# Patient Record
Sex: Female | Born: 1985 | Race: Black or African American | Hispanic: No | Marital: Single | Smoking: Never smoker
Health system: Southern US, Community
[De-identification: ages and names within clinical notes are randomized; demographics above are authoritative.]

## PROBLEM LIST (undated history)

## (undated) DIAGNOSIS — F419 Anxiety disorder, unspecified: Secondary | ICD-10-CM

## (undated) DIAGNOSIS — I10 Essential (primary) hypertension: Secondary | ICD-10-CM

## (undated) DIAGNOSIS — K219 Gastro-esophageal reflux disease without esophagitis: Secondary | ICD-10-CM

## (undated) DIAGNOSIS — A549 Gonococcal infection, unspecified: Secondary | ICD-10-CM

## (undated) DIAGNOSIS — R7303 Prediabetes: Secondary | ICD-10-CM

## (undated) DIAGNOSIS — E119 Type 2 diabetes mellitus without complications: Secondary | ICD-10-CM

## (undated) DIAGNOSIS — E669 Obesity, unspecified: Secondary | ICD-10-CM

## (undated) DIAGNOSIS — N390 Urinary tract infection, site not specified: Secondary | ICD-10-CM

## (undated) DIAGNOSIS — R079 Chest pain, unspecified: Secondary | ICD-10-CM

## (undated) DIAGNOSIS — L0291 Cutaneous abscess, unspecified: Secondary | ICD-10-CM

## (undated) DIAGNOSIS — D649 Anemia, unspecified: Secondary | ICD-10-CM

## (undated) HISTORY — PX: TONSILLECTOMY: SUR1361

## (undated) HISTORY — DX: Morbid (severe) obesity due to excess calories: E66.01

## (undated) HISTORY — PX: DIAGNOSTIC LAPAROSCOPY: SUR761

---
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
## 2006-01-14 ENCOUNTER — Emergency Department (HOSPITAL_COMMUNITY): Admission: EM | Admit: 2006-01-14 | Discharge: 2006-01-14 | Payer: Self-pay | Admitting: Emergency Medicine

## 2006-04-14 ENCOUNTER — Emergency Department (HOSPITAL_COMMUNITY): Admission: EM | Admit: 2006-04-14 | Discharge: 2006-04-14 | Payer: Self-pay | Admitting: Emergency Medicine

## 2006-09-13 ENCOUNTER — Emergency Department (HOSPITAL_COMMUNITY): Admission: EM | Admit: 2006-09-13 | Discharge: 2006-09-13 | Payer: Self-pay | Admitting: Emergency Medicine

## 2007-01-08 ENCOUNTER — Emergency Department (HOSPITAL_COMMUNITY): Admission: EM | Admit: 2007-01-08 | Discharge: 2007-01-08 | Payer: Self-pay | Admitting: Emergency Medicine

## 2007-04-29 ENCOUNTER — Emergency Department (HOSPITAL_COMMUNITY): Admission: EM | Admit: 2007-04-29 | Discharge: 2007-04-29 | Payer: Self-pay | Admitting: Emergency Medicine

## 2007-05-11 ENCOUNTER — Emergency Department (HOSPITAL_COMMUNITY): Admission: EM | Admit: 2007-05-11 | Discharge: 2007-05-11 | Payer: Self-pay | Admitting: Emergency Medicine

## 2007-08-19 ENCOUNTER — Emergency Department (HOSPITAL_COMMUNITY): Admission: EM | Admit: 2007-08-19 | Discharge: 2007-08-19 | Payer: Self-pay | Admitting: Emergency Medicine

## 2009-04-21 ENCOUNTER — Emergency Department (HOSPITAL_COMMUNITY): Admission: EM | Admit: 2009-04-21 | Discharge: 2009-04-22 | Payer: Self-pay | Admitting: Emergency Medicine

## 2009-06-10 ENCOUNTER — Emergency Department (HOSPITAL_COMMUNITY): Admission: EM | Admit: 2009-06-10 | Discharge: 2009-06-11 | Payer: Self-pay | Admitting: Emergency Medicine

## 2009-06-21 ENCOUNTER — Emergency Department (HOSPITAL_COMMUNITY): Admission: EM | Admit: 2009-06-21 | Discharge: 2009-06-21 | Payer: Self-pay | Admitting: Emergency Medicine

## 2009-07-22 ENCOUNTER — Emergency Department (HOSPITAL_COMMUNITY): Admission: EM | Admit: 2009-07-22 | Discharge: 2009-07-22 | Payer: Self-pay | Admitting: Family Medicine

## 2010-06-10 LAB — URINALYSIS, ROUTINE W REFLEX MICROSCOPIC
Bilirubin Urine: NEGATIVE
Ketones, ur: NEGATIVE mg/dL
Protein, ur: NEGATIVE mg/dL
Urobilinogen, UA: 0.2 mg/dL (ref 0.0–1.0)

## 2010-06-10 LAB — URINE MICROSCOPIC-ADD ON

## 2010-06-10 LAB — URINE CULTURE: Colony Count: 70000

## 2010-08-03 NOTE — Discharge Summary (Signed)
Vanessa Morton, Vanessa Morton              ACCOUNT NO.:  [id number]   MEDICAL RECORD NO.:  [id number]          PATIENT TYPE:  INP   LOCATION:  9121                          FACILITY:  [facility]   PHYSICIAN:  Carrington Clamp, M.D. DATE OF BIRTH:  1985/11/21   DATE OF ADMISSION:  07/08/2004  DATE OF DISCHARGE:  07/13/2004                                 DISCHARGE SUMMARY   DISCHARGE DIAGNOSES:  1.  Intrauterine pregnancy at 38-1/[redacted] weeks gestation.  2.  Failure to descend.  3.  Obesity.   PROCEDURE:  Primary low transverse cesarean section.  Surgeon was Randye Lobo, M.D.  Assistant was [employer]. Ambrose Mantle, M.D.   COMPLICATIONS:  None.   HISTORY OF PRESENT ILLNESS:  This 25 year old gravida 1, para 0 was admitted  at 110 weeks gestation with contractions.  The patient's cervix was about 2  cm dilated, 80% effaced, and -1 station and she was contracting regularly  every 4 minutes.  The baby had a reactive and reassuring fetal heart rate  tracing.  When the patient dilated to about 4 cm, AROM was performed with  clear fluid and she received Stadol during the early course of labor and  eventually had epidural.  The patient did receive Pitocin for augmentation  of her labor and did dilate to complete and complete.  She pushed for about  2 hours with no descent below +1 station and there was considerable caput  noted.  At this point the decision was made to proceed with cesarean  section.  Just prior to the patient's surgery a fetal heart rate  deceleration down to the 80's was noted for about 1 minute, but then  recovered to the 120's.   HOSPITAL COURSE:  At this point she was taken to the operating room by Dr.  Edward Jolly where a primary low transverse cesarean section was performed with  delivery of a 7 pound 9 ounce female infant with Apgars of 8 and 9.  Delivery  went without complications.  The patient's postoperative course was  complicated by some early postoperative temperature.  She was started  on  Cefotan until she was afebrile for 24 hours.  The patient was felt ready for  discharge on postoperative day #3.  She was sent home on a regular diet,  told to decrease activities, told to continue her prenatal vitamins and iron  supplement daily.  She was given Percocet one to two every 4 hours as needed  for pain.  Told she could also use over-the-counter ibuprofen up to 600 mg  every 6 hours as needed for pain with follow-up in the office in 4 weeks.   DISCHARGE LABORATORY DATA:  The patient has a hemoglobin of 9.6 and a white  blood cell count of 9.8 and platelets 364,000.       MB/MEDQ  D:  08/23/2004  T:  08/23/2004  Job:  [id number]

## 2010-08-03 NOTE — Op Note (Signed)
NAME:  Vanessa Morton, Vanessa Morton              ACCOUNT NO.:  [id number]   MEDICAL RECORD NO.:  [id number]          PATIENT TYPE:  INP   LOCATION:  9121                          FACILITY:  [facility]   PHYSICIAN:  Randye Lobo, M.D.   DATE OF BIRTH:  May 11, 1985   DATE OF PROCEDURE:  07/09/2004  DATE OF DISCHARGE:                                 OPERATIVE REPORT   PREOPERATIVE DIAGNOSIS:  1.  Intrauterine gestation at 38 +1 weeks.  2.  Failure to descend.   POSTOPERATIVE DIAGNOSES:  1.  Intrauterine gestation at 38 +1 weeks.  2.  Failure to descend.   OPERATION/PROCEDURE:  Primary low segment transverse cesarean section.   SURGEON:  Randye Lobo, M.D.   ASSISTANT:  Malachi Pro. Ambrose Mantle, M.D.   ANESTHESIA:  Epidural with Duramorph.   IV FLUIDS:  600 mL Ringer's lactate.   ESTIMATED BLOOD LOSS:  750 mL.   URINE OUTPUT:  100 mL.   COMPLICATIONS:  None.   INDICATIONS:  The patient is a 25 year old, gravida 1, para 0, African  American female admitted at [redacted] weeks gestation on July 08, 2004 with  contractions.  The patient had cervical dilation of 1-2 cm with 80%  effacement in the vertex at the -1 station.  The patient was having  contractions every one to four minutes and was requesting pain medications.  The patient was found to be in early labor and had a reactive and reassuring  fetal heart rate tracing.   The patient was found to dilate to 4 cm at which time she had artificial  rupture of membranes and clear fluid was noted.  The patient did receive  Stadol during the early course of her labor and eventually had an epidural.  The patient required Pitocin augmentation of her labor and she was able to  achieve complete cervical dilation after which time she pushed for two hours  with no descent of the vertex below the 1+ station.  There was considerable  caput which was noted.  The fetal heart rate tracing had been reactive and  reassuring.  Just prior to beginning the patient's surgery,  there was a  fetal heart rate deceleration down to 80 which lasted for approximately one  minute and then recovered to the 120's.   The patient was given a diagnosis of arrest of descent and a recommendation  was made to proceed with a primary cesarean section after risks, benefits  and alternatives were discussed with the patient and her family in her labor  and delivery suite prior to arriving to the operating room.   FINDINGS:  Viable female was delivered at 0003 a.m. on July 09, 2004.  Apgars  were 8 and one minute and 9 at five minutes.  The patient was noted to be  occiput posterior which was rotated to right occiput transverse for  delivery.  The amniotic fluid was clear.  The newborn was noted to have a  good cry.  The cord pH was later noted to be 7.24.  The uterus, tubes and  ovaries were unremarkable.   SPECIMENS:  The  patient placenta was sent to pathology.   DESCRIPTION OF PROCEDURE:  The patient was taken from her labor and delivery  suite down to the operating room where her epidural was done for surgical  anesthesia.  She was placed in the supine position with a left lateral tilt  and the abdomen was sterilely prepped and draped.  The patient had  previously had a Foley catheter placed inside the bladder.   A Pfannenstiel incision was created sharply with a scalpel and this was  carried down to the fascia using a scalpel.  The scalpel was used to incise  the fascia in the midline and the incision was extended bilaterally with the  Mayo scissors.  The rectus muscles were dissected off the overlying fascia  superiorly and inferiorly and the rectus muscles were then bluntly divided  in the midline.  The parietal peritoneum was elevated with two hemostat  clamps and ultimately was entered bluntly.  The peritoneal incision was  extended sharply both in the cranial and caudal direction using the  Metzenbaum scissors.   The lower uterine segment was exposed with the bladder  retractor and the  bladder flap was then created with a combination of sharp and blunt  dissection.  A transverse lower uterine segment incision was then created  with a scalpel.  Membranes were ruptured and clear fluid was noted.  The  uterine incision was extended bilaterally in an upward fashion using a  bandage scissors.  The hand was inserted through the uterine incision and  the vertex was lifted up and rotated to allow for delivery of the vertex  followed by the delivery of the newborn.  The nares and mouth were suctioned  and the cord was doubly clamped and cut and the newborn was carried over the  awaiting pediatrician.  Both cord blood and cord gas were obtained.  Ancef 1  g IV was administered.  The placenta was manually extracted and the patient  then received Pitocin 20 units IV.   The placenta was sent to pathology.  The uterus was exteriorized at this  time and was cleaned with a moistened lap pad.  The uterine incision in the  lower uterine segment extended inferiorly and down toward the vagina  bilaterally.  The uterine incision was closed with a double layer closure of  #1 chromic.  The first layer was a running locked layer.  The second layer  was an imbricating layer.  There was some bleeding noted along the right  apex of the incision which responded to a figure-of-eight suture of #1  chromic and a figure-of-eight suture of 3-0 Vicryl.   The uterus was returned to the peritoneal cavity which was irrigated and  suctioned.  The uterine incision was noted to be hemostatic.   The abdomen was closed at this time.  The parietal peritoneum was closed  with a running suture of 3-0 Vicryl.  The rectus muscles were reapproximated  in the midline with interrupted sutures of 0 chromic.  The fascia was closed  with a running suture of 0 Vicryl.  The subcutaneous tissue was irrigated  and suctioned and made hemostatic with monopolar cautery.  The skin was closed with staples and  a sterile pressure bandage was placed over this.   This completes the patient's procedure.  There were no complications.  All  needle, instrument, and sponge counts were correct.  The patient was  escorted to the recovery room in stable and awake condition.  BES/MEDQ  D:  07/09/2004  T:  07/09/2004  Job:  [id number]

## 2010-09-06 ENCOUNTER — Emergency Department (HOSPITAL_COMMUNITY)
Admission: EM | Admit: 2010-09-06 | Discharge: 2010-09-06 | Disposition: A | Discharge status: Home / Self Care | Payer: Medicaid Other | Attending: Emergency Medicine | Admitting: Emergency Medicine

## 2010-09-06 DIAGNOSIS — R220 Localized swelling, mass and lump, head: Secondary | ICD-10-CM | POA: Insufficient documentation

## 2010-09-06 DIAGNOSIS — K029 Dental caries, unspecified: Secondary | ICD-10-CM | POA: Insufficient documentation

## 2010-09-06 DIAGNOSIS — I10 Essential (primary) hypertension: Secondary | ICD-10-CM | POA: Insufficient documentation

## 2010-09-06 DIAGNOSIS — K089 Disorder of teeth and supporting structures, unspecified: Secondary | ICD-10-CM | POA: Insufficient documentation

## 2010-09-10 ENCOUNTER — Emergency Department (HOSPITAL_COMMUNITY)
Admission: EM | Admit: 2010-09-10 | Discharge: 2010-09-10 | Disposition: A | Discharge status: Home / Self Care | Payer: Medicaid Other | Attending: Emergency Medicine | Admitting: Emergency Medicine

## 2010-09-10 DIAGNOSIS — I10 Essential (primary) hypertension: Secondary | ICD-10-CM | POA: Insufficient documentation

## 2010-09-10 DIAGNOSIS — K089 Disorder of teeth and supporting structures, unspecified: Secondary | ICD-10-CM | POA: Insufficient documentation

## 2010-09-10 DIAGNOSIS — R220 Localized swelling, mass and lump, head: Secondary | ICD-10-CM | POA: Insufficient documentation

## 2010-09-14 ENCOUNTER — Inpatient Hospital Stay (HOSPITAL_COMMUNITY)
Admission: AD | Admit: 2010-09-14 | Discharge: 2010-09-14 | Disposition: A | Discharge status: Home / Self Care | Payer: Medicaid Other | Source: Ambulatory Visit | Attending: Obstetrics & Gynecology | Admitting: Obstetrics & Gynecology

## 2010-09-14 ENCOUNTER — Inpatient Hospital Stay (HOSPITAL_COMMUNITY): Payer: Medicaid Other

## 2010-09-14 DIAGNOSIS — M545 Low back pain, unspecified: Secondary | ICD-10-CM

## 2010-09-14 DIAGNOSIS — R1032 Left lower quadrant pain: Secondary | ICD-10-CM

## 2010-09-14 DIAGNOSIS — N390 Urinary tract infection, site not specified: Secondary | ICD-10-CM

## 2010-09-14 LAB — URINALYSIS, ROUTINE W REFLEX MICROSCOPIC
Specific Gravity, Urine: >1.03 — ABNORMAL HIGH (ref 1.005–1.030)
Urobilinogen, UA: 0.2 mg/dL (ref 0.0–1.0)

## 2010-09-14 LAB — RAPID URINE DRUG SCREEN, HOSP PERFORMED
Amphetamines: NOT DETECTED
Barbiturates: NOT DETECTED
Tetrahydrocannabinol: NOT DETECTED

## 2010-09-14 LAB — URINE MICROSCOPIC-ADD ON

## 2010-09-14 LAB — WET PREP, GENITAL: Clue Cells Wet Prep HPF POC: NONE SEEN

## 2010-09-14 LAB — CBC
HCT: 36.8 % (ref 36.0–46.0)
MCHC: 32.1 g/dL (ref 30.0–36.0)
MCV: 89.8 fL (ref 78.0–100.0)
RDW: 14 % (ref 11.5–15.5)
WBC: 12 10*3/uL — ABNORMAL HIGH (ref 4.0–10.5)

## 2010-09-16 LAB — URINE CULTURE

## 2010-09-21 ENCOUNTER — Emergency Department (HOSPITAL_COMMUNITY)
Admission: EM | Admit: 2010-09-21 | Discharge: 2010-09-21 | Disposition: A | Discharge status: Home / Self Care | Payer: Medicaid Other | Attending: Emergency Medicine | Admitting: Emergency Medicine

## 2010-09-21 DIAGNOSIS — K089 Disorder of teeth and supporting structures, unspecified: Secondary | ICD-10-CM | POA: Insufficient documentation

## 2010-09-21 DIAGNOSIS — K047 Periapical abscess without sinus: Secondary | ICD-10-CM | POA: Insufficient documentation

## 2010-09-21 DIAGNOSIS — K029 Dental caries, unspecified: Secondary | ICD-10-CM | POA: Insufficient documentation

## 2010-09-24 ENCOUNTER — Emergency Department (HOSPITAL_COMMUNITY)
Admission: EM | Admit: 2010-09-24 | Discharge: 2010-09-24 | Disposition: A | Discharge status: Home / Self Care | Payer: Medicaid Other | Attending: Emergency Medicine | Admitting: Emergency Medicine

## 2010-09-24 DIAGNOSIS — K047 Periapical abscess without sinus: Secondary | ICD-10-CM | POA: Insufficient documentation

## 2010-09-24 DIAGNOSIS — Z79899 Other long term (current) drug therapy: Secondary | ICD-10-CM | POA: Insufficient documentation

## 2010-09-24 DIAGNOSIS — I10 Essential (primary) hypertension: Secondary | ICD-10-CM | POA: Insufficient documentation

## 2010-10-02 ENCOUNTER — Emergency Department (HOSPITAL_COMMUNITY)
Admission: EM | Admit: 2010-10-02 | Discharge: 2010-10-02 | Discharge status: Against Medical Advice | Payer: Medicaid Other | Attending: Emergency Medicine | Admitting: Emergency Medicine

## 2010-10-02 DIAGNOSIS — Z0389 Encounter for observation for other suspected diseases and conditions ruled out: Secondary | ICD-10-CM | POA: Insufficient documentation

## 2010-10-25 ENCOUNTER — Emergency Department (HOSPITAL_COMMUNITY)
Admission: EM | Admit: 2010-10-25 | Discharge: 2010-10-25 | Disposition: A | Discharge status: Home / Self Care | Payer: Medicaid Other | Attending: Emergency Medicine | Admitting: Emergency Medicine

## 2010-10-25 DIAGNOSIS — R300 Dysuria: Secondary | ICD-10-CM | POA: Insufficient documentation

## 2010-10-25 DIAGNOSIS — K089 Disorder of teeth and supporting structures, unspecified: Secondary | ICD-10-CM | POA: Insufficient documentation

## 2010-10-25 DIAGNOSIS — I10 Essential (primary) hypertension: Secondary | ICD-10-CM | POA: Insufficient documentation

## 2010-10-25 DIAGNOSIS — R1032 Left lower quadrant pain: Secondary | ICD-10-CM | POA: Insufficient documentation

## 2010-10-25 LAB — URINE MICROSCOPIC-ADD ON

## 2010-10-25 LAB — URINALYSIS, ROUTINE W REFLEX MICROSCOPIC
Glucose, UA: NEGATIVE mg/dL
Nitrite: NEGATIVE
Specific Gravity, Urine: 1.031 — ABNORMAL HIGH (ref 1.005–1.030)
pH: 6 (ref 5.0–8.0)

## 2010-12-07 LAB — COMPREHENSIVE METABOLIC PANEL
ALT: 17
ALT: 51 — ABNORMAL HIGH
AST: 15
AST: 27
Albumin: 3.8
Alkaline Phosphatase: 56
Alkaline Phosphatase: 75
BUN: 7
Chloride: 107
GFR calc Af Amer: >60
GFR calc Af Amer: >60
Glucose, Bld: 112 — ABNORMAL HIGH
Potassium: 4
Potassium: 3.6
Sodium: 134 — ABNORMAL LOW
Total Bilirubin: 0.6
Total Protein: 7.7

## 2010-12-07 LAB — CBC
HCT: 37.4
Hemoglobin: 14.6
MCHC: 33.2
MCV: 87.8
RBC: 4.26
RBC: 5.59 — ABNORMAL HIGH
RDW: 13.9
WBC: 7.7

## 2010-12-07 LAB — DIFFERENTIAL
Basophils Absolute: 0
Basophils Relative: 0
Basophils Relative: 1
Eosinophils Absolute: 0.1
Eosinophils Absolute: 0
Eosinophils Relative: 2
Eosinophils Relative: 0
Monocytes Absolute: 0.7
Monocytes Absolute: 0 — ABNORMAL LOW
Monocytes Relative: 0 — ABNORMAL LOW
Neutrophils Relative %: 93 — ABNORMAL HIGH

## 2010-12-07 LAB — URINE CULTURE: Colony Count: >=100000

## 2010-12-07 LAB — URINE MICROSCOPIC-ADD ON

## 2010-12-07 LAB — URINALYSIS, ROUTINE W REFLEX MICROSCOPIC
Bilirubin Urine: NEGATIVE
Glucose, UA: NEGATIVE
Ketones, ur: NEGATIVE
Ketones, ur: 15 — AB
Nitrite: NEGATIVE
Protein, ur: NEGATIVE
Urobilinogen, UA: 1

## 2010-12-07 LAB — PREGNANCY, URINE: Preg Test, Ur: NEGATIVE

## 2010-12-13 LAB — RAPID STREP SCREEN (MED CTR MEBANE ONLY): Streptococcus, Group A Screen (Direct): NEGATIVE

## 2011-01-02 LAB — DIFFERENTIAL
Basophils Relative: 0
Eosinophils Absolute: 0.2
Lymphs Abs: 2.6
Monocytes Relative: 6
Neutro Abs: 7.1
Neutrophils Relative %: 67

## 2011-01-02 LAB — D-DIMER, QUANTITATIVE: D-Dimer, Quant: 0.49 — ABNORMAL HIGH

## 2011-01-02 LAB — POCT CARDIAC MARKERS
Myoglobin, poc: 37.3
Operator id: 1211
Troponin i, poc: <0.05

## 2011-01-02 LAB — URINALYSIS, ROUTINE W REFLEX MICROSCOPIC
Bilirubin Urine: NEGATIVE
Glucose, UA: NEGATIVE
Hgb urine dipstick: NEGATIVE
Ketones, ur: NEGATIVE
Specific Gravity, Urine: 1.029
pH: 6

## 2011-01-02 LAB — POCT PREGNANCY, URINE
Operator id: 173591
Preg Test, Ur: NEGATIVE

## 2011-01-02 LAB — URINE MICROSCOPIC-ADD ON

## 2011-01-02 LAB — CBC
MCV: 87.5
Platelets: 383
RBC: 4.25
WBC: 10.6 — ABNORMAL HIGH

## 2011-01-02 LAB — BASIC METABOLIC PANEL
BUN: 8
Calcium: 9.6
Creatinine, Ser: 0.61
GFR calc Af Amer: >60

## 2011-01-05 ENCOUNTER — Emergency Department (HOSPITAL_COMMUNITY)
Admission: EM | Admit: 2011-01-05 | Discharge: 2011-01-05 | Disposition: A | Discharge status: Home / Self Care | Payer: Medicaid Other | Attending: Emergency Medicine | Admitting: Emergency Medicine

## 2011-01-05 ENCOUNTER — Emergency Department (HOSPITAL_COMMUNITY): Payer: Medicaid Other

## 2011-01-05 DIAGNOSIS — K029 Dental caries, unspecified: Secondary | ICD-10-CM | POA: Insufficient documentation

## 2011-01-05 DIAGNOSIS — R10819 Abdominal tenderness, unspecified site: Secondary | ICD-10-CM | POA: Insufficient documentation

## 2011-01-05 DIAGNOSIS — K089 Disorder of teeth and supporting structures, unspecified: Secondary | ICD-10-CM | POA: Insufficient documentation

## 2011-01-05 DIAGNOSIS — N390 Urinary tract infection, site not specified: Secondary | ICD-10-CM | POA: Insufficient documentation

## 2011-01-05 DIAGNOSIS — N949 Unspecified condition associated with female genital organs and menstrual cycle: Secondary | ICD-10-CM | POA: Insufficient documentation

## 2011-01-05 DIAGNOSIS — Z79899 Other long term (current) drug therapy: Secondary | ICD-10-CM | POA: Insufficient documentation

## 2011-01-05 DIAGNOSIS — I10 Essential (primary) hypertension: Secondary | ICD-10-CM | POA: Insufficient documentation

## 2011-01-05 LAB — URINE MICROSCOPIC-ADD ON

## 2011-01-05 LAB — URINALYSIS, ROUTINE W REFLEX MICROSCOPIC
Bilirubin Urine: NEGATIVE
Ketones, ur: 15 mg/dL — AB
Specific Gravity, Urine: 1.027 (ref 1.005–1.030)
pH: 5.5 (ref 5.0–8.0)

## 2011-01-05 LAB — WET PREP, GENITAL: Clue Cells Wet Prep HPF POC: NONE SEEN

## 2011-01-05 LAB — POCT PREGNANCY, URINE: Preg Test, Ur: NEGATIVE

## 2011-01-07 LAB — GC/CHLAMYDIA PROBE AMP, GENITAL: Chlamydia, DNA Probe: NEGATIVE

## 2011-01-08 ENCOUNTER — Emergency Department (HOSPITAL_COMMUNITY)
Admission: EM | Admit: 2011-01-08 | Discharge: 2011-01-08 | Disposition: A | Discharge status: Home / Self Care | Payer: Medicaid Other | Attending: Emergency Medicine | Admitting: Emergency Medicine

## 2011-01-08 DIAGNOSIS — I10 Essential (primary) hypertension: Secondary | ICD-10-CM | POA: Insufficient documentation

## 2011-01-08 DIAGNOSIS — R3915 Urgency of urination: Secondary | ICD-10-CM | POA: Insufficient documentation

## 2011-01-08 DIAGNOSIS — R10816 Epigastric abdominal tenderness: Secondary | ICD-10-CM | POA: Insufficient documentation

## 2011-01-08 DIAGNOSIS — R350 Frequency of micturition: Secondary | ICD-10-CM | POA: Insufficient documentation

## 2011-01-08 DIAGNOSIS — N390 Urinary tract infection, site not specified: Secondary | ICD-10-CM | POA: Insufficient documentation

## 2011-01-08 DIAGNOSIS — R1032 Left lower quadrant pain: Secondary | ICD-10-CM | POA: Insufficient documentation

## 2011-01-08 LAB — COMPREHENSIVE METABOLIC PANEL
BUN: 10 mg/dL (ref 6–23)
Calcium: 9.4 mg/dL (ref 8.4–10.5)
Creatinine, Ser: 0.6 mg/dL (ref 0.50–1.10)
GFR calc Af Amer: >90 mL/min (ref >90)
Glucose, Bld: 121 mg/dL — ABNORMAL HIGH (ref 70–99)
Total Protein: 7.3 g/dL (ref 6.0–8.3)

## 2011-01-08 LAB — DIFFERENTIAL
Basophils Absolute: 0 10*3/uL (ref 0.0–0.1)
Eosinophils Relative: 2 % (ref 0–5)
Lymphocytes Relative: 26 % (ref 12–46)
Neutrophils Relative %: 66 % (ref 43–77)

## 2011-01-08 LAB — URINE MICROSCOPIC-ADD ON

## 2011-01-08 LAB — URINALYSIS, ROUTINE W REFLEX MICROSCOPIC
Nitrite: NEGATIVE
Specific Gravity, Urine: 1.029 (ref 1.005–1.030)
Urobilinogen, UA: 0.2 mg/dL (ref 0.0–1.0)

## 2011-01-08 LAB — CBC
HCT: 35.4 % — ABNORMAL LOW (ref 36.0–46.0)
Platelets: 366 10*3/uL (ref 150–400)
RDW: 14 % (ref 11.5–15.5)
WBC: 9.6 10*3/uL (ref 4.0–10.5)

## 2011-01-08 LAB — LIPASE, BLOOD: Lipase: 22 U/L (ref 11–59)

## 2011-01-08 LAB — POCT PREGNANCY, URINE: Preg Test, Ur: NEGATIVE

## 2011-01-09 LAB — URINE CULTURE

## 2011-02-15 ENCOUNTER — Emergency Department (HOSPITAL_COMMUNITY)
Admission: EM | Admit: 2011-02-15 | Discharge: 2011-02-15 | Disposition: A | Discharge status: Home / Self Care | Payer: Self-pay | Attending: Emergency Medicine | Admitting: Emergency Medicine

## 2011-02-15 ENCOUNTER — Encounter: Payer: Self-pay | Admitting: Emergency Medicine

## 2011-02-15 ENCOUNTER — Emergency Department (HOSPITAL_COMMUNITY): Payer: Self-pay

## 2011-02-15 DIAGNOSIS — R109 Unspecified abdominal pain: Secondary | ICD-10-CM | POA: Insufficient documentation

## 2011-02-15 DIAGNOSIS — R350 Frequency of micturition: Secondary | ICD-10-CM | POA: Insufficient documentation

## 2011-02-15 DIAGNOSIS — R3915 Urgency of urination: Secondary | ICD-10-CM | POA: Insufficient documentation

## 2011-02-15 DIAGNOSIS — N390 Urinary tract infection, site not specified: Secondary | ICD-10-CM

## 2011-02-15 LAB — URINALYSIS, ROUTINE W REFLEX MICROSCOPIC
Bilirubin Urine: NEGATIVE
Glucose, UA: NEGATIVE mg/dL
Ketones, ur: NEGATIVE mg/dL
Nitrite: POSITIVE — AB
Protein, ur: NEGATIVE mg/dL
Specific Gravity, Urine: 1.03 (ref 1.005–1.030)
Urobilinogen, UA: 1 mg/dL (ref 0.0–1.0)
pH: 5.5 (ref 5.0–8.0)

## 2011-02-15 LAB — URINE MICROSCOPIC-ADD ON

## 2011-02-15 LAB — POCT PREGNANCY, URINE: Preg Test, Ur: NEGATIVE

## 2011-02-15 MED ORDER — IBUPROFEN 800 MG PO TABS
ORAL_TABLET | ORAL | Status: AC
  Filled 2011-02-15: qty 1

## 2011-02-15 MED ORDER — NITROFURANTOIN MONOHYD MACRO 100 MG PO CAPS: 100.0000 mg | ORAL_CAPSULE | Freq: Two times a day (BID) | ORAL | Status: DC

## 2011-02-15 MED ORDER — IBUPROFEN 800 MG PO TABS
800.0000 mg | ORAL_TABLET | Freq: Once | ORAL | Status: AC
  Administered 2011-02-15: 800 mg via ORAL

## 2011-02-15 MED ORDER — KETOROLAC TROMETHAMINE 60 MG/2ML IM SOLN
60.0000 mg | Freq: Once | INTRAMUSCULAR | Status: DC
  Filled 2011-02-15: qty 2

## 2011-02-15 MED ORDER — HYDROCODONE-ACETAMINOPHEN 5-325 MG PO TABS: 1.0000 | ORAL_TABLET | Freq: Four times a day (QID) | ORAL | Status: DC | PRN

## 2011-02-15 MED ORDER — OXYCODONE-ACETAMINOPHEN 5-325 MG PO TABS
1.0000 | ORAL_TABLET | Freq: Once | ORAL | Status: AC
  Administered 2011-02-15: 1 via ORAL
  Filled 2011-02-15: qty 1

## 2011-02-15 MED ORDER — HYDROCODONE-ACETAMINOPHEN 5-325 MG PO TABS
2.0000 | ORAL_TABLET | Freq: Once | ORAL | Status: AC
  Administered 2011-02-15: 2 via ORAL
  Filled 2011-02-15: qty 2

## 2011-02-15 NOTE — ED Provider Notes (Signed)
5:45 PM Patient care resumed from [facility] and Dr. Oletta Lamas.  Results of complete abdominal and pelvic ultrasound was discussed both with Dr. Oletta Lamas & the patient.  There was no evidence of ovarian torsion, cyst, or abscesses.  Patient will be discharged home with antibiotics for her UTI, painkillers, and instructions to followup with OB/GYN next week.  In addition a culture was sent for the patient's urinary tract infection after being informed that she often gets these infections and antibiotics sometimes do not work.  [place], [place] 02/15/11 [phone]

## 2011-02-15 NOTE — ED Notes (Signed)
Report to [place], Charity fundraiser in CDU. To be returned to CDU room 8 upon completion of ultrasound. Called & notified.

## 2011-02-15 NOTE — ED Provider Notes (Signed)
History     CSN: [id number] Arrival date & time: 02/15/2011  1:23 PM   First MD Initiated Contact with Patient 02/15/11 1352      Chief Complaint  Patient presents with  . Abdominal Pain    (Consider location/radiation/quality/duration/timing/severity/associated sxs/prior treatment) HPI Comments: Patient reports approximately one week of gradually worsening left-sided abdominal pain associated with urinary frequency and she also reports her urine seems darker and smells with a strange odor. She has had a urinary tract infection in the past and reports it feels similar. She's been taking ibuprofen which was helping initially but now doesn't seem to be doing much for her discomfort. She's also been taking over-the-counter urinary medication, probably AZO without seeing any improvement. She denies fevers, chills, nausea or vomiting. She's had no change to bowel or bladder. She denies any recent sexual contact in the last few months. She denies any vaginal bleeding or discharge and her last menstrual period was at the beginning of November. She reports pain does radiate a little bit into her left flank region as well. There is no skin rash  Patient is a 25 y.o. female presenting with abdominal pain. The history is provided by the patient.  Abdominal Pain The primary symptoms of the illness include abdominal pain. The primary symptoms of the illness do not include fever, shortness of breath, dysuria, vaginal discharge or vaginal bleeding.  Additional symptoms associated with the illness include urgency and frequency. Symptoms associated with the illness do not include chills or back pain.    History reviewed. No pertinent past medical history.  History reviewed. No pertinent past surgical history.  History reviewed. No pertinent family history.  History  Substance Use Topics  . Smoking status: Never Smoker   . Smokeless tobacco: Not on file  . Alcohol Use: No    OB History    Grav  Para Term Preterm Abortions TAB SAB Ect Mult Living                  Review of Systems  Constitutional: Negative for fever and chills.  Respiratory: Negative for shortness of breath.   Cardiovascular: Negative for chest pain.  Gastrointestinal: Positive for abdominal pain.  Genitourinary: Positive for urgency, frequency and flank pain. Negative for dysuria, vaginal bleeding, vaginal discharge and vaginal pain.  Musculoskeletal: Negative for back pain.  Neurological: Negative for weakness, light-headedness and headaches.  All other systems reviewed and are negative.    Allergies  Review of patient's allergies indicates no known allergies.  Home Medications   Current Outpatient Rx  Name Route Sig Dispense Refill  . IBUPROFEN 800 MG PO TABS Oral Take 800 mg by mouth every 8 (eight) hours as needed. For pain       BP 145/98  Pulse 97  Temp(Src) 97.8 F (36.6 C) (Oral)  Resp 18  SpO2 96%  Physical Exam  Nursing note and vitals reviewed. Constitutional: She is oriented to person, place, and time. She appears well-developed and well-nourished.  HENT:  Head: Normocephalic and atraumatic.  Eyes: Pupils are equal, round, and reactive to light.  Cardiovascular: Normal rate and intact distal pulses.   Pulmonary/Chest: Effort normal. No respiratory distress. She has no wheezes. She has no rales.  Abdominal: Soft. Normal appearance. There is CVA tenderness. There is no rebound. No hernia.       Patient is morbidly obese, therefore physical examination is somewhat limited of her abdomen.  Neurological: She is alert and oriented to person, place, and time.  Skin: No rash noted.  Psychiatric: She has a normal mood and affect.    ED Course  Procedures (including critical care time)  Labs Reviewed  URINALYSIS, ROUTINE W REFLEX MICROSCOPIC - Abnormal; Notable for the following:    Color, Urine ORANGE (*) BIOCHEMICALS MAY BE AFFECTED BY COLOR   APPearance TURBID (*)    Hgb urine  dipstick SMALL (*)    Nitrite POSITIVE (*)    Leukocytes, UA SMALL (*)    All other components within normal limits  URINE MICROSCOPIC-ADD ON - Abnormal; Notable for the following:    Squamous Epithelial / LPF MANY (*)    Bacteria, UA FEW (*)    All other components within normal limits  POCT PREGNANCY, URINE  POCT PREGNANCY, URINE  URINE CULTURE   No results found.   No diagnosis found.    MDM    Patient reports that her pain feels similar to a urinary tract infection which she has had before. Her urinalysis today is contaminated with many epithelials, however she only has 3-6 white cells which makes me not think this is related to a UTI. She now tells me that she was diagnosed with an ovarian cyst in the past. In light of her prior history, I feel her symptoms may be related to ovarian cysts. She does not have nausea vomiting does not appear to be a colicky discomfort therefore I have lower suspicion for ovarian torsion. However given her body habitus and difficulty with examination I think she should have a ultrasound to measure Doppler flow to her left ovary dentures she does not have torsion. Patient is in agreement with this plan. We'll Place in CDU to await results of her ultrasound and will readdress the possibility of trying to perform a pelvic examination for culture testing.      Pt's care transferred to [facility] in CDU for continued care.  Gavin Pound. Oletta Lamas, MD 02/16/11 [phone]

## 2011-02-15 NOTE — ED Notes (Signed)
C/o low back,  LLQ, suprapubic pain x 1 week worsening past few days. + urinary frequency, denies dysuria, hematuria, n/v/d, vaginal discharge, fever. States has been taking OTC UTI medicine with no relief

## 2011-02-15 NOTE — ED Notes (Signed)
Transported to ultrasound

## 2011-02-15 NOTE — ED Notes (Signed)
Pt c/o lower abd pain and lower back pain x 1 week; pt sts strong odor to urine; pt sts hx of similar when had UTI; pt denies burning or blood in urine

## 2011-02-16 NOTE — ED Provider Notes (Signed)
Medical screening examination/treatment/procedure(s) were performed by non-physician practitioner and as supervising physician I was immediately available for consultation/collaboration.  Socrates Cahoon P Jacquees Gongora, MD 02/16/11 0723 

## 2011-02-17 LAB — URINE CULTURE
Colony Count: 95000
Culture  Setup Time: 201212012012

## 2011-02-22 ENCOUNTER — Emergency Department (HOSPITAL_COMMUNITY)
Admission: EM | Admit: 2011-02-22 | Discharge: 2011-02-22 | Disposition: A | Discharge status: Home / Self Care | Payer: Self-pay | Attending: Emergency Medicine | Admitting: Emergency Medicine

## 2011-02-22 ENCOUNTER — Encounter (HOSPITAL_COMMUNITY): Payer: Self-pay | Admitting: *Deleted

## 2011-02-22 DIAGNOSIS — L0211 Cutaneous abscess of neck: Secondary | ICD-10-CM | POA: Insufficient documentation

## 2011-02-22 DIAGNOSIS — L03221 Cellulitis of neck: Secondary | ICD-10-CM | POA: Insufficient documentation

## 2011-02-22 MED ORDER — HYDROCODONE-ACETAMINOPHEN 5-325 MG PO TABS: 1.0000 | ORAL_TABLET | ORAL | Status: DC | PRN

## 2011-02-22 MED ORDER — SULFAMETHOXAZOLE-TRIMETHOPRIM 800-160 MG PO TABS: 1.0000 | ORAL_TABLET | Freq: Two times a day (BID) | ORAL | Status: DC

## 2011-02-22 MED ORDER — OXYCODONE-ACETAMINOPHEN 5-325 MG PO TABS
1.0000 | ORAL_TABLET | Freq: Once | ORAL | Status: AC
  Administered 2011-02-22: 1 via ORAL
  Filled 2011-02-22: qty 1

## 2011-02-22 NOTE — ED Notes (Signed)
Patient reports onset of bump and swelling and stiffness in her neck.  She has hx of abcess that has been drained in the past.

## 2011-02-22 NOTE — ED Provider Notes (Signed)
History     CSN: [id number] Arrival date & time: 02/22/2011 11:32 AM   First MD Initiated Contact with Patient 02/22/11 1223      Chief Complaint  Patient presents with  . Wound Check    (Consider location/radiation/quality/duration/timing/severity/associated sxs/prior treatment) Patient is a 25 y.o. female presenting with abscess. The history is provided by the patient. No language interpreter was used.  Abscess  This is a recurrent problem. The current episode started more than one week ago. The problem occurs occasionally. The problem has been gradually worsening. The abscess is present on the neck. The problem is moderate. The abscess is characterized by painfulness. The abscess first occurred at home.  Patient has a history of recurrent abscesses.  History reviewed. No pertinent past medical history.  Past Surgical History  Procedure Date  . Cesarean section   . Tonsillectomy     No family history on file.  History  Substance Use Topics  . Smoking status: Never Smoker   . Smokeless tobacco: Not on file  . Alcohol Use: No    OB History    Grav Para Term Preterm Abortions TAB SAB Ect Mult Living                  Review of Systems  All other systems reviewed and are negative.    Allergies  Review of patient's allergies indicates no known allergies.  Home Medications   Current Outpatient Rx  Name Route Sig Dispense Refill  . IBUPROFEN 800 MG PO TABS Oral Take 800 mg by mouth every 6 (six) hours as needed. For pain    . OVER THE COUNTER MEDICATION Both Eyes Place 2-4 drops into both eyes daily as needed. Eye drops. For dryness       BP 131/69  Pulse 101  Temp(Src) 98.4 F (36.9 C) (Oral)  Resp 20  SpO2 100%  Physical Exam  Constitutional: She is oriented to person, place, and time. She appears well-developed and well-nourished.  HENT:  Head: Normocephalic.  Eyes: Conjunctivae are normal. Pupils are equal, round, and reactive to light.  Neck:  Normal range of motion. Neck supple.  Cardiovascular: Normal rate, regular rhythm and normal heart sounds.   Pulmonary/Chest: Effort normal and breath sounds normal.  Abdominal: Soft. Bowel sounds are normal.  Musculoskeletal: Normal range of motion.  Neurological: She is alert and oriented to person, place, and time.  Skin: Skin is warm and dry.       ED Course  INCISION AND DRAINAGE Performed by: Jimmye Norman Authorized by: Jimmye Norman Consent: Verbal consent obtained. Written consent not obtained. Risks and benefits: risks, benefits and alternatives were discussed Consent given by: patient Patient understanding: patient states understanding of the procedure being performed Patient consent: the patient's understanding of the procedure matches consent given Required items: required blood products, implants, devices, and special equipment available Patient identity confirmed: verbally with patient Time out: Immediately prior to procedure a "time out" was called to verify the correct patient, procedure, equipment, support staff and site/side marked as required. Type: abscess Body area: head/neck Anesthesia: local infiltration Local anesthetic: lidocaine 2% without epinephrine Patient sedated: no Needle gauge: 18 Complexity: simple Drainage amount: scant Wound treatment: wound left open Comments: No purulent drainage noted upon needle insertion--   (including critical care time)  Labs Reviewed - No data to display No results found.   No diagnosis found.    MDM          Carlena Bjornstad  Katrinka Blazing, NP 02/22/11 2140  Jimmye Norman, NP 02/22/11 2141

## 2011-02-25 NOTE — ED Provider Notes (Signed)
Medical screening examination/treatment/procedure(s) were performed by non-physician practitioner and as supervising physician I was immediately available for consultation/collaboration.   Gerhard Munch, MD 02/25/11 [phone]

## 2011-02-27 ENCOUNTER — Emergency Department (HOSPITAL_COMMUNITY)
Admission: EM | Admit: 2011-02-27 | Discharge: 2011-02-27 | Disposition: A | Discharge status: Home / Self Care | Payer: Self-pay | Attending: Emergency Medicine | Admitting: Emergency Medicine

## 2011-02-27 ENCOUNTER — Encounter (HOSPITAL_COMMUNITY): Payer: Self-pay

## 2011-02-27 DIAGNOSIS — L0211 Cutaneous abscess of neck: Secondary | ICD-10-CM | POA: Insufficient documentation

## 2011-02-27 MED ORDER — HYDROCODONE-ACETAMINOPHEN 5-325 MG PO TABS: 1.0000 | ORAL_TABLET | Freq: Four times a day (QID) | ORAL | Status: DC | PRN

## 2011-02-27 NOTE — ED Provider Notes (Signed)
Medical screening examination/treatment/procedure(s) were performed by non-physician practitioner and as supervising physician I was immediately available for consultation/collaboration.   Glynn Octave, MD 02/27/11 [phone]

## 2011-02-27 NOTE — ED Provider Notes (Signed)
History     CSN: [id number] Arrival date & time: 02/27/2011  7:56 AM   First MD Initiated Contact with Patient 02/27/11 [phone]      Chief Complaint  Patient presents with  . Abscess    (Consider location/radiation/quality/duration/timing/severity/associated sxs/prior treatment) The history is provided by the patient.   patient presents to the emergency department with a four-day history of abscess to the posterior lower cervical region.  She states she was here Friday and was advised to return here if the abscess began to drain.  She was given antibiotics and pain control at that time.  She states she has not had any fever, nausea, and vomiting, weakness, or stiff neck.   History reviewed. No pertinent past medical history.  Past Surgical History  Procedure Date  . Cesarean section   . Tonsillectomy     History reviewed. No pertinent family history.  History  Substance Use Topics  . Smoking status: Never Smoker   . Smokeless tobacco: Not on file  . Alcohol Use: No    OB History    Grav Para Term Preterm Abortions TAB SAB Ect Mult Living                  Review of Systems All pertinent positives and negatives in the history of present illness  Allergies  Review of patient's allergies indicates no known allergies.  Home Medications   Current Outpatient Rx  Name Route Sig Dispense Refill  . HYDROCODONE-ACETAMINOPHEN 5-325 MG PO TABS Oral Take 1 tablet by mouth every 4 (four) hours as needed for pain. 20 tablet 0  . IBUPROFEN 800 MG PO TABS Oral Take 800 mg by mouth every 6 (six) hours as needed. For pain    . OPTI-FREE SUPRA CLENS SOLN Does not apply 2 drops by Does not apply route daily as needed. For contact lens discomfort      BP 152/102  Pulse 108  Temp(Src) 98.2 F (36.8 C) (Oral)  Resp 22  Ht 5\' 1"  (1.549 m)  Wt 305 lb (138.347 kg)  BMI 57.63 kg/m2  SpO2 100%  Physical Exam  Constitutional: She appears well-developed and well-nourished. No  distress.  HENT:  Head: Normocephalic and atraumatic.  Cardiovascular: Normal rate and regular rhythm.   Pulmonary/Chest: Effort normal and breath sounds normal.  Skin: Skin is warm and dry.       ED Course  Procedures (including critical care time)  INCISION AND DRAINAGE Performed by: Carlyle Dolly Consent: Verbal consent obtained. Risks and benefits: risks, benefits and alternatives were discussed Type: abscess  Body area: MId lower cervical region of the neck  Anesthesia: local infiltration  Local anesthetic: lidocaine2%   Anesthetic total: 8 ml  Complexity: complex Blunt dissection to break up loculations  Drainage: purulent  Drainage amount:moderate  Packing material: 1/4 in iodoform gauze  Patient tolerance: Patient tolerated the procedure well with no immediate complications.    Patient is advised to use warm compresses around the area.  She is told to return here in 2 days for recheck.      MDM  Abscess to the lower mid posterior neck.        Carlyle Dolly, PA-C 02/27/11 [phone]

## 2011-02-27 NOTE — ED Notes (Signed)
Pt. Has an abcess on  her posterior neck.  Was here this past Friday and was told if it began to drain, to come back.  The abscess began draining, red and swollen

## 2011-03-02 ENCOUNTER — Emergency Department (HOSPITAL_COMMUNITY)
Admission: EM | Admit: 2011-03-02 | Discharge: 2011-03-02 | Disposition: A | Discharge status: Home / Self Care | Payer: Self-pay | Attending: Emergency Medicine | Admitting: Emergency Medicine

## 2011-03-02 ENCOUNTER — Encounter (HOSPITAL_COMMUNITY): Payer: Self-pay | Admitting: *Deleted

## 2011-03-02 DIAGNOSIS — L03221 Cellulitis of neck: Secondary | ICD-10-CM | POA: Insufficient documentation

## 2011-03-02 DIAGNOSIS — L0291 Cutaneous abscess, unspecified: Secondary | ICD-10-CM

## 2011-03-02 DIAGNOSIS — M542 Cervicalgia: Secondary | ICD-10-CM | POA: Insufficient documentation

## 2011-03-02 DIAGNOSIS — L0211 Cutaneous abscess of neck: Secondary | ICD-10-CM | POA: Insufficient documentation

## 2011-03-02 DIAGNOSIS — Z09 Encounter for follow-up examination after completed treatment for conditions other than malignant neoplasm: Secondary | ICD-10-CM | POA: Insufficient documentation

## 2011-03-02 NOTE — ED Notes (Signed)
Patient states she is here to have packing removed from abscess on back on neck

## 2011-03-02 NOTE — ED Notes (Signed)
Patient here for re check on her abcess. Was here Wednesday and was told to come in for recheck

## 2011-03-02 NOTE — ED Provider Notes (Signed)
Medical screening examination/treatment/procedure(s) were performed by non-physician practitioner and as supervising physician I was immediately available for consultation/collaboration.    Nelia Shi, MD 03/02/11 [phone]

## 2011-03-02 NOTE — ED Provider Notes (Signed)
History     CSN: [id number] Arrival date & time: 03/02/2011  3:23 PM   First MD Initiated Contact with Patient 03/02/11 1607      Chief Complaint  Patient presents with  . Wound Check    (Consider location/radiation/quality/duration/timing/severity/associated sxs/prior treatment) HPI  Patient presents to ER requesting wound recheck of previous I&D of abscess of posterior neck that was performed 4 days ago and returns per instructions. Patient states she did not remove packing but believes if fell out. Patient states mild ongoing soreness but states "pain is much better." denies fevers, chills, stiffneck, HA, drainage from wound. Mild pain is aggravated by movement of neck and improved with keeping neck still and pain meds. Patient believes wound is healing well.   History reviewed. No pertinent past medical history.  Past Surgical History  Procedure Date  . Cesarean section   . Tonsillectomy     History reviewed. No pertinent family history.  History  Substance Use Topics  . Smoking status: Never Smoker   . Smokeless tobacco: Not on file  . Alcohol Use: No    OB History    Grav Para Term Preterm Abortions TAB SAB Ect Mult Living                  Review of Systems  All other systems reviewed and are negative.    Allergies  Review of patient's allergies indicates no known allergies.  Home Medications   Current Outpatient Rx  Name Route Sig Dispense Refill  . HYDROCODONE-ACETAMINOPHEN 5-325 MG PO TABS Oral Take 1 tablet by mouth every 6 (six) hours as needed. For pain.     . IBUPROFEN 800 MG PO TABS Oral Take 800 mg by mouth every 6 (six) hours as needed. For pain    . OPTI-FREE SUPRA CLENS SOLN Does not apply 2 drops by Does not apply route daily as needed. For contact lens discomfort      BP 116/57  Pulse 114  Temp(Src) 98.4 F (36.9 C) (Oral)  Resp 18  SpO2 97%  Physical Exam  Nursing note and vitals reviewed. Constitutional: She is oriented to  person, place, and time. She appears well-developed and well-nourished. No distress.  HENT:  Head: Normocephalic and atraumatic.  Eyes: Conjunctivae are normal.  Neck: Normal range of motion. Neck supple.  Cardiovascular: Normal rate.   Pulmonary/Chest: Effort normal.  Musculoskeletal: Normal range of motion. She exhibits no edema and no tenderness.  Lymphadenopathy:    She has no cervical adenopathy.  Neurological: She is alert and oriented to person, place, and time.  Skin: Skin is warm and dry. She is not diaphoretic.       1cm shallow wound opening of posterior lower neck with granulation tissue but no drainage. No surrounding soft tissue erythema or TTP. No crepitous. No packing in place.     ED Course  Procedures (including critical care time)  Labs Reviewed - No data to display No results found.   1. Abscess       MDM  Well healing prior I&D of abscess without signs or symptoms or recurrent abscess, cellulitis with granulation tissue but no drainage. Afebrile. Nontoxic appearing.         Jenness Corner, [place] 03/02/11 [phone]

## 2011-03-27 DIAGNOSIS — L02219 Cutaneous abscess of trunk, unspecified: Secondary | ICD-10-CM | POA: Insufficient documentation

## 2011-03-27 DIAGNOSIS — M545 Low back pain, unspecified: Secondary | ICD-10-CM | POA: Insufficient documentation

## 2011-03-27 DIAGNOSIS — I10 Essential (primary) hypertension: Secondary | ICD-10-CM | POA: Insufficient documentation

## 2011-03-28 ENCOUNTER — Encounter (HOSPITAL_COMMUNITY): Payer: Self-pay | Admitting: *Deleted

## 2011-03-28 ENCOUNTER — Emergency Department (HOSPITAL_COMMUNITY)
Admission: EM | Admit: 2011-03-28 | Discharge: 2011-03-28 | Disposition: A | Discharge status: Home / Self Care | Payer: Self-pay | Attending: Emergency Medicine | Admitting: Emergency Medicine

## 2011-03-28 DIAGNOSIS — M545 Low back pain, unspecified: Secondary | ICD-10-CM

## 2011-03-28 DIAGNOSIS — L02211 Cutaneous abscess of abdominal wall: Secondary | ICD-10-CM

## 2011-03-28 HISTORY — DX: Urinary tract infection, site not specified: N39.0

## 2011-03-28 HISTORY — DX: Cutaneous abscess, unspecified: L02.91

## 2011-03-28 HISTORY — DX: Essential (primary) hypertension: I10

## 2011-03-28 LAB — POCT I-STAT, CHEM 8
BUN: 13 mg/dL (ref 6–23)
Calcium, Ion: 1.22 mmol/L (ref 1.12–1.32)
Chloride: 106 mEq/L (ref 96–112)
Glucose, Bld: 108 mg/dL — ABNORMAL HIGH (ref 70–99)
TCO2: 26 mmol/L (ref 0–100)

## 2011-03-28 LAB — DIFFERENTIAL
Basophils Absolute: 0 10*3/uL (ref 0.0–0.1)
Eosinophils Absolute: 0.2 10*3/uL (ref 0.0–0.7)
Eosinophils Relative: 1 % (ref 0–5)
Lymphocytes Relative: 28 % (ref 12–46)
Monocytes Absolute: 0.6 10*3/uL (ref 0.1–1.0)

## 2011-03-28 LAB — CBC
HCT: 33.3 % — ABNORMAL LOW (ref 36.0–46.0)
MCH: 28.8 pg (ref 26.0–34.0)
MCV: 89.5 fL (ref 78.0–100.0)
RDW: 14.3 % (ref 11.5–15.5)
WBC: 11.1 10*3/uL — ABNORMAL HIGH (ref 4.0–10.5)

## 2011-03-28 LAB — URINALYSIS, ROUTINE W REFLEX MICROSCOPIC
Bilirubin Urine: NEGATIVE
Glucose, UA: NEGATIVE mg/dL
Ketones, ur: NEGATIVE mg/dL
Protein, ur: NEGATIVE mg/dL
pH: 5.5 (ref 5.0–8.0)

## 2011-03-28 MED ORDER — HYDROCODONE-ACETAMINOPHEN 5-325 MG PO TABS
1.0000 | ORAL_TABLET | Freq: Four times a day (QID) | ORAL | Status: AC | PRN
Start: 2011-03-28 — End: 2011-04-07

## 2011-03-28 MED ORDER — DOXYCYCLINE HYCLATE 100 MG PO TABS: 100.0000 mg | ORAL_TABLET | Freq: Two times a day (BID) | ORAL | Status: DC

## 2011-03-28 MED ORDER — DOXYCYCLINE HYCLATE 100 MG PO CAPS: 100.0000 mg | ORAL_CAPSULE | Freq: Two times a day (BID) | ORAL | Status: AC

## 2011-03-28 NOTE — ED Notes (Signed)
C/o low abdominal pain and low back pain.  Also c/o abcess

## 2011-03-28 NOTE — ED Notes (Signed)
Patient to pto 4 at [facility]

## 2011-03-28 NOTE — ED Notes (Signed)
C/o abd & back pain, ongoing since being tx'd for UTI in November, "sx never fully went away", has been seen here for abscesses since then & is not sure new ones aren't developing. (denies fever), reports urinary frequency.

## 2011-03-28 NOTE — ED Notes (Signed)
Pt. Asked to void for UA test, and advised staff that she had asked for water to drink and had not gotten any water. Pt states she is not able to void until she is able to get something to drink.

## 2011-03-28 NOTE — ED Notes (Signed)
Ambulated to restroom  

## 2011-03-28 NOTE — ED Notes (Signed)
MD at bedside. Dr. Molpus 

## 2011-03-28 NOTE — ED Provider Notes (Signed)
History     CSN: [id number]  Arrival date & time 03/27/11  2338   First MD Initiated Contact with Patient 03/28/11 [phone]      Chief Complaint  Patient presents with  . Back Pain    (Consider location/radiation/quality/duration/timing/severity/associated sxs/prior treatment) HPI This is a 26 year old black female with a one and a half week history of low back pain. The pain is located across the lower back. It is moderate in severity and worse with movement. It has not been relieved by over-the-counter medications. The symptoms are similar to previous urinary tract infections, which she frequently gets. She denies dysuria or frequency. She denies vaginal bleeding or discharge. She does think she may be developing an abscess in the fold of her abdominal pannus about midline. She has had abscesses before. She denies fevers, chills, nausea, vomiting or diarrhea.  Past Medical History  Diagnosis Date  . Hypertension   . Urinary tract infection   . Abscess     Past Surgical History  Procedure Date  . Cesarean section   . Tonsillectomy     Family History  Problem Relation Age of Onset  . Hypertension Mother   . Diabetes Mother   . Diabetes Other   . Hypertension Other     History  Substance Use Topics  . Smoking status: Never Smoker   . Smokeless tobacco: Not on file  . Alcohol Use: No    OB History    Grav Para Term Preterm Abortions TAB SAB Ect Mult Living                  Review of Systems  All other systems reviewed and are negative.    Allergies  Review of patient's allergies indicates no known allergies.  Home Medications   Current Outpatient Rx  Name Route Sig Dispense Refill  . ACETAMINOPHEN 500 MG PO TABS Oral Take 750 mg by mouth every 6 (six) hours as needed. For pain    . IBUPROFEN 200 MG PO TABS Oral Take 800 mg by mouth every 6 (six) hours as needed. For pain    . OVER THE COUNTER MEDICATION Both Eyes Place 2 drops into both eyes as needed. For  dry eyes/dry contact leses      BP 136/78  Pulse 89  Temp(Src) 97.9 F (36.6 C) (Oral)  Resp 20  SpO2 99%  LMP 02/25/2011  Physical Exam General: Well-developed, well-nourished, morbidly obese female in no acute distress; appearance consistent with age of record HENT: normocephalic, atraumatic Eyes: pupils equal round and reactive to light; extraocular muscles intact Neck: supple Heart: regular rate and rhythm Lungs: clear to auscultation bilaterally Abdomen: soft; obese; nontender; bowel sounds present Extremities: No deformity; full range of motion; pulses normal Neurologic: Awake, alert and oriented; motor function intact in all extremities and symmetric; no facial droop Skin: Warm and dry; tender indurated area of the planned of the abdominal pannus just right of midline, without fluctuance or pointing Psychiatric: Normal mood and affect    ED Course  Procedures (including critical care time)     MDM   Nursing notes and vitals signs, including pulse oximetry, reviewed.  Summary of this visit's results, reviewed by myself:  Labs:  Results for orders placed during the hospital encounter of 03/28/11  CBC      Component Value Range   WBC 11.1 (*) 4.0 - 10.5 (K/uL)   RBC 3.72 (*) 3.87 - 5.11 (MIL/uL)   Hemoglobin 10.7 (*) 12.0 - 15.0 (g/dL)  HCT 33.3 (*) 36.0 - 46.0 (%)   MCV 89.5  78.0 - 100.0 (fL)   MCH 28.8  26.0 - 34.0 (pg)   MCHC 32.1  30.0 - 36.0 (g/dL)   RDW 16.1  09.6 - 04.5 (%)   Platelets 373  150 - 400 (K/uL)  DIFFERENTIAL      Component Value Range   Neutrophils Relative 65  43 - 77 (%)   Neutro Abs 7.2  1.7 - 7.7 (K/uL)   Lymphocytes Relative 28  12 - 46 (%)   Lymphs Abs 3.1  0.7 - 4.0 (K/uL)   Monocytes Relative 5  3 - 12 (%)   Monocytes Absolute 0.6  0.1 - 1.0 (K/uL)   Eosinophils Relative 1  0 - 5 (%)   Eosinophils Absolute 0.2  0.0 - 0.7 (K/uL)   Basophils Relative 0  0 - 1 (%)   Basophils Absolute 0.0  0.0 - 0.1 (K/uL)  URINALYSIS,  ROUTINE W REFLEX MICROSCOPIC      Component Value Range   Color, Urine YELLOW  YELLOW    APPearance CLEAR  CLEAR    Specific Gravity, Urine 1.030  1.005 - 1.030    pH 5.5  5.0 - 8.0    Glucose, UA NEGATIVE  NEGATIVE (mg/dL)   Hgb urine dipstick NEGATIVE  NEGATIVE    Bilirubin Urine NEGATIVE  NEGATIVE    Ketones, ur NEGATIVE  NEGATIVE (mg/dL)   Protein, ur NEGATIVE  NEGATIVE (mg/dL)   Urobilinogen, UA 0.2  0.0 - 1.0 (mg/dL)   Nitrite NEGATIVE  NEGATIVE    Leukocytes, UA NEGATIVE  NEGATIVE   POCT I-STAT, CHEM 8      Component Value Range   Sodium 141  135 - 145 (mEq/L)   Potassium 3.7  3.5 - 5.1 (mEq/L)   Chloride 106  96 - 112 (mEq/L)   BUN 13  6 - 23 (mg/dL)   Creatinine, Ser 4.09  0.50 - 1.10 (mg/dL)   Glucose, Bld 811 (*) 70 - 99 (mg/dL)   Calcium, Ion 9.14  7.82 - 1.32 (mmol/L)   TCO2 26  0 - 100 (mmol/L)   Hemoglobin 11.9 (*) 12.0 - 15.0 (g/dL)   HCT 95.6 (*) 21.3 - 46.0 (%)  POCT PREGNANCY, URINE      Component Value Range   Preg Test, Ur NEGATIVE     We'll treat for early abscess. I&D is not indicated at this time.          Hanley Seamen, MD 03/28/11 [phone]

## 2011-03-28 NOTE — ED Notes (Signed)
Moved to room 60for md eval.

## 2011-04-10 ENCOUNTER — Other Ambulatory Visit: Payer: Self-pay

## 2011-04-10 ENCOUNTER — Emergency Department (HOSPITAL_COMMUNITY): Payer: Self-pay

## 2011-04-10 ENCOUNTER — Encounter (HOSPITAL_COMMUNITY): Payer: Self-pay | Admitting: *Deleted

## 2011-04-10 ENCOUNTER — Emergency Department (HOSPITAL_COMMUNITY)
Admission: EM | Admit: 2011-04-10 | Discharge: 2011-04-10 | Disposition: A | Discharge status: Home / Self Care | Payer: Self-pay | Attending: Emergency Medicine | Admitting: Emergency Medicine

## 2011-04-10 DIAGNOSIS — R0602 Shortness of breath: Secondary | ICD-10-CM | POA: Insufficient documentation

## 2011-04-10 DIAGNOSIS — Z7982 Long term (current) use of aspirin: Secondary | ICD-10-CM | POA: Insufficient documentation

## 2011-04-10 DIAGNOSIS — R0789 Other chest pain: Secondary | ICD-10-CM | POA: Insufficient documentation

## 2011-04-10 DIAGNOSIS — R1013 Epigastric pain: Secondary | ICD-10-CM | POA: Insufficient documentation

## 2011-04-10 DIAGNOSIS — M7989 Other specified soft tissue disorders: Secondary | ICD-10-CM | POA: Insufficient documentation

## 2011-04-10 DIAGNOSIS — I10 Essential (primary) hypertension: Secondary | ICD-10-CM | POA: Insufficient documentation

## 2011-04-10 DIAGNOSIS — R609 Edema, unspecified: Secondary | ICD-10-CM | POA: Insufficient documentation

## 2011-04-10 HISTORY — DX: Obesity, unspecified: E66.9

## 2011-04-10 LAB — POCT I-STAT, CHEM 8
BUN: 10 mg/dL (ref 6–23)
Chloride: 105 mEq/L (ref 96–112)
Creatinine, Ser: 0.7 mg/dL (ref 0.50–1.10)
Glucose, Bld: 102 mg/dL — ABNORMAL HIGH (ref 70–99)
Potassium: 4.1 mEq/L (ref 3.5–5.1)

## 2011-04-10 LAB — POCT PREGNANCY, URINE: Preg Test, Ur: NEGATIVE

## 2011-04-10 MED ORDER — NAPROXEN 500 MG PO TABS: 500.0000 mg | ORAL_TABLET | Freq: Two times a day (BID) | ORAL | Status: DC

## 2011-04-10 NOTE — ED Notes (Signed)
Chest pain started last week and increases with activity.  Pt states she has had chest pain before from bronchitis but this is not the same feeling.  Denies N/V/D.

## 2011-04-10 NOTE — ED Notes (Signed)
Pt has been having mid chest pain for one week.  This pain increased with palpation and movement (she feels a pulling pain).  No n/v with this.  PT has had some sob with this, this sob increases with exertion.

## 2011-04-10 NOTE — ED Provider Notes (Cosign Needed)
History     CSN: [id number]  Arrival date & time 04/10/11  1612   First MD Initiated Contact with Patient 04/10/11 1652      Chief Complaint  Patient presents with  . Chest Pain    (Consider location/radiation/quality/duration/timing/severity/associated sxs/prior treatment) Patient is a 26 y.o. female presenting with chest pain. The history is provided by the patient.  Chest Pain The chest pain began 1 - 2 weeks ago. Chest pain occurs constantly. The chest pain is worsening. The pain is associated with breathing, exertion and lifting. At its most intense, the pain is at 8/10. The pain is currently at 5/10. The severity of the pain is moderate. The quality of the pain is described as aching. The pain radiates to the epigastrium. Chest pain is worsened by exertion. Primary symptoms include shortness of breath. Pertinent negatives for primary symptoms include no fever, no syncope, no cough, no wheezing, no palpitations, no abdominal pain, no nausea, no vomiting and no dizziness.  Associated symptoms include lower extremity edema.  Pertinent negatives for associated symptoms include no diaphoresis, no near-syncope, no orthopnea and no weakness. She tried NSAIDs and antacids for the symptoms.   PT states pain is constant, substernal, worsened with movement, palpation, exertion. States took tylenol and pepcid with no relief. No history of the same. Denies nausea, vomiting, dizziness. No fever, chills, URI symptoms, cough. States shortness of breath "at times." No injury, recalls lifting heavy basket of wet laundry, otherwise no heavy lifting or unusual activity.  Past Medical History  Diagnosis Date  . Hypertension   . Urinary tract infection   . Abscess   . Obesity     Past Surgical History  Procedure Date  . Cesarean section   . Tonsillectomy     Family History  Problem Relation Age of Onset  . Hypertension Mother   . Diabetes Mother   . Diabetes Other   . Hypertension Other      History  Substance Use Topics  . Smoking status: Never Smoker   . Smokeless tobacco: Not on file  . Alcohol Use: No    OB History    Grav Para Term Preterm Abortions TAB SAB Ect Mult Living                  Review of Systems  Constitutional: Negative for fever, chills and diaphoresis.  HENT: Negative.   Eyes: Negative.   Respiratory: Positive for shortness of breath. Negative for cough and wheezing.   Cardiovascular: Positive for chest pain and leg swelling. Negative for palpitations, orthopnea, syncope and near-syncope.  Gastrointestinal: Negative for nausea, vomiting and abdominal pain.  Genitourinary: Negative.   Musculoskeletal: Negative.   Skin: Negative.   Neurological: Negative for dizziness, weakness and light-headedness.  Psychiatric/Behavioral: Negative.     Allergies  Review of patient's allergies indicates no known allergies.  Home Medications   Current Outpatient Rx  Name Route Sig Dispense Refill  . ASPIRIN 325 MG PO TABS Oral Take 325 mg by mouth daily.    . IBUPROFEN 200 MG PO TABS Oral Take 800 mg by mouth every 6 (six) hours as needed. For pain      BP 130/70  Pulse 79  Temp(Src) 97.7 F (36.5 C) (Oral)  Resp 16  SpO2 100%  LMP 02/25/2011  Physical Exam  Nursing note and vitals reviewed. Constitutional: She is oriented to person, place, and time. She appears well-developed and well-nourished. No distress.       Morbidly obese  Eyes: Conjunctivae are normal. Pupils are equal, round, and reactive to light.  Neck: Neck supple.  Cardiovascular: Normal rate, regular rhythm and normal heart sounds.   Pulmonary/Chest: Effort normal and breath sounds normal. No respiratory distress. She has no wheezes. She exhibits tenderness.       Chest tenderness to palpation over midsternum  Abdominal: Soft. Bowel sounds are normal. She exhibits no distension. There is no tenderness.       obese  Musculoskeletal: Normal range of motion.       Non pitting  bilateral LE edema  Neurological: She is alert and oriented to person, place, and time. No cranial nerve deficit. Coordination normal.  Skin: Skin is warm and dry. No rash noted.  Psychiatric: She has a normal mood and affect.    ED Course  Procedures (including critical care time) 5:42 PM Pt seen and examined by me. Pt with CP, other then being morbidly obese, she has no risk facturs for CAD or PE. Labs and CXR pending. Pain reproducible with palpation. ECG and VS normal.   Date: 04/10/2011  Rate: 85  Rhythm: normal sinus rhythm  QRS Axis: normal  Intervals: normal  ST/T Wave abnormalities: normal  Conduction Disutrbances:none  Narrative Interpretation:   Old EKG Reviewed: No significant changes noted    Results for orders placed during the hospital encounter of 04/10/11  D-DIMER, QUANTITATIVE      Component Value Range   D-Dimer, Quant 0.26  0.00 - 0.48 (ug/mL-FEU)  POCT I-STAT, CHEM 8      Component Value Range   Sodium 140  135 - 145 (mEq/L)   Potassium 4.1  3.5 - 5.1 (mEq/L)   Chloride 105  96 - 112 (mEq/L)   BUN 10  6 - 23 (mg/dL)   Creatinine, Ser 0.98  0.50 - 1.10 (mg/dL)   Glucose, Bld 119 (*) 70 - 99 (mg/dL)   Calcium, Ion 1.47  8.29 - 1.32 (mmol/L)   TCO2 25  0 - 100 (mmol/L)   Hemoglobin 12.9  12.0 - 15.0 (g/dL)   HCT 56.2  13.0 - 86.5 (%)  POCT I-STAT TROPONIN I      Component Value Range   Troponin i, poc 0.00  0.00 - 0.08 (ng/mL)   Comment 3           POCT PREGNANCY, URINE      Component Value Range   Preg Test, Ur NEGATIVE     Dg Chest 2 View  04/10/2011  *RADIOLOGY REPORT*  Clinical Data: Centralized chest pains.  Intermittent chest pain for 1 week.  CHEST - 2 VIEW  Comparison: Two-view chest 09/13/2006 at [facility].  Findings: Mild cardiomegaly is exaggerated by low lung volumes. There is no edema or effusion to suggest failure.  The visualized soft tissues and bony thorax are unremarkable.  IMPRESSION:  1.  Cardiomegaly without failure. 2.   No acute cardiopulmonary disease.  Original Report Authenticated By: Jamesetta Orleans. MATTERN, M.D.    Pt's troponin, d dimer, CXR, labs negative. ECG normal. Pt's pain reproducible with palpation. Suspect pleurisy vs costochondritis. Will start on anti inflammatories. Will d/c home. Educated on importance of diet and exercising.     No diagnosis found.    MDM         Lottie Mussel, PA 04/10/11 1906

## 2011-04-10 NOTE — ED Notes (Signed)
MD at bedside. 

## 2011-04-12 NOTE — ED Provider Notes (Signed)
History     CSN: [id number]  Arrival date & time 04/10/11  1612   First MD Initiated Contact with Patient 04/10/11 1652      Chief Complaint  Patient presents with  . Chest Pain    (Consider location/radiation/quality/duration/timing/severity/associated sxs/prior treatment) HPI  Past Medical History  Diagnosis Date  . Hypertension   . Urinary tract infection   . Abscess   . Obesity     Past Surgical History  Procedure Date  . Cesarean section   . Tonsillectomy     Family History  Problem Relation Age of Onset  . Hypertension Mother   . Diabetes Mother   . Diabetes Other   . Hypertension Other     History  Substance Use Topics  . Smoking status: Never Smoker   . Smokeless tobacco: Not on file  . Alcohol Use: No    OB History    Grav Para Term Preterm Abortions TAB SAB Ect Mult Living                  Review of Systems  Allergies  Review of patient's allergies indicates no known allergies.  Home Medications   Current Outpatient Rx  Name Route Sig Dispense Refill  . ASPIRIN 325 MG PO TABS Oral Take 325 mg by mouth daily.    . IBUPROFEN 200 MG PO TABS Oral Take 800 mg by mouth every 6 (six) hours as needed. For pain    . NAPROXEN 500 MG PO TABS Oral Take 1 tablet (500 mg total) by mouth 2 (two) times daily. 30 tablet 0    BP 123/68  Pulse 81  Temp(Src) 97.7 F (36.5 C) (Oral)  Resp 18  SpO2 97%  LMP 02/25/2011  Physical Exam  ED Course  Procedures (including critical care time)  Labs Reviewed  POCT I-STAT, CHEM 8 - Abnormal; Notable for the following:    Glucose, Bld 102 (*)    All other components within normal limits  D-DIMER, QUANTITATIVE  POCT I-STAT TROPONIN I  POCT PREGNANCY, URINE  LAB REPORT - SCANNED  I-STAT TROPONIN I  I-STAT, CHEM 8  POCT PREGNANCY, URINE   Dg Chest 2 View  04/10/2011  *RADIOLOGY REPORT*  Clinical Data: Centralized chest pains.  Intermittent chest pain for 1 week.  CHEST - 2 VIEW  Comparison: Two-view  chest 09/13/2006 at [facility].  Findings: Mild cardiomegaly is exaggerated by low lung volumes. There is no edema or effusion to suggest failure.  The visualized soft tissues and bony thorax are unremarkable.  IMPRESSION:  1.  Cardiomegaly without failure. 2.  No acute cardiopulmonary disease.  Original Report Authenticated By: Jamesetta Orleans. MATTERN, M.D.     1. Chest pain, non-cardiac     Medical screening examination/treatment/procedure(s) were performed by non-physician practitioner and as supervising physician I was immediately available for consultation/collaboration. Osvaldo Human, M.D.    Carleene Cooper III, MD 04/12/11 [phone]

## 2011-05-01 ENCOUNTER — Emergency Department (HOSPITAL_COMMUNITY)
Admission: EM | Admit: 2011-05-01 | Discharge: 2011-05-01 | Disposition: A | Discharge status: Home / Self Care | Payer: Self-pay | Attending: Emergency Medicine | Admitting: Emergency Medicine

## 2011-05-01 ENCOUNTER — Encounter (HOSPITAL_COMMUNITY): Payer: Self-pay | Admitting: *Deleted

## 2011-05-01 DIAGNOSIS — A499 Bacterial infection, unspecified: Secondary | ICD-10-CM | POA: Insufficient documentation

## 2011-05-01 DIAGNOSIS — R350 Frequency of micturition: Secondary | ICD-10-CM | POA: Insufficient documentation

## 2011-05-01 DIAGNOSIS — N760 Acute vaginitis: Secondary | ICD-10-CM | POA: Insufficient documentation

## 2011-05-01 DIAGNOSIS — M545 Low back pain, unspecified: Secondary | ICD-10-CM | POA: Insufficient documentation

## 2011-05-01 DIAGNOSIS — B9689 Other specified bacterial agents as the cause of diseases classified elsewhere: Secondary | ICD-10-CM | POA: Insufficient documentation

## 2011-05-01 DIAGNOSIS — R110 Nausea: Secondary | ICD-10-CM | POA: Insufficient documentation

## 2011-05-01 DIAGNOSIS — I10 Essential (primary) hypertension: Secondary | ICD-10-CM | POA: Insufficient documentation

## 2011-05-01 DIAGNOSIS — N72 Inflammatory disease of cervix uteri: Secondary | ICD-10-CM | POA: Insufficient documentation

## 2011-05-01 DIAGNOSIS — R10817 Generalized abdominal tenderness: Secondary | ICD-10-CM | POA: Insufficient documentation

## 2011-05-01 LAB — URINALYSIS, ROUTINE W REFLEX MICROSCOPIC
Bilirubin Urine: NEGATIVE
Glucose, UA: NEGATIVE mg/dL
Ketones, ur: NEGATIVE mg/dL
Leukocytes, UA: NEGATIVE
Nitrite: NEGATIVE
Protein, ur: NEGATIVE mg/dL
Specific Gravity, Urine: 1.016 (ref 1.005–1.030)
Urobilinogen, UA: 0.2 mg/dL (ref 0.0–1.0)
pH: 5.5 (ref 5.0–8.0)

## 2011-05-01 LAB — WET PREP, GENITAL: Trich, Wet Prep: NONE SEEN

## 2011-05-01 LAB — URINE MICROSCOPIC-ADD ON

## 2011-05-01 MED ORDER — METRONIDAZOLE 500 MG PO TABS: 500.0000 mg | ORAL_TABLET | Freq: Two times a day (BID) | ORAL | Status: AC

## 2011-05-01 MED ORDER — IBUPROFEN 800 MG PO TABS
800.0000 mg | ORAL_TABLET | Freq: Once | ORAL | Status: AC
  Administered 2011-05-01: 800 mg via ORAL
  Filled 2011-05-01: qty 1

## 2011-05-01 MED ORDER — CEFTRIAXONE SODIUM 250 MG IJ SOLR
250.0000 mg | Freq: Once | INTRAMUSCULAR | Status: AC
  Administered 2011-05-01: 250 mg via INTRAMUSCULAR
  Filled 2011-05-01: qty 250

## 2011-05-01 MED ORDER — LIDOCAINE HCL 2 % IJ SOLN
INTRAMUSCULAR | Status: AC
  Filled 2011-05-01: qty 1

## 2011-05-01 MED ORDER — IBUPROFEN 800 MG PO TABS: 800.0000 mg | ORAL_TABLET | Freq: Three times a day (TID) | ORAL | Status: AC | PRN

## 2011-05-01 MED ORDER — SULFAMETHOXAZOLE-TRIMETHOPRIM 800-160 MG PO TABS: 1.0000 | ORAL_TABLET | Freq: Two times a day (BID) | ORAL | Status: AC

## 2011-05-01 MED ORDER — AZITHROMYCIN 250 MG PO TABS
1000.0000 mg | ORAL_TABLET | Freq: Once | ORAL | Status: AC
  Administered 2011-05-01: 1000 mg via ORAL
  Filled 2011-05-01: qty 4

## 2011-05-01 NOTE — ED Notes (Signed)
Patient discharged ambulatory with instructions. Verbalized understanding of same.

## 2011-05-01 NOTE — ED Notes (Signed)
Patient back to stretcher 8. Medication given.

## 2011-05-01 NOTE — Discharge Instructions (Signed)
Please take the antibiotics as prescribed.  Use the resource list below to find a primary care provider for follow up.  You may return to the ER at any time for worsening condition or any new symptoms that concern you.   Cervicitis Cervicitis is a soreness and swelling (inflammation) of the cervix. Your cervix is located at the bottom of your uterus which opens up to the vagina.  CAUSES   Sexually transmitted infections (STIs).   Allergic reaction.   Medicines or birth control devices that are put in the vagina.   Injury to the cervix.   Bacterial infections.  SYMPTOMS  There may be no symptoms. If symptoms occur, they may include:  Grey, white, yellow, or bad smelling vaginal discharge.   Pain or itching of the area outside the vagina.   Painful sexual intercourse.   Lower abdominal or lower back pain, especially during intercourse.   Frequent urination.   Abnormal vaginal bleeding between periods, after sexual intercourse, or after menopause.   Pressure or a heavy feeling in the pelvis.  DIAGNOSIS  Diagnosis is made after a pelvic exam. Other tests may include:  Examination of any discharge under a microscope (wet prep).   A Pap test.  TREATMENT  Treatment will depend on the cause of cervicitis. If it is caused by an STI, both you and your partner will need to be treated. Antibiotic medicines will be given. HOME CARE INSTRUCTIONS   Do not have sexual intercourse until your caregiver says it is okay.   Do not have sexual intercourse until your partner has been treated if your cervicitis is caused by an STI.   Take your antibiotics as directed. Finish them even if you start to feel better.  SEEK IMMEDIATE MEDICAL CARE IF:   Your symptoms come back.   You have a fever.   You experience any problems that may be related to the medicine you are taking.  MAKE SURE YOU:   Understand these instructions.   Will watch your condition.   Will get help right away if  you are not doing well or get worse.  Document Released: 03/04/2005 Document Revised: 11/14/2010 Document Reviewed: 10/01/2010 [facility] Patient Information 2012 [place], [place].Bacterial Vaginosis Bacterial vaginosis (BV) is a vaginal infection where the normal balance of bacteria in the vagina is disrupted. The normal balance is then replaced by an overgrowth of certain bacteria. There are several different kinds of bacteria that can cause BV. BV is the most common vaginal infection in women of childbearing age. CAUSES   The cause of BV is not fully understood. BV develops when there is an increase or imbalance of harmful bacteria.   Some activities or behaviors can upset the normal balance of bacteria in the vagina and put women at increased risk including:   Having a new sex partner or multiple sex partners.   Douching.   Using an intrauterine device (IUD) for contraception.   It is not clear what role sexual activity plays in the development of BV. However, women that have never had sexual intercourse are rarely infected with BV.  Women do not get BV from toilet seats, bedding, swimming pools or from touching objects around them.  SYMPTOMS   Grey vaginal discharge.   A fish-like odor with discharge, especially after sexual intercourse.   Itching or burning of the vagina and vulva.   Burning or pain with urination.   Some women have no signs or symptoms at all.  DIAGNOSIS  Your  caregiver must examine the vagina for signs of BV. Your caregiver will perform lab tests and look at the sample of vaginal fluid through a microscope. They will look for bacteria and abnormal cells (clue cells), a pH test higher than 4.5, and a positive amine test all associated with BV.  RISKS AND COMPLICATIONS   Pelvic inflammatory disease (PID).   Infections following gynecology surgery.   Developing HIV.   Developing herpes virus.  TREATMENT  Sometimes BV will clear up without treatment.  However, all women with symptoms of BV should be treated to avoid complications, especially if gynecology surgery is planned. Female partners generally do not need to be treated. However, BV may spread between female sex partners so treatment is helpful in preventing a recurrence of BV.   BV may be treated with antibiotics. The antibiotics come in either pill or vaginal cream forms. Either can be used with nonpregnant or pregnant women, but the recommended dosages differ. These antibiotics are not harmful to the baby.   BV can recur after treatment. If this happens, a second round of antibiotics will often be prescribed.   Treatment is important for pregnant women. If not treated, BV can cause a premature delivery, especially for a pregnant woman who had a premature birth in the past. All pregnant women who have symptoms of BV should be checked and treated.   For chronic reoccurrence of BV, treatment with a type of prescribed gel vaginally twice a week is helpful.  HOME CARE INSTRUCTIONS   Finish all medication as directed by your caregiver.   Do not have sex until treatment is completed.   Tell your sexual partner that you have a vaginal infection. They should see their caregiver and be treated if they have problems, such as a mild rash or itching.   Practice safe sex. Use condoms. Only have 1 sex partner.  PREVENTION  Basic prevention steps can help reduce the risk of upsetting the natural balance of bacteria in the vagina and developing BV:  Do not have sexual intercourse (be abstinent).   Do not douche.   Use all of the medicine prescribed for treatment of BV, even if the signs and symptoms go away.   Tell your sex partner if you have BV. That way, they can be treated, if needed, to prevent reoccurrence.  SEEK MEDICAL CARE IF:   Your symptoms are not improving after 3 days of treatment.   You have increased discharge, pain, or fever.  MAKE SURE YOU:   Understand these  instructions.   Will watch your condition.   Will get help right away if you are not doing well or get worse.  FOR MORE INFORMATION  Division of STD Prevention (DSTDP), Centers for Disease Control and Prevention: [employer] American Social Health Association (ASHA): www.ashastd.org  Document Released: 03/04/2005 Document Revised: 11/14/2010 Document Reviewed: 08/25/2008 [facility] Patient Information 2012 [place], [place].Urinary Frequency The number of times a normal person urinates depends upon how much liquid they take in and how much liquid they are losing. If the temperature is hot and there is high humidity then the person will sweat more and usually breathe a little more frequently. These factors decrease the amount of frequency of urination that would be considered normal. The amount you drink is easily determined, but the amount of fluid lost is sometimes more difficult to calculate.  Fluid is lost in two ways:  Sensible fluid loss is usually measured by the amount of urine that you get rid  of. Losses of fluid can also occur with diarrhea.   Insensible fluid loss is more difficult to measure. It is caused by evaporation. Insensible loss of fluid occurs through breathing and sweating. It usually ranges from a little less than a quart to a little more than a quart of fluid a day.  In normal temperatures and activity levels the average person may urinate 4 to 7 times in a 24-hour period. Needing to urinate more often than that could indicate a problem. If one urinates 4 to 7 times in 24 hours and has large volumes each time, that could indicate a different problem from one who urinates 4 to 7 times a day and has small volumes. The time of urinating is also an important. Most urinating should be done during the waking hours. Getting up at night to urinate frequently can indicate some problems. CAUSES  The bladder is the organ in your lower abdomen that holds urine. Like a balloon, it swells  some as it fills up. Your nerves sense this and tell you it is time to head for the bathroom. There are a number of reasons that you might feel the need to urinate more often than usual. They include:  Urinary tract infection. This is usually associated with other signs such as burning when you urinate.   In men, problems with the prostate (a walnut-size gland that is located near the tube that carries urine out of your body). There are two reasons why the prostate can cause an increased frequency of urination:   An enlarged prostate that does not let the bladder empty well. If the bladder only half empties when you urinate then it only has half the capacity to fill before you have to urinate again.   The nerves in the bladder become more hypersensitive with an increased size of the prostate even if the bladder empties completely.   Pregnancy.   Obesity. Excess weight is more likely to cause a problem for women more than for men.   Bladder stones or other bladder problems.   Caffeine.   Alcohol.   Medications. For example, drugs that help the body get rid of extra fluid (diuretics) increase urine production. Some other medicines must be taken with lots of fluids.   Muscle or nerve weakness. This might be the result of a spinal cord injury, a stroke, multiple sclerosis or Parkinson's disease.   Long-standing diabetes can decrease the sensation of the bladder. This loss of sensation makes it harder to sense the bladder needs to be emptied. Over a period of years the bladder is stretched out by constant overfilling. This weakens the bladder muscles so that the bladder does not empty well and has less capacity to fill with new urine.   Interstitial cystitis (also called painful bladder syndrome). This condition develops because the tissues that line the insider of the bladder are inflamed (inflammation is the body's way of reacting to injury or infection). It causes pain and frequent urination.  It occurs in women more often than in men.  DIAGNOSIS   To decide what might be causing your urinary frequency, your healthcare provider will probably:   Ask about symptoms you have noticed.   Ask about your overall health. This will include questions about any medications you are taking.   Do a physical examination.   Order some tests. These might include:   A blood test to check for diabetes or other health issues that could be contributing to the problem.  Urine testing. This could measure the flow of urine and the pressure on the bladder.   A test of your neurological system (the brain, spinal cord and nerves). This is the system that senses the need to urinate.   A bladder test to check whether it is emptying completely when you urinate.   Cytoscopy. This test uses a thin tube with a tiny camera on it. It offers a look inside your urethra and bladder to see if there are problems.   Imaging tests. You might be given a contrast dye and then asked to urinate. X-rays are taken to see how your bladder is working.  TREATMENT  It is important for you to be evaluated to determine if the amount or frequency that you have is unusual or abnormal. If it is found to be abnormal the cause should be determined and this can usually be found out easily. Depending upon the cause treatment could include medication, stimulation of the nerves, or surgery. There are not too many things that you can do as an individual to change your urinary frequency. It is important that you balance the amount of fluid intake needed to compensate for your activity and the temperature. Medical problems will be diagnosed and taken care of by your physician. There is no particular bladder training such as Kegel's exercises that you can do to help urinary frequency. This is an exercise this is usually done for people who have leaking of urine when they laugh cough or sneeze. HOME CARE INSTRUCTIONS   Take any medications  your healthcare provider prescribed or suggested. Follow the directions carefully.   Practice any lifestyle changes that are recommended. These might include:   Drinking less fluid or drinking at different times of the day. If you need to urinate often during the night, for example, you may need to stop drinking fluids early in the evening.   Cutting down on caffeine or alcohol. They both can make you need to urinate more often than normal. Caffeine is found in coffee, tea and sodas.   Losing weight, if that is recommended.   Keep a journal or a log. You might be asked to record how much you drink and when and when you feel the need to urinate. This will also help evaluate how well the treatment provided by your physician is working.  SEEK MEDICAL CARE IF:   Your need to urinate often gets worse.   You feel increased pain or irritation when you urinate.   You notice blood in your urine.   You have questions about any medications that your healthcare provider recommended.   You notice blood, pus or swelling at the site of any test or treatment procedure.   You develop a fever of more than 100.5 F (38.1 C).  SEEK IMMEDIATE MEDICAL CARE IF:  You develop a fever of more than 102.0 F (38.9 C). Document Released: 12/29/2008 Document Revised: 11/14/2010 Document Reviewed: 12/29/2008 [facility] Patient Information 2012 [place], [place].  RESOURCE GUIDE  Dental Problems  Patients with Medicaid: [facility] [phone] [address]                                           [phone] [address] Phone:  [phone]  Phone:  [phone]  If unable to pay or uninsured, contact:  [facility] or [facility]. to become qualified for the adult dental clinic.  Chronic Pain Problems Contact [facility]  [phone] Patients need to be referred by their primary care  doctor.  Insufficient Money for Medicine Contact [employer]:  call "211" or [facility] [phone] 003-6804.  No Primary Care Doctor Call [facility]  [phone] Other agencies that provide inexpensive medical care    [facility]  [phone]    [facility]  [phone]    [facility]  [phone]    [facility]  [phone]    [facility]  7152727912    [facility]  [phone]  Psychological Services [facility]  [phone] [facility] Services  [phone] [facility]   [phone] (emergency services 484-144-2702)  Substance Abuse Resources [facility]  317-357-6144 [facility] [phone] [facility] [phone] Floydene Flock 8052571274 Residential & Outpatient Substance Abuse Program  919-074-6694  Abuse/Neglect [facility] Child Abuse Hotline (816) 354-9308 [facility] Child Abuse Hotline 414-872-7360 (After Hours)  Emergency Shelter [facility] [phone]) 628-6438  M[facility] [place] [facility] [phone] [phone] [facility] 903-475-8138  MRSA Hotline #:   7036372790    [facility] Resources  [facility] [place]     [employer]                          [facility] Dept. [address] [place]                       [address]      [address]  Blondell Reveal Phone:  [phone]                                   Phone:  [phone]                 Phone:  [phone]  [facility] Health Phone:  [phone]  [facility] Child Abuse Hotline 519-753-5443 [phone] (After Hours)

## 2011-05-01 NOTE — ED Provider Notes (Signed)
History     CSN: [id number]  Arrival date & time 05/01/11  0846   First MD Initiated Contact with Patient 05/01/11 [phone]      Chief Complaint  Patient presents with  . Abdominal Pain  . Blister  . Back Pain    (Consider location/radiation/quality/duration/timing/severity/associated sxs/prior treatment) HPI Comments: Patient reports diffuse abdominal pain with urinary frequency and malodorous urine for one week.  The pain is described as constant cramping pain with occasional sharp pain. States this has gotten much worse over the past 2-3 days and she has had associated nausea.  She is also having low back pain.  Denies fever or chills, vomiting.  Denies change in bowel habits.  Denies abnormal vaginal discharge or bleeding.  Last menstrual period was the middle of January.  States this feels exactly like her previous urinary tract infection with the exception of this time having nausea.  Patient is a 26 y.o. female presenting with abdominal pain and back pain. The history is provided by the patient.  Abdominal Pain The primary symptoms of the illness include abdominal pain.  Additional symptoms associated with the illness include back pain.  Back Pain  Associated symptoms include abdominal pain.    Past Medical History  Diagnosis Date  . Hypertension   . Urinary tract infection   . Abscess   . Obesity     Past Surgical History  Procedure Date  . Cesarean section   . Tonsillectomy     Family History  Problem Relation Age of Onset  . Hypertension Mother   . Diabetes Mother   . Diabetes Other   . Hypertension Other     History  Substance Use Topics  . Smoking status: Never Smoker   . Smokeless tobacco: Not on file  . Alcohol Use: No    OB History    Grav Para Term Preterm Abortions TAB SAB Ect Mult Living                  Review of Systems  Gastrointestinal: Positive for abdominal pain.  Musculoskeletal: Positive for back pain.  All other systems reviewed  and are negative.    Allergies  Review of patient's allergies indicates no known allergies.  Home Medications   Current Outpatient Rx  Name Route Sig Dispense Refill  . IBUPROFEN 200 MG PO TABS Oral Take 800 mg by mouth every 6 (six) hours as needed. For pain    . OVER THE COUNTER MEDICATION Oral Take 2 capsules by mouth 3 (three) times daily. Azo.      BP 145/100  Pulse 115  Temp(Src) 97.5 F (36.4 C) (Oral)  Resp 20  SpO2 94%  Physical Exam  Nursing note and vitals reviewed. Constitutional: She is oriented to person, place, and time. She appears well-developed and well-nourished.  HENT:  Head: Normocephalic and atraumatic.  Neck: Neck supple.  Cardiovascular: Normal rate, regular rhythm and normal heart sounds.   Pulmonary/Chest: Breath sounds normal. No respiratory distress. She has no wheezes. She has no rales. She exhibits no tenderness.  Abdominal: Soft. Bowel sounds are normal. She exhibits no distension. There is generalized tenderness. There is no rebound, no guarding and no CVA tenderness.       Morbidly obese  Genitourinary: There is tenderness around the vagina. Vaginal discharge found.       White discharge within vagina.  Bimanual exam extremely limited due to patient body habitus.  Entire vagina and cervix tender, without exquisite cervical motion tenderness.  Neurological: She is alert and oriented to person, place, and time.    ED Course  Procedures (including critical care time)  Labs Reviewed  URINALYSIS, ROUTINE W REFLEX MICROSCOPIC - Abnormal; Notable for the following:    APPearance CLOUDY (*)    Hgb urine dipstick TRACE (*)    All other components within normal limits  URINE MICROSCOPIC-ADD ON - Abnormal; Notable for the following:    Squamous Epithelial / LPF MANY (*)    Bacteria, UA FEW (*)    All other components within normal limits  WET PREP, GENITAL - Abnormal; Notable for the following:    Clue Cells Wet Prep HPF POC FEW (*)    WBC,  Wet Prep HPF POC FEW (*)    All other components within normal limits  POCT PREGNANCY, URINE  GC/CHLAMYDIA PROBE AMP, GENITAL   No results found.   1. Urinary frequency   2. Bacterial vaginosis   3. Cervicitis       MDM  Nontoxic afebrile morbidly obese patient with complaints of abnormal malodorous urine and urinary frequency, diffuse abdominal pain, and nausea.  Abdominal exam is nonfocal.  The urinalysis does not show a clear infection-however, as patient is having symptoms.  Will treat with three days of Bactrim.  Patient also has infected hair follicle in right thigh - this will also be helpful in treatment of this.  Patient also advised to do warm moist compresses.  Patient's pelvic exam is significant for tenderness throughout exam - patient treated for cervicitis and BV.  Discussed all results and treatment plan with patient.  Patient verbalizes understanding and agrees with plan.       Dillard Cannon [place], [place] 05/01/11 [phone]

## 2011-05-01 NOTE — ED Notes (Signed)
Pt. Reports that she has had abdominal pain for one week that has gotten worse over the past 3 days.  Pt. reports that she may possibly have an abscess to the lower thigh.  Pt. Also reports nausea but no vomiting and diarrhea.

## 2011-05-01 NOTE — ED Provider Notes (Signed)
Medical screening examination/treatment/procedure(s) were performed by non-physician practitioner and as supervising physician I was immediately available for consultation/collaboration.   Andreyah Natividad, MD 05/01/11 1607 

## 2011-05-02 LAB — GC/CHLAMYDIA PROBE AMP, GENITAL
Chlamydia, DNA Probe: NEGATIVE
GC Probe Amp, Genital: NEGATIVE

## 2011-06-16 ENCOUNTER — Emergency Department (HOSPITAL_COMMUNITY)
Admission: EM | Admit: 2011-06-16 | Discharge: 2011-06-16 | Disposition: A | Discharge status: Home / Self Care | Payer: Self-pay | Attending: Emergency Medicine | Admitting: Emergency Medicine

## 2011-06-16 ENCOUNTER — Encounter (HOSPITAL_COMMUNITY): Payer: Self-pay

## 2011-06-16 DIAGNOSIS — R110 Nausea: Secondary | ICD-10-CM | POA: Insufficient documentation

## 2011-06-16 DIAGNOSIS — R1013 Epigastric pain: Secondary | ICD-10-CM | POA: Insufficient documentation

## 2011-06-16 LAB — DIFFERENTIAL
Eosinophils Relative: 3 % (ref 0–5)
Lymphocytes Relative: 35 % (ref 12–46)
Lymphs Abs: 2.9 10*3/uL (ref 0.7–4.0)
Monocytes Absolute: 0.4 10*3/uL (ref 0.1–1.0)
Monocytes Relative: 5 % (ref 3–12)

## 2011-06-16 LAB — COMPREHENSIVE METABOLIC PANEL
ALT: 28 U/L (ref 0–35)
BUN: 11 mg/dL (ref 6–23)
CO2: 28 mEq/L (ref 19–32)
Calcium: 9.1 mg/dL (ref 8.4–10.5)
Creatinine, Ser: 0.52 mg/dL (ref 0.50–1.10)
GFR calc Af Amer: >90 mL/min (ref >90)
GFR calc non Af Amer: >90 mL/min (ref >90)
Glucose, Bld: 204 mg/dL — ABNORMAL HIGH (ref 70–99)

## 2011-06-16 LAB — URINALYSIS, ROUTINE W REFLEX MICROSCOPIC
Bilirubin Urine: NEGATIVE
Glucose, UA: NEGATIVE mg/dL
Hgb urine dipstick: NEGATIVE
Specific Gravity, Urine: 1.034 — ABNORMAL HIGH (ref 1.005–1.030)

## 2011-06-16 LAB — CBC
HCT: 33.8 % — ABNORMAL LOW (ref 36.0–46.0)
MCV: 88.3 fL (ref 78.0–100.0)
RBC: 3.83 MIL/uL — ABNORMAL LOW (ref 3.87–5.11)
WBC: 8.1 10*3/uL (ref 4.0–10.5)

## 2011-06-16 LAB — URINE MICROSCOPIC-ADD ON

## 2011-06-16 LAB — POCT PREGNANCY, URINE: Preg Test, Ur: NEGATIVE

## 2011-06-16 MED ORDER — HYDROCODONE-ACETAMINOPHEN 5-325 MG PO TABS
1.0000 | ORAL_TABLET | Freq: Once | ORAL | Status: AC
  Administered 2011-06-16: 1 via ORAL
  Filled 2011-06-16: qty 1

## 2011-06-16 MED ORDER — HYDROCODONE-ACETAMINOPHEN 5-325 MG PO TABS: 1.0000 | ORAL_TABLET | ORAL | Status: AC | PRN

## 2011-06-16 NOTE — ED Notes (Addendum)
Pt c/o generalized abd pain x1 wk, nauseous x3 days, pt denies vomiting or diarrhea, pt reports she is able to keep food down but states "I just really don't feel like it."

## 2011-06-16 NOTE — Discharge Instructions (Signed)

## 2011-06-16 NOTE — ED Provider Notes (Signed)
Medical screening examination/treatment/procedure(s) were performed by non-physician practitioner and as supervising physician I was immediately available for consultation/collaboration.  Cheri Guppy, MD 06/16/11 [phone]

## 2011-06-16 NOTE — ED Notes (Signed)
Sharp pains in epigastric region. Taking flagyl for UTI. Nausea. Duration - since last Friday - took pepto without relief.

## 2011-06-16 NOTE — ED Notes (Signed)
I gave the patients son a coloring book and a pack of crayons.

## 2011-06-16 NOTE — ED Provider Notes (Signed)
History     CSN: [id number]  Arrival date & time 06/16/11  1425   First MD Initiated Contact with Patient 06/16/11 1631      Chief Complaint  Patient presents with  . Abdominal Pain    (Consider location/radiation/quality/duration/timing/severity/associated sxs/prior treatment) HPI Comments: Patient here with sharp epigastric pain after taking flagyl for UTI - states nausea but no vomiting - she is concerned that she may be pregnant - denies fever, chills, dysuria, hematuria, vaginal discharge or bleeding.  She has been trying to take pepto bismal without relief of symptoms - denies any alcohol use.  Patient is a 26 y.o. female presenting with abdominal pain. The history is provided by the patient. No language interpreter was used.  Abdominal Pain The primary symptoms of the illness include abdominal pain and nausea. The primary symptoms of the illness do not include fever, fatigue, shortness of breath, vomiting, diarrhea, hematemesis, hematochezia, dysuria, vaginal discharge or vaginal bleeding. The current episode started more than 2 days ago. The onset of the illness was gradual. The problem has not changed since onset. The illness is associated with recent antibiotic use. The patient states that she believes she is currently not pregnant (possible). The patient has not had a change in bowel habit. Symptoms associated with the illness do not include chills, anorexia, diaphoresis, heartburn, constipation, urgency, hematuria, frequency or back pain.    Past Medical History  Diagnosis Date  . Hypertension   . Urinary tract infection   . Abscess   . Obesity     Past Surgical History  Procedure Date  . Cesarean section   . Tonsillectomy     Family History  Problem Relation Age of Onset  . Hypertension Mother   . Diabetes Mother   . Diabetes Other   . Hypertension Other     History  Substance Use Topics  . Smoking status: Never Smoker   . Smokeless tobacco: Not on file    . Alcohol Use: No    OB History    Grav Para Term Preterm Abortions TAB SAB Ect Mult Living                  Review of Systems  Constitutional: Negative for fever, chills, diaphoresis and fatigue.  Respiratory: Negative for shortness of breath.   Gastrointestinal: Positive for nausea and abdominal pain. Negative for heartburn, vomiting, diarrhea, constipation, hematochezia, anorexia and hematemesis.  Genitourinary: Negative for dysuria, urgency, frequency, hematuria, vaginal bleeding and vaginal discharge.  Musculoskeletal: Negative for back pain.  All other systems reviewed and are negative.    Allergies  Review of patient's allergies indicates no known allergies.  Home Medications   Current Outpatient Rx  Name Route Sig Dispense Refill  . BISMUTH SUBSALICYLATE 262 MG PO CHEW Oral Chew 524 mg by mouth as needed. For nausea    . IBUPROFEN 200 MG PO TABS Oral Take 800 mg by mouth every 6 (six) hours as needed. For pain    . METRONIDAZOLE 500 MG PO TABS Oral Take 500 mg by mouth 2 (two) times daily. 7 day course filled 3/27    . OVER THE COUNTER MEDICATION Oral Take 1 tablet by mouth at bedtime. Sleeping pill family dollar brand      BP 151/91  Pulse 102  Temp(Src) 98.3 F (36.8 C) (Oral)  Resp 18  SpO2 97%  LMP 04/19/2011  Physical Exam  Nursing note and vitals reviewed. Constitutional: She is oriented to person, place, and time. She appears  well-developed and well-nourished. No distress.       Morbidly obese  HENT:  Head: Normocephalic and atraumatic.  Right Ear: External ear normal.  Left Ear: External ear normal.  Nose: Nose normal.  Mouth/Throat: Oropharynx is clear and moist. No oropharyngeal exudate.  Eyes: Conjunctivae are normal. Pupils are equal, round, and reactive to light. No scleral icterus.  Neck: Normal range of motion. Neck supple.  Cardiovascular: Normal rate, regular rhythm and normal heart sounds.  Exam reveals no gallop and no friction rub.    No murmur heard. Pulmonary/Chest: Effort normal and breath sounds normal. No respiratory distress. She has no wheezes. She has no rales. She exhibits no tenderness.  Abdominal: Soft. Bowel sounds are normal. She exhibits no distension and no mass. There is tenderness. There is no rebound and no guarding.    Musculoskeletal: Normal range of motion. She exhibits no edema and no tenderness.  Lymphadenopathy:    She has no cervical adenopathy.  Neurological: She is alert and oriented to person, place, and time. No cranial nerve deficit.  Skin: Skin is warm and dry. No rash noted. No erythema. No pallor.  Psychiatric: She has a normal mood and affect. Her behavior is normal. Judgment and thought content normal.    ED Course  Procedures (including critical care time)  Labs Reviewed  URINALYSIS, ROUTINE W REFLEX MICROSCOPIC - Abnormal; Notable for the following:    APPearance CLOUDY (*)    Specific Gravity, Urine 1.034 (*)    Leukocytes, UA TRACE (*)    All other components within normal limits  CBC - Abnormal; Notable for the following:    RBC 3.83 (*)    Hemoglobin 10.8 (*)    HCT 33.8 (*)    All other components within normal limits  COMPREHENSIVE METABOLIC PANEL - Abnormal; Notable for the following:    Glucose, Bld 204 (*)    Albumin 3.4 (*)    Total Bilirubin 0.1 (*)    All other components within normal limits  URINE MICROSCOPIC-ADD ON - Abnormal; Notable for the following:    Squamous Epithelial / LPF MANY (*)    All other components within normal limits  DIFFERENTIAL  LIPASE, BLOOD  POCT PREGNANCY, URINE   No results found. Results for orders placed during the hospital encounter of 06/16/11  URINALYSIS, ROUTINE W REFLEX MICROSCOPIC      Component Value Range   Color, Urine YELLOW  YELLOW    APPearance CLOUDY (*) CLEAR    Specific Gravity, Urine 1.034 (*) 1.005 - 1.030    pH 6.0  5.0 - 8.0    Glucose, UA NEGATIVE  NEGATIVE (mg/dL)   Hgb urine dipstick NEGATIVE   NEGATIVE    Bilirubin Urine NEGATIVE  NEGATIVE    Ketones, ur NEGATIVE  NEGATIVE (mg/dL)   Protein, ur NEGATIVE  NEGATIVE (mg/dL)   Urobilinogen, UA 1.0  0.0 - 1.0 (mg/dL)   Nitrite NEGATIVE  NEGATIVE    Leukocytes, UA TRACE (*) NEGATIVE   CBC      Component Value Range   WBC 8.1  4.0 - 10.5 (K/uL)   RBC 3.83 (*) 3.87 - 5.11 (MIL/uL)   Hemoglobin 10.8 (*) 12.0 - 15.0 (g/dL)   HCT 45.4 (*) 09.8 - 46.0 (%)   MCV 88.3  78.0 - 100.0 (fL)   MCH 28.2  26.0 - 34.0 (pg)   MCHC 32.0  30.0 - 36.0 (g/dL)   RDW 11.9  14.7 - 82.9 (%)   Platelets 342  150 -  400 (K/uL)  DIFFERENTIAL      Component Value Range   Neutrophils Relative 57  43 - 77 (%)   Neutro Abs 4.6  1.7 - 7.7 (K/uL)   Lymphocytes Relative 35  12 - 46 (%)   Lymphs Abs 2.9  0.7 - 4.0 (K/uL)   Monocytes Relative 5  3 - 12 (%)   Monocytes Absolute 0.4  0.1 - 1.0 (K/uL)   Eosinophils Relative 3  0 - 5 (%)   Eosinophils Absolute 0.2  0.0 - 0.7 (K/uL)   Basophils Relative 0  0 - 1 (%)   Basophils Absolute 0.0  0.0 - 0.1 (K/uL)  COMPREHENSIVE METABOLIC PANEL      Component Value Range   Sodium 137  135 - 145 (mEq/L)   Potassium 3.9  3.5 - 5.1 (mEq/L)   Chloride 101  96 - 112 (mEq/L)   CO2 28  19 - 32 (mEq/L)   Glucose, Bld 204 (*) 70 - 99 (mg/dL)   BUN 11  6 - 23 (mg/dL)   Creatinine, Ser 1.61  0.50 - 1.10 (mg/dL)   Calcium 9.1  8.4 - 09.6 (mg/dL)   Total Protein 6.7  6.0 - 8.3 (g/dL)   Albumin 3.4 (*) 3.5 - 5.2 (g/dL)   AST 20  0 - 37 (U/L)   ALT 28  0 - 35 (U/L)   Alkaline Phosphatase 62  39 - 117 (U/L)   Total Bilirubin 0.1 (*) 0.3 - 1.2 (mg/dL)   GFR calc non Af Amer >90  >90 (mL/min)   GFR calc Af Amer >90  >90 (mL/min)  LIPASE, BLOOD      Component Value Range   Lipase 21  11 - 59 (U/L)  POCT PREGNANCY, URINE      Component Value Range   Preg Test, Ur NEGATIVE  NEGATIVE   URINE MICROSCOPIC-ADD ON      Component Value Range   Squamous Epithelial / LPF MANY (*) RARE    WBC, UA 0-2  <3 (WBC/hpf)   Bacteria, UA  RARE  RARE    Urine-Other MUCOUS PRESENT     No results found.  '  No diagnosis found.    MDM  Patient in no acute distress, immediately after seeing me she is requesting food to eat, morbidly obese female with soft abdomen without significant lab findings, I do not feel imaging is necessary at this time, patient able to tolerate po food and fluids.  Will discharge home with rx for pain and nausea.  Believe this to be related to the flagyl        Scarlette Calico C. [place], [place] 06/16/11 [phone]

## 2011-06-16 NOTE — ED Notes (Signed)
Pt given po challenge of crackers and a cup of ice water to drink per PA

## 2011-06-16 NOTE — ED Notes (Signed)
Missed menstural cycle last month.

## 2011-06-16 NOTE — ED Notes (Signed)
Pt reports her [facility] was 04/19/11 and she could possibly be pregnant

## 2011-06-16 NOTE — ED Notes (Signed)
I gave the patient a warm blanket. 

## 2011-06-29 ENCOUNTER — Emergency Department (HOSPITAL_COMMUNITY)
Admission: EM | Admit: 2011-06-29 | Discharge: 2011-06-29 | Disposition: A | Discharge status: Home / Self Care | Payer: Self-pay | Attending: Emergency Medicine | Admitting: Emergency Medicine

## 2011-06-29 ENCOUNTER — Encounter (HOSPITAL_COMMUNITY): Payer: Self-pay | Admitting: Family Medicine

## 2011-06-29 DIAGNOSIS — I10 Essential (primary) hypertension: Secondary | ICD-10-CM | POA: Insufficient documentation

## 2011-06-29 DIAGNOSIS — K029 Dental caries, unspecified: Secondary | ICD-10-CM | POA: Insufficient documentation

## 2011-06-29 DIAGNOSIS — E669 Obesity, unspecified: Secondary | ICD-10-CM | POA: Insufficient documentation

## 2011-06-29 DIAGNOSIS — K0889 Other specified disorders of teeth and supporting structures: Secondary | ICD-10-CM

## 2011-06-29 MED ORDER — PENICILLIN V POTASSIUM 500 MG PO TABS: 500.0000 mg | ORAL_TABLET | Freq: Four times a day (QID) | ORAL | Status: AC

## 2011-06-29 MED ORDER — OXYCODONE-ACETAMINOPHEN 5-325 MG PO TABS
1.0000 | ORAL_TABLET | Freq: Once | ORAL | Status: AC
  Administered 2011-06-29: 1 via ORAL
  Filled 2011-06-29: qty 1

## 2011-06-29 MED ORDER — OXYCODONE-ACETAMINOPHEN 5-325 MG PO TABS: 1.0000 | ORAL_TABLET | Freq: Four times a day (QID) | ORAL | Status: AC | PRN

## 2011-06-29 NOTE — ED Provider Notes (Signed)
Medical screening examination/treatment/procedure(s) were performed by non-physician practitioner and as supervising physician I was immediately available for consultation/collaboration.  Kelten Enochs R. Chastin Garlitz, MD 06/29/11 1949 

## 2011-06-29 NOTE — Discharge Instructions (Signed)
Dental Pain A tooth ache may be caused by cavities (tooth decay). Cavities expose the nerve of the tooth to air and hot or cold temperatures. It may come from an infection or abscess (also called a boil or furuncle) around your tooth. It is also often caused by dental caries (tooth decay). This causes the pain you are having. DIAGNOSIS  Your caregiver can diagnose this problem by exam. TREATMENT   If caused by an infection, it may be treated with medications which kill germs (antibiotics) and pain medications as prescribed by your caregiver. Take medications as directed.   Only take over-the-counter or prescription medicines for pain, discomfort, or fever as directed by your caregiver.   Whether the tooth ache today is caused by infection or dental disease, you should see your dentist as soon as possible for further care.  SEEK MEDICAL CARE IF: The exam and treatment you received today has been provided on an emergency basis only. This is not a substitute for complete medical or dental care. If your problem worsens or new problems (symptoms) appear, and you are unable to meet with your dentist, call or return to this location. SEEK IMMEDIATE MEDICAL CARE IF:   You have a fever.   You develop redness and swelling of your face, jaw, or neck.   You are unable to open your mouth.   You have severe pain uncontrolled by pain medicine.  MAKE SURE YOU:   Understand these instructions.   Will watch your condition.   Will get help right away if you are not doing well or get worse.  Document Released: 03/04/2005 Document Revised: 02/21/2011 Document Reviewed: 10/21/2007 ExitCare Patient Information 2012 [employer].  RESOURCE GUIDE  Dental Problems  Patients with Medicaid: [facility]                     [facility] [address]                                           [address] Phone:  [phone]                                                   Phone:  [phone]  If unable to pay or uninsured, contact:  [facility] or [facility] to become qualified for the adult dental clinic.  Chronic Pain Problems Contact [facility]  [phone] Patients need to be referred by their primary care doctor.  Insufficient Money for Medicine Contact [employer]:  call "211" or [facility] 271-5999.  No Primary Care Doctor Call [facility]  832-8000 Other agencies that provide inexpensive medical care    [facility]  [phone]    [facility]  [phone]    [facility]  [phone]    [facility] Clinic  [phone]    [facility]  [phone]    [facility]  [phone]  Psychological Services [facility]  [phone] [facility]  [phone] [facility]   800 853-5163 (emergency services 641-4993)  Substance Abuse Resources [facility]  336-882-2125 [facility] 336-784-9470 [facility] 336-285-9073 [facility]   336-845-3988 Residential & Outpatient Substance Abuse Program  800-659-3381  Abuse/Neglect [place] Child Abuse Hotline (336) 641-3795 [place] Child Abuse Hotline 800-378-5315 (After Hours)  Emergency Shelter [facility] (336) 271-5985  [facility] (336) 275-9566 [facility] (704) 372-4663  MRSA Hotline #:   832-7006    [place] [facility]  [facility] [place]     [employer]                          [facility] [address] [place]                       [address]      [address]  [place]                                                [place]                            [place] Phone:  [phone]                                   Phone:  [phone]                 Phone:  [phone]  [facility] Phone:   [phone]  [place] Child Abuse Hotline (336) 342-1394 (336) 342-3537 (After Hours)   

## 2011-06-29 NOTE — ED Notes (Signed)
Per pt tooth pain and mouth swelling x 3 days

## 2011-06-29 NOTE — ED Provider Notes (Signed)
History     CSN: [id number]  Arrival date & time 06/29/11  1506   First MD Initiated Contact with Patient 06/29/11 1629      Chief Complaint  Patient presents with  . Dental Pain    (Consider location/radiation/quality/duration/timing/severity/associated sxs/prior treatment) HPI  Patient presents to the emergency department with a dental complaint. Symptoms began 3 days ago. The patient has tried to alleviate pain with ibuprofen.  Pain rated at a 10/10, characterized as throbbing in nature and located right upper molar. Patient denies fever, night sweats, chills, difficulty swallowing or opening mouth, SOB, nuchal rigidity or decreased ROM of neck.  Patient does not have a dentist and requests a resource guide at discharge.   Past Medical History  Diagnosis Date  . Hypertension   . Urinary tract infection   . Abscess   . Obesity     Past Surgical History  Procedure Date  . Cesarean section   . Tonsillectomy     Family History  Problem Relation Age of Onset  . Hypertension Mother   . Diabetes Mother   . Diabetes Other   . Hypertension Other     History  Substance Use Topics  . Smoking status: Never Smoker   . Smokeless tobacco: Not on file  . Alcohol Use: No    OB History    Grav Para Term Preterm Abortions TAB SAB Ect Mult Living                  Review of Systems  All other systems reviewed and are negative.    Allergies  Review of patient's allergies indicates no known allergies.  Home Medications   Current Outpatient Rx  Name Route Sig Dispense Refill  . IBUPROFEN 200 MG PO TABS Oral Take 800 mg by mouth every 8 (eight) hours as needed. For pain    . OVER THE COUNTER MEDICATION Oral Take 1 tablet by mouth 3 times/day as needed-between meals & bedtime. Sleeping pill family dollar brand    . OXYCODONE-ACETAMINOPHEN 5-325 MG PO TABS Oral Take 1 tablet by mouth every 6 (six) hours as needed for pain. 15 tablet 0  . PENICILLIN V POTASSIUM 500 MG PO  TABS Oral Take 1 tablet (500 mg total) by mouth 4 (four) times daily. 40 tablet 0    BP 158/96  Pulse 112  Temp(Src) 97.8 F (36.6 C) (Oral)  Resp 20  SpO2 99%  LMP 06/29/2011  Physical Exam  Nursing note and vitals reviewed. Constitutional: She appears well-developed and well-nourished. No distress.  HENT:  Head: Normocephalic and atraumatic.  Mouth/Throat: Uvula is midline, oropharynx is clear and moist and mucous membranes are normal. Normal dentition. Dental caries (Pts tooth shows no obvious abscess but moderate to severe tenderness to palpation of marked tooth) present. No uvula swelling.    Eyes: Pupils are equal, round, and reactive to light.  Neck: Trachea normal, normal range of motion and full passive range of motion without pain. Neck supple.  Cardiovascular: Normal rate, regular rhythm, normal heart sounds and normal pulses.   Pulmonary/Chest: Effort normal and breath sounds normal. No respiratory distress. Chest wall is not dull to percussion. She exhibits no tenderness, no crepitus, no edema, no deformity and no retraction.  Abdominal: Soft. Normal appearance.  Musculoskeletal: Normal range of motion.  Neurological: She is alert. She has normal strength.  Skin: Skin is warm, dry and intact. She is not diaphoretic.  Psychiatric: She has a normal mood and affect. Her speech  is normal. Cognition and memory are normal.    ED Course  Procedures (including critical care time)  Labs Reviewed - No data to display No results found.   1. Pain, dental       MDM  Pt given Rx for Percocets 5-325 (10 tabs) and Penicillin. Patient informed that they need to find a dentist and have the tooth pulled or the symptoms may be reoccurring. A Resource guide has been given with dental providers. Patient has been given return to ED precautions.         Dorthula Matas, PA 06/29/11 1715

## 2011-06-29 NOTE — ED Notes (Signed)
Discharge instructions reviewed; verbalizes understanding.  No questions asked; no further c/o's voiced.  Pt ambulatory to lobby.  NAD noted. 

## 2011-08-10 ENCOUNTER — Encounter (HOSPITAL_COMMUNITY): Payer: Self-pay | Admitting: Adult Health

## 2011-08-10 ENCOUNTER — Emergency Department (HOSPITAL_COMMUNITY)
Admission: EM | Admit: 2011-08-10 | Discharge: 2011-08-10 | Disposition: A | Discharge status: Home / Self Care | Payer: Self-pay | Attending: Emergency Medicine | Admitting: Emergency Medicine

## 2011-08-10 DIAGNOSIS — S335XXA Sprain of ligaments of lumbar spine, initial encounter: Secondary | ICD-10-CM | POA: Insufficient documentation

## 2011-08-10 DIAGNOSIS — Y9389 Activity, other specified: Secondary | ICD-10-CM | POA: Insufficient documentation

## 2011-08-10 DIAGNOSIS — I10 Essential (primary) hypertension: Secondary | ICD-10-CM | POA: Insufficient documentation

## 2011-08-10 DIAGNOSIS — S39012A Strain of muscle, fascia and tendon of lower back, initial encounter: Secondary | ICD-10-CM

## 2011-08-10 DIAGNOSIS — Y998 Other external cause status: Secondary | ICD-10-CM | POA: Insufficient documentation

## 2011-08-10 DIAGNOSIS — X500XXA Overexertion from strenuous movement or load, initial encounter: Secondary | ICD-10-CM | POA: Insufficient documentation

## 2011-08-10 MED ORDER — IBUPROFEN 800 MG PO TABS: 800.0000 mg | ORAL_TABLET | Freq: Four times a day (QID) | ORAL | Status: AC | PRN

## 2011-08-10 MED ORDER — CYCLOBENZAPRINE HCL 5 MG PO TABS: 5.0000 mg | ORAL_TABLET | Freq: Three times a day (TID) | ORAL | Status: AC | PRN

## 2011-08-10 MED ORDER — IBUPROFEN 800 MG PO TABS
800.0000 mg | ORAL_TABLET | Freq: Once | ORAL | Status: AC
  Administered 2011-08-10: 800 mg via ORAL
  Filled 2011-08-10: qty 1

## 2011-08-10 MED ORDER — CYCLOBENZAPRINE HCL 10 MG PO TABS
5.0000 mg | ORAL_TABLET | Freq: Once | ORAL | Status: AC
  Administered 2011-08-10: 5 mg via ORAL
  Filled 2011-08-10: qty 1

## 2011-08-10 NOTE — Discharge Instructions (Signed)
Back Pain, Adult  Low back pain is very common. About 1 in 5 people have back pain.The cause of low back pain is rarely dangerous. The pain often gets better over time.About half of people with a sudden onset of back pain feel better in just 2 weeks. About 8 in 10 people feel better by 6 weeks.   CAUSES  Some common causes of back pain include:   Strain of the muscles or ligaments supporting the spine.   Wear and tear (degeneration) of the spinal discs.   Arthritis.   Direct injury to the back.  DIAGNOSIS  Most of the time, the direct cause of low back pain is not known.However, back pain can be treated effectively even when the exact cause of the pain is unknown.Answering your caregiver's questions about your overall health and symptoms is one of the most accurate ways to make sure the cause of your pain is not dangerous. If your caregiver needs more information, he or she may order lab work or imaging tests (X-rays or MRIs).However, even if imaging tests show changes in your back, this usually does not require surgery.  HOME CARE INSTRUCTIONS  For many people, back pain returns.Since low back pain is rarely dangerous, it is often a condition that people can learn to manageon their own.    Remain active. It is stressful on the back to sit or stand in one place. Do not sit, drive, or stand in one place for more than 30 minutes at a time. Take short walks on level surfaces as soon as pain allows.Try to increase the length of time you walk each day.   Do not stay in bed.Resting more than 1 or 2 days can delay your recovery.   Do not avoid exercise or work.Your body is made to move.It is not dangerous to be active, even though your back may hurt.Your back will likely heal faster if you return to being active before your pain is gone.   Pay attention to your body when you bend and lift. Many people have less discomfortwhen lifting if they bend their knees, keep the load close to their bodies,and  avoid twisting. Often, the most comfortable positions are those that put less stress on your recovering back.   Find a comfortable position to sleep. Use a firm mattress and lie on your side with your knees slightly bent. If you lie on your back, put a pillow under your knees.   Only take over-the-counter or prescription medicines as directed by your caregiver. Over-the-counter medicines to reduce pain and inflammation are often the most helpful.Your caregiver may prescribe muscle relaxant drugs.These medicines help dull your pain so you can more quickly return to your normal activities and healthy exercise.   Put ice on the injured area.   Put ice in a plastic bag.   Place a towel between your skin and the bag.   Leave the ice on for 15 to 20 minutes, 3 to 4 times a day for the first 2 to 3 days. After that, ice and heat may be alternated to reduce pain and spasms.   Ask your caregiver about trying back exercises and gentle massage. This may be of some benefit.   Avoid feeling anxious or stressed.Stress increases muscle tension and can worsen back pain.It is important to recognize when you are anxious or stressed and learn ways to manage it.Exercise is a great option.  SEEK MEDICAL CARE IF:   You have pain that is not   relieved with rest or medicine.   You have pain that does not improve in 1 week.   You have new symptoms.   You are generally not feeling well.  SEEK IMMEDIATE MEDICAL CARE IF:    You have pain that radiates from your back into your legs.   You develop new bowel or bladder control problems.   You have unusual weakness or numbness in your arms or legs.   You develop nausea or vomiting.   You develop abdominal pain.   You feel faint.  Document Released: 03/04/2005 Document Revised: 02/21/2011 Document Reviewed: 07/23/2010  ExitCare Patient Information 2012 [employer].    Lumbosacral Strain  Lumbosacral strain is one of the most common causes of back pain. There are many  causes of back pain. Most are not serious conditions.  CAUSES   Your backbone (spinal column) is made up of 24 main vertebral bodies, the sacrum, and the coccyx. These are held together by muscles and tough, fibrous tissue (ligaments). Nerve roots pass through the openings between the vertebrae. A sudden move or injury to the back may cause injury to, or pressure on, these nerves. This may result in localized back pain or pain movement (radiation) into the buttocks, down the leg, and into the foot. Sharp, shooting pain from the buttock down the back of the leg (sciatica) is frequently associated with a ruptured (herniated) disk. Pain may be caused by muscle spasm alone.  Your caregiver can often find the cause of your pain by the details of your symptoms and an exam. In some cases, you may need tests (such as X-rays). Your caregiver will work with you to decide if any tests are needed based on your specific exam.  HOME CARE INSTRUCTIONS    Avoid an underactive lifestyle. Active exercise, as directed by your caregiver, is your greatest weapon against back pain.   Avoid hard physical activities (tennis, racquetball, waterskiing) if you are not in proper physical condition for it. This may aggravate or create problems.   If you have a back problem, avoid sports requiring sudden body movements. Swimming and walking are generally safer activities.   Maintain good posture.   Avoid becoming overweight (obese).   Use bed rest for only the most extreme, sudden (acute) episode. Your caregiver will help you determine how much bed rest is necessary.   For acute conditions, you may put ice on the injured area.   Put ice in a plastic bag.   Place a towel between your skin and the bag.   Leave the ice on for 15 to 20 minutes at a time, every 2 hours, or as needed.   After you are improved and more active, it may help to apply heat for 30 minutes before activities.  See your caregiver if you are having pain that lasts  longer than expected. Your caregiver can advise appropriate exercises or therapy if needed. With conditioning, most back problems can be avoided.  SEEK IMMEDIATE MEDICAL CARE IF:    You have numbness, tingling, weakness, or problems with the use of your arms or legs.   You experience severe back pain not relieved with medicines.   There is a change in bowel or bladder control.   You have increasing pain in any area of the body, including your belly (abdomen).   You notice shortness of breath, dizziness, or feel faint.   You feel sick to your stomach (nauseous), are throwing up (vomiting), or become sweaty.   You   notice discoloration of your toes or legs, or your feet get very cold.   Your back pain is getting worse.   You have a fever.  MAKE SURE YOU:    Understand these instructions.   Will watch your condition.   Will get help right away if you are not doing well or get worse.  Document Released: 12/12/2004 Document Revised: 02/21/2011 Document Reviewed: 06/03/2008  ExitCare Patient Information 2012 [employer].

## 2011-08-10 NOTE — ED Provider Notes (Signed)
Medical screening examination/treatment/procedure(s) were performed by non-physician practitioner and as supervising physician I was immediately available for consultation/collaboration.   Naveh Rickles, MD 08/10/11 0847 

## 2011-08-10 NOTE — ED Provider Notes (Signed)
History     CSN: [id number]  Arrival date & time 08/10/11  0040   First MD Initiated Contact with Patient 08/10/11 0231      Chief Complaint  Patient presents with  . Back Pain    (Consider location/radiation/quality/duration/timing/severity/associated sxs/prior treatment) HPI Comments: Patient here with thoracic and lower back pain after lifting a box of canned goods yesterday while trying to move - she is morbidly obese and states has been trying OTC medication for this - denies weakness, numbness, tingling, loss of control of bowels or bladder - reports pain worse with movement.  Patient is a 26 y.o. female presenting with back pain. The history is provided by the patient. No language interpreter was used.  Back Pain  This is a new problem. The current episode started yesterday. The problem occurs constantly. The problem has not changed since onset.The pain is associated with lifting heavy objects. The pain is present in the thoracic spine and lumbar spine. The quality of the pain is described as stabbing and shooting. The pain is at a severity of 8/10. The pain is moderate. The symptoms are aggravated by bending and certain positions. The pain is the same all the time. Stiffness is present all day. Pertinent negatives include no chest pain, no fever, no numbness, no weight loss, no headaches, no abdominal pain, no abdominal swelling, no bowel incontinence, no perianal numbness, no bladder incontinence, no dysuria, no pelvic pain, no leg pain, no paresthesias, no paresis, no tingling and no weakness. She has tried nothing for the symptoms. The treatment provided no relief. Risk factors include obesity and lack of exercise.    Past Medical History  Diagnosis Date  . Hypertension   . Urinary tract infection   . Abscess   . Obesity     Past Surgical History  Procedure Date  . Cesarean section   . Tonsillectomy     Family History  Problem Relation Age of Onset  . Hypertension  Mother   . Diabetes Mother   . Diabetes Other   . Hypertension Other     History  Substance Use Topics  . Smoking status: Never Smoker   . Smokeless tobacco: Not on file  . Alcohol Use: No    OB History    Grav Para Term Preterm Abortions TAB SAB Ect Mult Living                  Review of Systems  Constitutional: Negative for fever and weight loss.  Cardiovascular: Negative for chest pain.  Gastrointestinal: Negative for abdominal pain and bowel incontinence.  Genitourinary: Negative for bladder incontinence, dysuria and pelvic pain.  Musculoskeletal: Positive for back pain.  Neurological: Negative for tingling, weakness, numbness, headaches and paresthesias.  All other systems reviewed and are negative.    Allergies  Review of patient's allergies indicates no known allergies.  Home Medications   Current Outpatient Rx  Name Route Sig Dispense Refill  . IBUPROFEN 200 MG PO TABS Oral Take 200 mg by mouth every 8 (eight) hours as needed. For pain    . OVER THE COUNTER MEDICATION Oral Take 1 tablet by mouth 3 times/day as needed-between meals & bedtime. Sleeping pill family dollar brand      BP 164/108  Pulse 103  Temp(Src) 97.7 F (36.5 C) (Oral)  Resp 18  SpO2 96%  Physical Exam  Nursing note and vitals reviewed. Constitutional: She is oriented to person, place, and time. She appears well-developed and well-nourished. No distress.  Morbidly obese  HENT:  Head: Normocephalic and atraumatic.  Right Ear: External ear normal.  Left Ear: External ear normal.  Nose: Nose normal.  Mouth/Throat: Oropharynx is clear and moist. No oropharyngeal exudate.  Eyes: Conjunctivae are normal. Pupils are equal, round, and reactive to light. No scleral icterus.  Neck: Normal range of motion. Neck supple. No spinous process tenderness and no muscular tenderness present.  Cardiovascular: Normal rate, regular rhythm and normal heart sounds.  Exam reveals no gallop and no  friction rub.   No murmur heard. Pulmonary/Chest: Effort normal and breath sounds normal. No respiratory distress. She has no wheezes. She has no rales. She exhibits no tenderness.  Abdominal: Soft. Bowel sounds are normal. She exhibits no distension. There is no tenderness.  Musculoskeletal:       Thoracic back: She exhibits tenderness and bony tenderness. She exhibits normal range of motion.       Lumbar back: She exhibits tenderness and bony tenderness. She exhibits normal range of motion.  Lymphadenopathy:    She has no cervical adenopathy.  Neurological: She is alert and oriented to person, place, and time. She has normal reflexes. No cranial nerve deficit. She exhibits normal muscle tone. Coordination normal.  Skin: Skin is warm and dry. No rash noted. No erythema. No pallor.  Psychiatric: She has a normal mood and affect. Her behavior is normal. Judgment and thought content normal.    ED Course  Procedures (including critical care time)  Labs Reviewed - No data to display No results found.   Lumbar strain   MDM  Patient is here with lower back pain after lifting - no alarming signs to suggest cauda equina - placed on anti-inflammatory and muscle relaxers.        Izola Price [place], [place] 08/10/11 [phone]

## 2011-08-10 NOTE — ED Notes (Signed)
C/o lower back that radiates up to midback and is worse when doing any activity, pain began yesterday after picking up a box of canned goods. Denies urinary frequency, urgency and pain.

## 2011-08-15 ENCOUNTER — Emergency Department (HOSPITAL_COMMUNITY)
Admission: EM | Admit: 2011-08-15 | Discharge: 2011-08-15 | Disposition: A | Discharge status: Home / Self Care | Payer: Self-pay | Attending: Emergency Medicine | Admitting: Emergency Medicine

## 2011-08-15 ENCOUNTER — Encounter (HOSPITAL_COMMUNITY): Payer: Self-pay | Admitting: Emergency Medicine

## 2011-08-15 DIAGNOSIS — N390 Urinary tract infection, site not specified: Secondary | ICD-10-CM | POA: Insufficient documentation

## 2011-08-15 DIAGNOSIS — R110 Nausea: Secondary | ICD-10-CM | POA: Insufficient documentation

## 2011-08-15 DIAGNOSIS — E669 Obesity, unspecified: Secondary | ICD-10-CM | POA: Insufficient documentation

## 2011-08-15 DIAGNOSIS — R109 Unspecified abdominal pain: Secondary | ICD-10-CM | POA: Insufficient documentation

## 2011-08-15 DIAGNOSIS — M549 Dorsalgia, unspecified: Secondary | ICD-10-CM | POA: Insufficient documentation

## 2011-08-15 LAB — DIFFERENTIAL
Basophils Relative: 0 % (ref 0–1)
Lymphs Abs: 3.2 10*3/uL (ref 0.7–4.0)
Monocytes Absolute: 0.7 10*3/uL (ref 0.1–1.0)
Monocytes Relative: 6 % (ref 3–12)
Neutro Abs: 7.1 10*3/uL (ref 1.7–7.7)
Neutrophils Relative %: 64 % (ref 43–77)

## 2011-08-15 LAB — BASIC METABOLIC PANEL
BUN: 14 mg/dL (ref 6–23)
Chloride: 101 mEq/L (ref 96–112)
Creatinine, Ser: 0.65 mg/dL (ref 0.50–1.10)
GFR calc Af Amer: >90 mL/min (ref >90)
Glucose, Bld: 131 mg/dL — ABNORMAL HIGH (ref 70–99)
Potassium: 3.8 mEq/L (ref 3.5–5.1)

## 2011-08-15 LAB — CBC
HCT: 37.7 % (ref 36.0–46.0)
Hemoglobin: 12.2 g/dL (ref 12.0–15.0)
MCH: 28 pg (ref 26.0–34.0)
MCHC: 32.4 g/dL (ref 30.0–36.0)
RBC: 4.35 MIL/uL (ref 3.87–5.11)

## 2011-08-15 LAB — URINALYSIS, ROUTINE W REFLEX MICROSCOPIC
Glucose, UA: NEGATIVE mg/dL
Ketones, ur: 15 mg/dL — AB
Specific Gravity, Urine: 1.034 — ABNORMAL HIGH (ref 1.005–1.030)
pH: 5 (ref 5.0–8.0)

## 2011-08-15 LAB — URINE MICROSCOPIC-ADD ON

## 2011-08-15 MED ORDER — CEFTRIAXONE SODIUM 250 MG IJ SOLR
250.0000 mg | Freq: Once | INTRAMUSCULAR | Status: DC
  Filled 2011-08-15: qty 250

## 2011-08-15 MED ORDER — CIPROFLOXACIN HCL 500 MG PO TABS
500.0000 mg | ORAL_TABLET | Freq: Once | ORAL | Status: AC
  Administered 2011-08-15: 500 mg via ORAL
  Filled 2011-08-15: qty 1

## 2011-08-15 MED ORDER — TRAMADOL HCL 50 MG PO TABS: 50.0000 mg | ORAL_TABLET | Freq: Four times a day (QID) | ORAL | Status: AC | PRN

## 2011-08-15 MED ORDER — CIPROFLOXACIN HCL 500 MG PO TABS: 500.0000 mg | ORAL_TABLET | Freq: Two times a day (BID) | ORAL | Status: AC

## 2011-08-15 MED ORDER — HYDROCODONE-ACETAMINOPHEN 5-325 MG PO TABS
1.0000 | ORAL_TABLET | Freq: Once | ORAL | Status: AC
  Administered 2011-08-15: 1 via ORAL
  Filled 2011-08-15: qty 1

## 2011-08-15 MED ORDER — ONDANSETRON HCL 4 MG PO TABS: 4.0000 mg | ORAL_TABLET | Freq: Four times a day (QID) | ORAL | Status: AC

## 2011-08-15 MED ORDER — LIDOCAINE HCL (PF) 1 % IJ SOLN
INTRAMUSCULAR | Status: AC
  Filled 2011-08-15: qty 5

## 2011-08-15 NOTE — Discharge Instructions (Signed)
You likely have a urinary tract infection today. Please take the antibiotic as prescribed until gone (take with food). You have also been prescribed Zofran for nausea. Take this as prescribed. You can continue taking the AZO. Return to the ED if you are unable to keep down liquids, run a high fever, have worsening nausea/vomiting/pain, or with otherwise worsening condition.  Urinary Tract Infection Infections of the urinary tract can start in several places. A bladder infection (cystitis), a kidney infection (pyelonephritis), and a prostate infection (prostatitis) are different types of urinary tract infections (UTIs). They usually get better if treated with medicines (antibiotics) that kill germs. Take all the medicine until it is gone. You or your child may feel better in a few days, but TAKE ALL MEDICINE or the infection may not respond and may become more difficult to treat. HOME CARE INSTRUCTIONS   Drink enough water and fluids to keep the urine clear or pale yellow. Cranberry juice is especially recommended, in addition to large amounts of water.   Avoid caffeine, tea, and carbonated beverages. They tend to irritate the bladder.   Alcohol may irritate the prostate.   Only take over-the-counter or prescription medicines for pain, discomfort, or fever as directed by your caregiver.  To prevent further infections:  Empty the bladder often. Avoid holding urine for long periods of time.   After a bowel movement, women should cleanse from front to back. Use each tissue only once.   Empty the bladder before and after sexual intercourse.  FINDING OUT THE RESULTS OF YOUR TEST Not all test results are available during your visit. If your or your child's test results are not back during the visit, make an appointment with your caregiver to find out the results. Do not assume everything is normal if you have not heard from your caregiver or the medical facility. It is important for you to follow up on  all test results. SEEK MEDICAL CARE IF:   There is back pain.   Your baby is older than 3 months with a rectal temperature of 100.5 F (38.1 C) or higher for more than 1 day.   Your or your child's problems (symptoms) are no better in 3 days. Return sooner if you or your child is getting worse.  SEEK IMMEDIATE MEDICAL CARE IF:   There is severe back pain or lower abdominal pain.   You or your child develops chills.   You have a fever.   Your baby is older than 3 months with a rectal temperature of 102 F (38.9 C) or higher.   Your baby is 72 months old or younger with a rectal temperature of 100.4 F (38 C) or higher.   There is nausea or vomiting.   There is continued burning or discomfort with urination.  MAKE SURE YOU:   Understand these instructions.   Will watch your condition.   Will get help right away if you are not doing well or get worse.  Document Released: 12/12/2004 Document Revised: 02/21/2011 Document Reviewed: 07/17/2006 [facility] Patient Information 2012 [place], [place].

## 2011-08-15 NOTE — ED Provider Notes (Signed)
History     CSN: [id number]  Arrival date & time 08/15/11  2004   First MD Initiated Contact with Patient 08/15/11 2058      Chief Complaint  Patient presents with  . Back Pain    (Consider location/radiation/quality/duration/timing/severity/associated sxs/prior treatment) HPI History from patient. 26 year old female who presents with dysuria and back pain. She states she was seen for the back pain several days ago. She had recently been lifting heavy boxes prior to the previous evaluation and was told that this was likely a lumbar strain. She was given prescriptions for ibuprofen and a muscle relaxer, which she did not have filled. She states over the past several days, she has developed increased urinary frequency and dysuria. Additionally, the pain in her back has persisted with occasional radiation into the R abd. She has not had fever or chills. Reports nausea without vomiting and decreased appetite.  Past Medical History  Diagnosis Date  . Hypertension   . Urinary tract infection   . Abscess   . Obesity     Past Surgical History  Procedure Date  . Cesarean section   . Tonsillectomy     Family History  Problem Relation Age of Onset  . Hypertension Mother   . Diabetes Mother   . Diabetes Other   . Hypertension Other     History  Substance Use Topics  . Smoking status: Never Smoker   . Smokeless tobacco: Not on file  . Alcohol Use: No    OB History    Grav Para Term Preterm Abortions TAB SAB Ect Mult Living                  Review of Systems  Constitutional: Negative for fever, chills, activity change and appetite change.  HENT: Negative.   Respiratory: Negative for cough, shortness of breath and wheezing.   Cardiovascular: Negative for chest pain.  Gastrointestinal: Positive for nausea and abdominal pain. Negative for vomiting, diarrhea and constipation.  Musculoskeletal: Positive for back pain.  All other systems reviewed and are  negative.    Allergies  Review of patient's allergies indicates no known allergies.  Home Medications   Current Outpatient Rx  Name Route Sig Dispense Refill  . IBUPROFEN 200 MG PO TABS Oral Take 800 mg by mouth every 8 (eight) hours as needed. For pain    . OVER THE COUNTER MEDICATION Oral Take 1 tablet by mouth every other day. Sleeping pill family dollar brand    . AZO TABS PO Oral Take 2 tablets by mouth 3 (three) times daily.    . CYCLOBENZAPRINE HCL 5 MG PO TABS Oral Take 1 tablet (5 mg total) by mouth 3 (three) times daily as needed for muscle spasms. 30 tablet 0  . IBUPROFEN 800 MG PO TABS Oral Take 1 tablet (800 mg total) by mouth every 6 (six) hours as needed for pain. 30 tablet 0    BP 174/105  Pulse 121  Temp(Src) 98.1 F (36.7 C) (Oral)  Resp 18  SpO2 100%  LMP 07/03/2011  Physical Exam  Nursing note and vitals reviewed. Constitutional: She appears well-developed and well-nourished. No distress.       Obese  HENT:  Head: Normocephalic and atraumatic.  Neck: Normal range of motion.  Cardiovascular: Normal rate, regular rhythm and normal heart sounds.   Pulmonary/Chest: Effort normal and breath sounds normal.  Abdominal:       Abdomen obese. Minimally tender to palpation on the right side. No rebound or  guarding or localization of pain. Negative obturator/Rovsing. Positive right CVA tenderness.  Musculoskeletal: Normal range of motion.       No midline spine tenderness.  Neurological: She is alert.  Skin: Skin is warm and dry. She is not diaphoretic.  Psychiatric: She has a normal mood and affect.    ED Course  Procedures (including critical care time)  Labs Reviewed  URINALYSIS, ROUTINE W REFLEX MICROSCOPIC - Abnormal; Notable for the following:    Color, Urine ORANGE (*) BIOCHEMICALS MAY BE AFFECTED BY COLOR   APPearance CLOUDY (*)    Specific Gravity, Urine 1.034 (*)    Bilirubin Urine SMALL (*)    Ketones, ur 15 (*)    Nitrite POSITIVE (*)     Leukocytes, UA SMALL (*)    All other components within normal limits  CBC - Abnormal; Notable for the following:    WBC 11.2 (*)    All other components within normal limits  BASIC METABOLIC PANEL - Abnormal; Notable for the following:    Glucose, Bld 131 (*)    All other components within normal limits  URINE MICROSCOPIC-ADD ON - Abnormal; Notable for the following:    Squamous Epithelial / LPF MANY (*)    Bacteria, UA FEW (*)    All other components within normal limits  DIFFERENTIAL  POCT PREGNANCY, URINE   No results found.   1. Urinary tract infection       MDM  26 year old female presents with back pain, dysuria, nausea for the past several days. Pain has progressed over the past several days with occasional radiation into the right side. Her abdomen is soft and I do not suspect acute surgical abdomen based on my exam. Her urine, although contaminated with squamous cells, is nitrite positive with small leukocytes. Will cover for UTI. The remainder of her labs are unremarkable appearing. Urine sent for culture. Findings discussed the patient. Reasons to return discussed. Patient verbalized understanding and was agreeable to plan.        Vanita [place], [place] 08/16/11 0140

## 2011-08-15 NOTE — ED Notes (Signed)
PT. REPORTS LOW BACK PAIN ONSET LAST WEEK DUE TO HEAVY LIFTING SEEN HERE LAST Friday DIAGNOSED WITH BACK STRAIN , ALSO REPORTS STRONG ODOR OF URINE AND LOW ABDOMINAL PAIN FOR 3 DAYS WITH SLIGHT NAUSEA.

## 2011-08-15 NOTE — ED Notes (Signed)
Pt presents w/ lower back pain x1 week, pt states she was seen for this however thought she had strained her lower back d/t heavy lifting. Pt now recently experiencing lower abd pain as well w/ mild nausea, denies vomiting. Pt w/ hx of UTI and has been taking OTC AZO w/o relief. Pt describes having urinary urgency and frequency - denies hemturia or dysuria. Pt w/ left CVA tenderness.

## 2011-08-15 NOTE — ED Notes (Signed)
Rx given x3 D/c instructions reviewed w/ pt and family - pt and family deny any further questions or concerns at present. Pt ambulating independently w/ steady gait on d/c in no acute distress, A&Ox4.  

## 2011-08-16 LAB — URINE CULTURE: Culture  Setup Time: 201305302303

## 2011-08-19 NOTE — ED Provider Notes (Signed)
Medical screening examination/treatment/procedure(s) were performed by non-physician practitioner and as supervising physician I was immediately available for consultation/collaboration.  Flint Melter, MD 08/19/11 1525

## 2011-10-16 ENCOUNTER — Encounter (HOSPITAL_COMMUNITY): Payer: Self-pay | Admitting: *Deleted

## 2011-10-16 ENCOUNTER — Emergency Department (HOSPITAL_COMMUNITY)
Admission: EM | Admit: 2011-10-16 | Discharge: 2011-10-16 | Disposition: A | Discharge status: Home / Self Care | Payer: Medicaid Other | Attending: Emergency Medicine | Admitting: Emergency Medicine

## 2011-10-16 DIAGNOSIS — K089 Disorder of teeth and supporting structures, unspecified: Secondary | ICD-10-CM | POA: Insufficient documentation

## 2011-10-16 DIAGNOSIS — Z9119 Patient's noncompliance with other medical treatment and regimen: Secondary | ICD-10-CM | POA: Insufficient documentation

## 2011-10-16 DIAGNOSIS — Z91199 Patient's noncompliance with other medical treatment and regimen due to unspecified reason: Secondary | ICD-10-CM | POA: Insufficient documentation

## 2011-10-16 DIAGNOSIS — I10 Essential (primary) hypertension: Secondary | ICD-10-CM | POA: Insufficient documentation

## 2011-10-16 DIAGNOSIS — K0889 Other specified disorders of teeth and supporting structures: Secondary | ICD-10-CM

## 2011-10-16 MED ORDER — HYDROCODONE-ACETAMINOPHEN 5-325 MG PO TABS: 1.0000 | ORAL_TABLET | ORAL | Status: AC | PRN

## 2011-10-16 MED ORDER — HYDROCHLOROTHIAZIDE 25 MG PO TABS: 25.0000 mg | ORAL_TABLET | Freq: Every day | ORAL | Status: DC

## 2011-10-16 MED ORDER — PENICILLIN V POTASSIUM 500 MG PO TABS: 500.0000 mg | ORAL_TABLET | Freq: Four times a day (QID) | ORAL | Status: AC

## 2011-10-16 NOTE — ED Provider Notes (Signed)
History     CSN: [id number]  Arrival date & time 10/16/11  4098   First MD Initiated Contact with Patient 10/16/11 1038      Chief Complaint  Patient presents with  . Dental Pain    (Consider location/radiation/quality/duration/timing/severity/associated sxs/prior treatment) HPI Comments: Pt reports to the ED today with complaints of dental pain. The pain started a few days ago and is a dull, constant pain on the right lower molar with radiation to her right jaw and right ear. She reports that the pain has worsened over the past few days and is experiencing difficulty chewing. She came to the ED in April with dental pain and was treated with antibiotics for a dental abscess. This pain went away with the medication but has now returned. The pain she is experiencing now is similar to the pain she had in April. She does not have dental insurance and does not see a dentist. Pt denies fever, sore throat, difficulty swallowing, cough, or shortness of breath. She has experienced a slight headache that she believes is related to her tooth pain.   Patient is a 26 y.o. female presenting with tooth pain. The history is provided by the patient.  Dental PainPrimary symptoms do not include fever, shortness of breath or sore throat.  Additional symptoms do not include: trouble swallowing and drooling.    Past Medical History  Diagnosis Date  . Hypertension   . Urinary tract infection   . Abscess   . Obesity     Past Surgical History  Procedure Date  . Cesarean section   . Tonsillectomy     Family History  Problem Relation Age of Onset  . Hypertension Mother   . Diabetes Mother   . Diabetes Other   . Hypertension Other     History  Substance Use Topics  . Smoking status: Never Smoker   . Smokeless tobacco: Not on file  . Alcohol Use: No    OB History    Grav Para Term Preterm Abortions TAB SAB Ect Mult Living                  Review of Systems  Constitutional: Negative for  fever and chills.  HENT: Positive for dental problem. Negative for sore throat, drooling, mouth sores, trouble swallowing and neck stiffness.   Respiratory: Negative for shortness of breath, wheezing and stridor.     Allergies  Review of patient's allergies indicates no known allergies.  Home Medications   Current Outpatient Rx  Name Route Sig Dispense Refill  . IBUPROFEN 200 MG PO TABS Oral Take 800 mg by mouth every 8 (eight) hours as needed. For pain    . OVER THE COUNTER MEDICATION Oral Take 1 tablet by mouth every other day. Sleeping pill family dollar brand    . AZO TABS PO Oral Take 2 tablets by mouth 3 (three) times daily.      BP 157/106  Pulse 100  Temp 98.5 F (36.9 C) (Oral)  Resp 20  SpO2 98%  Physical Exam  Nursing note and vitals reviewed. Constitutional: She appears well-developed and well-nourished. No distress.  HENT:  Head: Normocephalic and atraumatic.  Mouth/Throat: Uvula is midline and oropharynx is clear and moist. Mucous membranes are not dry. No uvula swelling. No oropharyngeal exudate, posterior oropharyngeal edema, posterior oropharyngeal erythema or tonsillar abscesses.    Neck: Neck supple.  Cardiovascular: Normal rate and regular rhythm.   Pulmonary/Chest: Effort normal and breath sounds normal. No stridor. No  respiratory distress. She has no wheezes. She has no rales.  Lymphadenopathy:    She has no cervical adenopathy.  Neurological: She is alert.  Skin: She is not diaphoretic.    ED Course  Procedures (including critical care time)  Labs Reviewed - No data to display No results found.  Filed Vitals:   10/16/11 1001  BP: 157/106  Pulse: 100  Temp: 98.5 F (36.9 C)  Resp: 20   Filed Vitals:   10/16/11 1001  BP: 157/106  Pulse: 100  Temp: 98.5 F (36.9 C)  Resp: 20    12:00 PM Patient with hypertension, hx hypertension, previously on medications but not recently.  No CP, SOB.  No AMS.    1. Pain, dental   2. Hypertension        MDM  Pt with recurrent dental pain - had pain and dx with abscess involving same tooth (3rd right lower molar) 6 months ago without dental follow up.  No with recurrent pain, tender to percussion.  No obvious abscess, no clinical concern for Ludwig's angina or deeper infection.  Pt also with hypertension, known to have hypertension - has been off medication for awhile, not monitoring pressure.  Pt is in pain, but with known hypertension and diastolic above 100 x 2, I have chosen to start HCTZ and have patient recheck blood pressure within one week.  PCP resources given for follow up.  Pt given information on dental pain and HTN, dental referral given.  Discussed importance of dental follow up.  Return precautions given.  Pt verbalizes understanding and agrees with plan.           [place], [place] 10/16/11 [phone]

## 2011-10-16 NOTE — ED Notes (Signed)
To ED for eval of left side face pain and tooth pain for the past 3-4 days. Unknown if she has a broken tooth.

## 2011-10-16 NOTE — ED Provider Notes (Signed)
Medical screening examination/treatment/procedure(s) were performed by non-physician practitioner and as supervising physician I was immediately available for consultation/collaboration.   Carleene Cooper III, MD 10/16/11 [phone]

## 2011-12-13 ENCOUNTER — Other Ambulatory Visit: Payer: Self-pay

## 2011-12-13 ENCOUNTER — Encounter (HOSPITAL_COMMUNITY): Payer: Self-pay | Admitting: Emergency Medicine

## 2011-12-13 ENCOUNTER — Emergency Department (HOSPITAL_COMMUNITY)
Admission: EM | Admit: 2011-12-13 | Discharge: 2011-12-13 | Disposition: A | Discharge status: Home / Self Care | Payer: Medicaid Other | Attending: Emergency Medicine | Admitting: Emergency Medicine

## 2011-12-13 ENCOUNTER — Emergency Department (HOSPITAL_COMMUNITY): Payer: Medicaid Other

## 2011-12-13 DIAGNOSIS — R071 Chest pain on breathing: Secondary | ICD-10-CM | POA: Insufficient documentation

## 2011-12-13 DIAGNOSIS — I10 Essential (primary) hypertension: Secondary | ICD-10-CM

## 2011-12-13 DIAGNOSIS — J069 Acute upper respiratory infection, unspecified: Secondary | ICD-10-CM

## 2011-12-13 DIAGNOSIS — R0789 Other chest pain: Secondary | ICD-10-CM

## 2011-12-13 MED ORDER — IBUPROFEN 600 MG PO TABS: 600.0000 mg | ORAL_TABLET | Freq: Four times a day (QID) | ORAL | Status: DC | PRN

## 2011-12-13 MED ORDER — CYCLOBENZAPRINE HCL 10 MG PO TABS: 10.0000 mg | ORAL_TABLET | Freq: Two times a day (BID) | ORAL | Status: DC | PRN

## 2011-12-13 NOTE — ED Provider Notes (Signed)
History     CSN: [id number]  Arrival date & time 12/13/11  2125   First MD Initiated Contact with Patient 12/13/11 2230      Chief Complaint  Patient presents with  . Sore Throat    (Consider location/radiation/quality/duration/timing/severity/associated sxs/prior treatment) HPI Comments: R sided CP worse with movement for the past several days associated with recent URI symptoms that started 2 weeks ago taking OTC meds without relief   Patient is a 25 y.o. female presenting with pharyngitis. The history is provided by the patient.  Sore Throat This is a new problem. The current episode started 1 to 4 weeks ago. The problem occurs intermittently. The problem has been unchanged. Associated symptoms include chest pain, coughing and a sore throat. Pertinent negatives include no abdominal pain, anorexia, arthralgias, change in bowel habit, congestion, fever, headaches, myalgias, nausea, rash, swollen glands, vomiting or weakness.    Past Medical History  Diagnosis Date  . Hypertension   . Urinary tract infection   . Abscess   . Obesity     Past Surgical History  Procedure Date  . Cesarean section   . Tonsillectomy     Family History  Problem Relation Age of Onset  . Hypertension Mother   . Diabetes Mother   . Diabetes Other   . Hypertension Other     History  Substance Use Topics  . Smoking status: Never Smoker   . Smokeless tobacco: Not on file  . Alcohol Use: No    OB History    Grav Para Term Preterm Abortions TAB SAB Ect Mult Living                  Review of Systems  Constitutional: Negative for fever.  HENT: Positive for sore throat. Negative for congestion.   Respiratory: Positive for cough. Negative for shortness of breath.   Cardiovascular: Positive for chest pain.  Gastrointestinal: Negative for nausea, vomiting, abdominal pain, anorexia and change in bowel habit.  Musculoskeletal: Negative for myalgias and arthralgias.  Skin: Negative for rash.    Neurological: Negative for dizziness, weakness and headaches.    Allergies  Review of patient's allergies indicates no known allergies.  Home Medications   Current Outpatient Rx  Name Route Sig Dispense Refill  . GUAIFENESIN 100 MG/5ML PO LIQD Oral Take 200 mg by mouth every 6 (six) hours as needed. For cough Brand name:Mucinex    . HYDROCHLOROTHIAZIDE 25 MG PO TABS Oral Take 25 mg by mouth every other day.    . IBUPROFEN 200 MG PO TABS Oral Take 800 mg by mouth every 6 (six) hours as needed. For pain    . CYCLOBENZAPRINE HCL 10 MG PO TABS Oral Take 1 tablet (10 mg total) by mouth 2 (two) times daily as needed for muscle spasms. 20 tablet 0  . IBUPROFEN 600 MG PO TABS Oral Take 1 tablet (600 mg total) by mouth every 6 (six) hours as needed for pain. 30 tablet 0    BP 153/106  Pulse 98  Temp 97.8 F (36.6 C) (Oral)  Resp 18  SpO2 98%  LMP 11/15/2011  Physical Exam  Constitutional: She is oriented to person, place, and time. She appears well-developed and well-nourished.       Morbidly obese  HENT:  Head: Normocephalic.  Mouth/Throat: Uvula is midline. No oropharyngeal exudate, posterior oropharyngeal edema, posterior oropharyngeal erythema or tonsillar abscesses.  Eyes: Pupils are equal, round, and reactive to light.  Neck: Normal range of motion.  Cardiovascular:  Normal rate.        hypertensive  Pulmonary/Chest: Effort normal. No respiratory distress. She exhibits tenderness.  Musculoskeletal: Normal range of motion. She exhibits edema. She exhibits no tenderness.  Neurological: She is alert and oriented to person, place, and time.  Skin: Skin is warm.    ED Course  Procedures (including critical care time)  Labs Reviewed - No data to display No results found.   1. Chest wall pain   2. URI (upper respiratory infection)   3. Hypertension       MDM  reproducable R CP worse with arm movement and deep breath  Normal xray        Arman Filter,  NP 12/13/11 2317

## 2011-12-13 NOTE — ED Notes (Signed)
Pt states sore throat for the past week. Pt states that she is also have chest discomfort but not sharp pain. Pt denies difficulty eating or drinking. Pt just states she feel bad. Pt alert and oriented able to follow commands and move extremities.

## 2011-12-13 NOTE — ED Notes (Signed)
C/o sore throat, non-productive cough, and R sided chest pain x 1 week.  Denies sob, nausea, and vomiting.  Pain worse when lying down at night.

## 2011-12-14 NOTE — ED Provider Notes (Signed)
Medical screening examination/treatment/procedure(s) were performed by non-physician practitioner and as supervising physician I was immediately available for consultation/collaboration.  Letonia Stead R. Jeffifer Rabold, MD 12/14/11 2256 

## 2011-12-19 ENCOUNTER — Encounter (HOSPITAL_COMMUNITY): Payer: Self-pay | Admitting: Emergency Medicine

## 2011-12-19 ENCOUNTER — Emergency Department (HOSPITAL_COMMUNITY): Payer: Medicaid Other

## 2011-12-19 ENCOUNTER — Emergency Department (HOSPITAL_COMMUNITY)
Admission: EM | Admit: 2011-12-19 | Discharge: 2011-12-19 | Disposition: A | Discharge status: Home / Self Care | Payer: Medicaid Other | Attending: Emergency Medicine | Admitting: Emergency Medicine

## 2011-12-19 DIAGNOSIS — M940 Chondrocostal junction syndrome [Tietze]: Secondary | ICD-10-CM | POA: Insufficient documentation

## 2011-12-19 DIAGNOSIS — Z79899 Other long term (current) drug therapy: Secondary | ICD-10-CM | POA: Insufficient documentation

## 2011-12-19 DIAGNOSIS — R05 Cough: Secondary | ICD-10-CM | POA: Insufficient documentation

## 2011-12-19 DIAGNOSIS — R059 Cough, unspecified: Secondary | ICD-10-CM | POA: Insufficient documentation

## 2011-12-19 DIAGNOSIS — R079 Chest pain, unspecified: Secondary | ICD-10-CM | POA: Insufficient documentation

## 2011-12-19 DIAGNOSIS — I10 Essential (primary) hypertension: Secondary | ICD-10-CM | POA: Insufficient documentation

## 2011-12-19 DIAGNOSIS — R070 Pain in throat: Secondary | ICD-10-CM | POA: Insufficient documentation

## 2011-12-19 LAB — URINALYSIS, ROUTINE W REFLEX MICROSCOPIC
Bilirubin Urine: NEGATIVE
Glucose, UA: >1000 mg/dL — AB
Hgb urine dipstick: NEGATIVE
Ketones, ur: NEGATIVE mg/dL
Leukocytes, UA: NEGATIVE
Protein, ur: NEGATIVE mg/dL
pH: 5.5 (ref 5.0–8.0)

## 2011-12-19 LAB — CBC WITH DIFFERENTIAL/PLATELET
Basophils Absolute: 0 10*3/uL (ref 0.0–0.1)
Basophils Relative: 0 % (ref 0–1)
Eosinophils Relative: 2 % (ref 0–5)
HCT: 36.5 % (ref 36.0–46.0)
Hemoglobin: 12 g/dL (ref 12.0–15.0)
Lymphocytes Relative: 26 % (ref 12–46)
MCHC: 32.9 g/dL (ref 30.0–36.0)
MCV: 88.2 fL (ref 78.0–100.0)
Monocytes Absolute: 0.6 10*3/uL (ref 0.1–1.0)
Monocytes Relative: 6 % (ref 3–12)
Neutro Abs: 6.9 10*3/uL (ref 1.7–7.7)
RDW: 14.2 % (ref 11.5–15.5)

## 2011-12-19 LAB — COMPREHENSIVE METABOLIC PANEL
BUN: 10 mg/dL (ref 6–23)
CO2: 23 mEq/L (ref 19–32)
Calcium: 9.3 mg/dL (ref 8.4–10.5)
Chloride: 99 mEq/L (ref 96–112)
Creatinine, Ser: 0.68 mg/dL (ref 0.50–1.10)
GFR calc non Af Amer: >90 mL/min (ref >90)
Total Bilirubin: 0.4 mg/dL (ref 0.3–1.2)

## 2011-12-19 LAB — POCT PREGNANCY, URINE: Preg Test, Ur: NEGATIVE

## 2011-12-19 LAB — TROPONIN I: Troponin I: <0.3 ng/mL (ref <0.30)

## 2011-12-19 LAB — D-DIMER, QUANTITATIVE: D-Dimer, Quant: 0.33 ug/mL-FEU (ref 0.00–0.48)

## 2011-12-19 MED ORDER — KETOROLAC TROMETHAMINE 30 MG/ML IJ SOLN
30.0000 mg | Freq: Once | INTRAMUSCULAR | Status: AC
  Administered 2011-12-19: 30 mg via INTRAVENOUS
  Filled 2011-12-19: qty 1

## 2011-12-19 MED ORDER — SODIUM CHLORIDE 0.9 % IV BOLUS (SEPSIS)
1000.0000 mL | Freq: Once | INTRAVENOUS | Status: AC
  Administered 2011-12-19: 1000 mL via INTRAVENOUS

## 2011-12-19 MED ORDER — GI COCKTAIL ~~LOC~~
30.0000 mL | Freq: Once | ORAL | Status: AC
  Administered 2011-12-19: 30 mL via ORAL
  Filled 2011-12-19: qty 30

## 2011-12-19 MED ORDER — NAPROXEN 500 MG PO TABS: 500.0000 mg | ORAL_TABLET | Freq: Two times a day (BID) | ORAL | Status: DC

## 2011-12-19 MED ORDER — TRAMADOL HCL 50 MG PO TABS: 50.0000 mg | ORAL_TABLET | Freq: Four times a day (QID) | ORAL | Status: DC | PRN

## 2011-12-19 NOTE — ED Notes (Signed)
Pt states that she was having chest pain and went to [facility] on Friday. Pt states she was told she had an URI and was prescribed flexeril and ibuprofen. She states she quit taking flexiril bc made her drowsy and continued to take the ibuprofen but hasn't helped. Pt went to her her PCP today and was instructed to come here bc they didn't know what was wrong with her. Pt states the pain is "like bronchitis".  Pt denies frequent cough, but dry whenever she does.

## 2011-12-19 NOTE — ED Notes (Signed)
PA at bedside.

## 2011-12-19 NOTE — ED Notes (Signed)
Was seen at hospital last week for URI-- given "flexeril, ibuprofen, no improvement" saw Dr. Bruna Potter this am, was told to come here. EKG done on arrival-- shown to dr.

## 2011-12-19 NOTE — ED Provider Notes (Signed)
History     CSN: [id number]  Arrival date & time 12/19/11  1402   First MD Initiated Contact with Patient 12/19/11 1604      Chief Complaint  Patient presents with  . Chest Pain    (Consider location/radiation/quality/duration/timing/severity/associated sxs/prior treatment) The history is provided by the patient and medical records.    Vanessa Morton is a 26 y.o. female presents to the emergency department complaining of chest pain.  The onset of the symptoms was  gradual starting 7 days ago.  The patient has associated sore throat, dry cough.  The symptoms have been  constant, stabilized.  nothing makes the symptoms worse and nothing makes symptoms better.  The patient denies fever, chills, neck pain, headache, nausea, vomiting, diaphoresis, diarrhea, weakness, syncope, numbness.  Pt states pain began last week.  She was evaluated in the ED 5 days ago and diagnosed with a URI.  Pt was d/c'ed with flexeril and ibuprofen.  She states the flexeril made her very sleepy and did not help her pain; therefore she stopped taking the medication.  She states she was asked to f/u with her PCP and she did so today with Dr Bruna Potter.  Dr Calbert Hulsebus Beat told her " I don't know what to do for you, therefore you should go to the emergency room and let them figure it out."  The patient then presented here for further evaluation.  Patient denies all contraceptive, recent surgery, immobilization, long trips.  Assessment history of MI or other cardiac history.    Past Medical History  Diagnosis Date  . Hypertension   . Urinary tract infection   . Abscess   . Obesity     Past Surgical History  Procedure Date  . Cesarean section   . Tonsillectomy     Family History  Problem Relation Age of Onset  . Hypertension Mother   . Diabetes Mother   . Diabetes Other   . Hypertension Other     History  Substance Use Topics  . Smoking status: Never Smoker   . Smokeless tobacco: Not on file  . Alcohol Use: No     OB History    Grav Para Term Preterm Abortions TAB SAB Ect Mult Living                  Review of Systems  Constitutional: Negative for fever, diaphoresis, appetite change, fatigue and unexpected weight change.  HENT: Positive for sore throat. Negative for mouth sores and neck stiffness.   Eyes: Negative for visual disturbance.  Respiratory: Positive for cough. Negative for chest tightness, shortness of breath and wheezing.   Cardiovascular: Positive for chest pain.  Gastrointestinal: Negative for nausea, vomiting, abdominal pain, diarrhea and constipation.  Genitourinary: Negative for dysuria, urgency, frequency and hematuria.  Skin: Negative for rash.  Neurological: Negative for syncope, light-headedness and headaches.  Psychiatric/Behavioral: Negative for disturbed wake/sleep cycle. The patient is not nervous/anxious.   All other systems reviewed and are negative.    Allergies  Review of patient's allergies indicates no known allergies.  Home Medications   Current Outpatient Rx  Name Route Sig Dispense Refill  . ACETAMINOPHEN 500 MG PO TABS Oral Take 1,000 mg by mouth every 6 (six) hours as needed. For pain.    Marland Kitchen HYDROCHLOROTHIAZIDE 25 MG PO TABS Oral Take 25 mg by mouth every other day.    . IBUPROFEN 600 MG PO TABS Oral Take 1 tablet (600 mg total) by mouth every 6 (six) hours as needed  for pain. 30 tablet 0  . NAPROXEN 500 MG PO TABS Oral Take 1 tablet (500 mg total) by mouth 2 (two) times daily with a meal. 30 tablet 0  . TRAMADOL HCL 50 MG PO TABS Oral Take 1 tablet (50 mg total) by mouth every 6 (six) hours as needed for pain. 15 tablet 0    BP 148/90  Pulse 112  Temp 98.5 F (36.9 C)  Resp 16  SpO2 99%  LMP 11/15/2011  Physical Exam  Nursing note and vitals reviewed. Constitutional: She is oriented to person, place, and time. She appears well-developed and well-nourished. No distress.       Morbidly obese  HENT:  Head: Normocephalic and atraumatic.   Mouth/Throat: Oropharynx is clear and moist. No oropharyngeal exudate.  Eyes: Conjunctivae normal are normal. No scleral icterus.  Neck: Normal range of motion. Neck supple.  Cardiovascular: Normal rate, regular rhythm, normal heart sounds and intact distal pulses.  Exam reveals no gallop and no friction rub.   No murmur heard. Pulmonary/Chest: Effort normal and breath sounds normal. No respiratory distress. She has no wheezes. She has no rales. She exhibits tenderness.       significantly tender to palpation on the right costovertebral angles.  Abdominal: Soft. Bowel sounds are normal. She exhibits no mass. There is no tenderness. There is no rebound and no guarding.  Musculoskeletal: Normal range of motion. She exhibits no edema.       No swelling or pain to palpation of the calves No palpable cord Negative Homans sign  Neurological: She is alert and oriented to person, place, and time. She exhibits normal muscle tone. Coordination normal.       Speech is clear and goal oriented Moves extremities without ataxia  Skin: Skin is warm and dry. No rash noted. She is not diaphoretic.  Psychiatric: She has a normal mood and affect.    ED Course  Procedures (including critical care time)  Labs Reviewed  URINALYSIS, ROUTINE W REFLEX MICROSCOPIC - Abnormal; Notable for the following:    APPearance TURBID (*)     Specific Gravity, Urine 1.038 (*)     Glucose, UA >1000 (*)     All other components within normal limits  COMPREHENSIVE METABOLIC PANEL - Abnormal; Notable for the following:    Sodium 134 (*)     Glucose, Bld 267 (*)     All other components within normal limits  URINE MICROSCOPIC-ADD ON - Abnormal; Notable for the following:    Squamous Epithelial / LPF MANY (*)     Bacteria, UA MANY (*)     All other components within normal limits  CBC WITH DIFFERENTIAL  POCT PREGNANCY, URINE  TROPONIN I  D-DIMER, QUANTITATIVE  URINE CULTURE   Dg Chest 2 View  12/19/2011  *RADIOLOGY  REPORT*  Clinical Data: Chest pain and shortness of breath.  CHEST - 2 VIEW  Comparison: 12/13/2011.  Findings: The heart is borderline enlarged but stable.  Low lung volumes with vascular crowding and streaky atelectasis.  No infiltrates or effusions.  The bony thorax is intact but  IMPRESSION: Stable borderline cardiac size.  No acute pulmonary findings.   Original Report Authenticated By: P. Loralie Champagne, M.D.     Results for orders placed during the hospital encounter of 12/19/11  URINALYSIS, ROUTINE W REFLEX MICROSCOPIC      Component Value Range   Color, Urine YELLOW  YELLOW   APPearance TURBID (*) CLEAR   Specific Gravity, Urine 1.038 (*)  1.005 - 1.030   pH 5.5  5.0 - 8.0   Glucose, UA >1000 (*) NEGATIVE mg/dL   Hgb urine dipstick NEGATIVE  NEGATIVE   Bilirubin Urine NEGATIVE  NEGATIVE   Ketones, ur NEGATIVE  NEGATIVE mg/dL   Protein, ur NEGATIVE  NEGATIVE mg/dL   Urobilinogen, UA 0.2  0.0 - 1.0 mg/dL   Nitrite NEGATIVE  NEGATIVE   Leukocytes, UA NEGATIVE  NEGATIVE  CBC WITH DIFFERENTIAL      Component Value Range   WBC 10.3  4.0 - 10.5 K/uL   RBC 4.14  3.87 - 5.11 MIL/uL   Hemoglobin 12.0  12.0 - 15.0 g/dL   HCT 16.1  09.6 - 04.5 %   MCV 88.2  78.0 - 100.0 fL   MCH 29.0  26.0 - 34.0 pg   MCHC 32.9  30.0 - 36.0 g/dL   RDW 40.9  81.1 - 91.4 %   Platelets 360  150 - 400 K/uL   Neutrophils Relative 66  43 - 77 %   Neutro Abs 6.9  1.7 - 7.7 K/uL   Lymphocytes Relative 26  12 - 46 %   Lymphs Abs 2.7  0.7 - 4.0 K/uL   Monocytes Relative 6  3 - 12 %   Monocytes Absolute 0.6  0.1 - 1.0 K/uL   Eosinophils Relative 2  0 - 5 %   Eosinophils Absolute 0.2  0.0 - 0.7 K/uL   Basophils Relative 0  0 - 1 %   Basophils Absolute 0.0  0.0 - 0.1 K/uL  COMPREHENSIVE METABOLIC PANEL      Component Value Range   Sodium 134 (*) 135 - 145 mEq/L   Potassium 3.8  3.5 - 5.1 mEq/L   Chloride 99  96 - 112 mEq/L   CO2 23  19 - 32 mEq/L   Glucose, Bld 267 (*) 70 - 99 mg/dL   BUN 10  6 - 23  mg/dL   Creatinine, Ser 7.82  0.50 - 1.10 mg/dL   Calcium 9.3  8.4 - 95.6 mg/dL   Total Protein 7.4  6.0 - 8.3 g/dL   Albumin 3.7  3.5 - 5.2 g/dL   AST 16  0 - 37 U/L   ALT 21  0 - 35 U/L   Alkaline Phosphatase 61  39 - 117 U/L   Total Bilirubin 0.4  0.3 - 1.2 mg/dL   GFR calc non Af Amer >90  >90 mL/min   GFR calc Af Amer >90  >90 mL/min  POCT PREGNANCY, URINE      Component Value Range   Preg Test, Ur NEGATIVE  NEGATIVE  TROPONIN I      Component Value Range   Troponin I <0.30  <0.30 ng/mL  D-DIMER, QUANTITATIVE      Component Value Range   D-Dimer, Quant 0.33  0.00 - 0.48 ug/mL-FEU  URINE MICROSCOPIC-ADD ON      Component Value Range   Squamous Epithelial / LPF MANY (*) RARE   Bacteria, UA MANY (*) RARE   Urine-Other MUCOUS PRESENT     Dg Chest 2 View  12/19/2011  *RADIOLOGY REPORT*  Clinical Data: Chest pain and shortness of breath.  CHEST - 2 VIEW  Comparison: 12/13/2011.  Findings: The heart is borderline enlarged but stable.  Low lung volumes with vascular crowding and streaky atelectasis.  No infiltrates or effusions.  The bony thorax is intact but  IMPRESSION: Stable borderline cardiac size.  No acute pulmonary findings.   Original Report  Authenticated By: Demetrius Charity. Loralie Champagne, M.D.     ECG:  Date: 12/19/2011  Rate: 104  Rhythm: sinus tachycardia  QRS Axis: normal  Intervals: normal  ST/T Wave abnormalities: nonspecific T wave changes  Conduction Disutrbances:none  Narrative Interpretation: Sinus rhythm with T wave inversion V1-V3, more pronounced in V3 thank in previous ECG  Old EKG Reviewed: changes noted    1. Costochondritis       MDM  Sela Hilding presents with 7 days of chest pain.  The patient is low risk for PE based on the Wells criteria.  Patient not per negative based on elevated heart rate.  D-dimer negative.  CBC unremarkable, CMP remarkable for elevated glucose at 267. Urinalysis with contaminated urine sample.  Troponin negative.  Chest x-ray  without evidence of infiltrates. EKG without concern for cardiac ischemia. Patient pain managed with Toradol.  I discussed these findings with the patient as well as her need for continued followup with her primary care physician.  I have also discussed reasons to return immediately to the ER.  Patient expresses understanding and agrees with plan.   1. Medications: Naprosyn twice a day, Ultram for breakthrough pain 2. Treatment: Rest, drink plenty of fluids, gentle stretching exercises, use medication as prescribed 3. Follow Up: With primary care physician for further evaluation and treatment of your pain as well as evaluation of your elevated blood sugar.  Dahlia Client Jarry Manon, PA-C 12/19/11 1950

## 2011-12-19 NOTE — ED Provider Notes (Signed)
Medical screening examination/treatment/procedure(s) were performed by non-physician practitioner and as supervising physician I was immediately available for consultation/collaboration.   Dameion Briles, MD 12/19/11 2334 

## 2011-12-20 LAB — URINE CULTURE: Colony Count: 2000

## 2011-12-30 ENCOUNTER — Emergency Department (HOSPITAL_COMMUNITY): Payer: Medicaid Other

## 2011-12-30 ENCOUNTER — Encounter (HOSPITAL_COMMUNITY): Payer: Self-pay

## 2011-12-30 ENCOUNTER — Emergency Department (HOSPITAL_COMMUNITY)
Admission: EM | Admit: 2011-12-30 | Discharge: 2011-12-30 | Disposition: A | Discharge status: Home / Self Care | Payer: Medicaid Other | Attending: Emergency Medicine | Admitting: Emergency Medicine

## 2011-12-30 DIAGNOSIS — R071 Chest pain on breathing: Secondary | ICD-10-CM | POA: Insufficient documentation

## 2011-12-30 DIAGNOSIS — R079 Chest pain, unspecified: Secondary | ICD-10-CM | POA: Insufficient documentation

## 2011-12-30 DIAGNOSIS — I10 Essential (primary) hypertension: Secondary | ICD-10-CM | POA: Insufficient documentation

## 2011-12-30 DIAGNOSIS — R0789 Other chest pain: Secondary | ICD-10-CM

## 2011-12-30 LAB — BASIC METABOLIC PANEL
CO2: 21 mEq/L (ref 19–32)
Chloride: 99 mEq/L (ref 96–112)
Creatinine, Ser: 0.55 mg/dL (ref 0.50–1.10)
GFR calc Af Amer: >90 mL/min (ref >90)
Potassium: 5.3 mEq/L — ABNORMAL HIGH (ref 3.5–5.1)
Sodium: 135 mEq/L (ref 135–145)

## 2011-12-30 LAB — POCT I-STAT TROPONIN I: Troponin i, poc: 0 ng/mL (ref 0.00–0.08)

## 2011-12-30 LAB — CBC
Platelets: 402 10*3/uL — ABNORMAL HIGH (ref 150–400)
RBC: 4.17 MIL/uL (ref 3.87–5.11)
RDW: 14.3 % (ref 11.5–15.5)
WBC: 8.7 10*3/uL (ref 4.0–10.5)

## 2011-12-30 LAB — D-DIMER, QUANTITATIVE: D-Dimer, Quant: 0.51 ug/mL-FEU — ABNORMAL HIGH (ref 0.00–0.48)

## 2011-12-30 MED ORDER — INSULIN ASPART PROT & ASPART (70-30 MIX) 100 UNIT/ML ~~LOC~~ SUSP: 10.0000 [IU] | Freq: Once | SUBCUTANEOUS | Status: DC

## 2011-12-30 MED ORDER — IOHEXOL 350 MG/ML SOLN
100.0000 mL | Freq: Once | INTRAVENOUS | Status: AC | PRN
  Administered 2011-12-30: 100 mL via INTRAVENOUS

## 2011-12-30 MED ORDER — SODIUM CHLORIDE 0.9 % IV SOLN
Freq: Once | INTRAVENOUS | Status: AC
  Administered 2011-12-30: 12:00:00 via INTRAVENOUS

## 2011-12-30 MED ORDER — INSULIN ASPART 100 UNIT/ML ~~LOC~~ SOLN
10.0000 [IU] | Freq: Once | SUBCUTANEOUS | Status: AC
  Administered 2011-12-30: 10 [IU] via SUBCUTANEOUS
  Filled 2011-12-30: qty 1

## 2011-12-30 MED ORDER — DEXTROSE 50 % IV SOLN
1.0000 | Freq: Once | INTRAVENOUS | Status: AC
  Administered 2011-12-30: 50 mL via INTRAVENOUS
  Filled 2011-12-30: qty 50

## 2011-12-30 NOTE — ED Notes (Signed)
Pt moved to room 23, report given to Donnita Falls

## 2011-12-30 NOTE — ED Notes (Signed)
Describe her pain as heaviness, and sometimes the pain is worsen with respiration.

## 2011-12-30 NOTE — ED Notes (Signed)
Chest pain x 3 weeks and was seem by EDP 1.5 weeks for same and got diagnosed with upper respiratory infection. Reports SOB at times with exertion, no N/V/D. Pt alert, oriented, no diaphoresis noted, appear in no acute distress, respiration even unlabor.

## 2011-12-30 NOTE — ED Notes (Signed)
Pt to xray

## 2011-12-30 NOTE — ED Provider Notes (Signed)
History     CSN: [id number]  Arrival date & time 12/30/11  0750   First MD Initiated Contact with Patient 12/30/11 0756      Chief Complaint  Patient presents with  . Chest Pain    (Consider location/radiation/quality/duration/timing/severity/associated sxs/prior treatment) HPI Pt presents with c/o chest pain.  Pain is sharp, located to the right of midline.  No sob, no leg swelling.  No nausea/diaphoresis.  Pain is constant but worse when she moves and when she laughs.  She was seen by her PMD and in the ED for similar pain.  Has been taking naprosyn and tramadol as directed.  States that last night the pain was bothering her and keeping her from sleeping, so she came to the ED for evaluation this morning.  NO hx of DVT/PE, no recent trauma/travel or surgeries. There are no other associated systemic symptoms, there are no other alleviating or modifying factors.   Past Medical History  Diagnosis Date  . Hypertension   . Urinary tract infection   . Abscess   . Obesity     Past Surgical History  Procedure Date  . Cesarean section   . Tonsillectomy     Family History  Problem Relation Age of Onset  . Hypertension Mother   . Diabetes Mother   . Diabetes Other   . Hypertension Other     History  Substance Use Topics  . Smoking status: Never Smoker   . Smokeless tobacco: Not on file  . Alcohol Use: No    OB History    Grav Para Term Preterm Abortions TAB SAB Ect Mult Living                  Review of Systems ROS reviewed and all otherwise negative except for mentioned in HPI  Allergies  Review of patient's allergies indicates no known allergies.  Home Medications   Current Outpatient Rx  Name Route Sig Dispense Refill  . ACETAMINOPHEN 500 MG PO TABS Oral Take 1,000 mg by mouth every 6 (six) hours as needed. For pain.    . IBUPROFEN 600 MG PO TABS Oral Take 1 tablet (600 mg total) by mouth every 6 (six) hours as needed for pain. 30 tablet 0  . NAPROXEN 500  MG PO TABS Oral Take 1 tablet (500 mg total) by mouth 2 (two) times daily with a meal. 30 tablet 0  . TRAMADOL HCL 50 MG PO TABS Oral Take 1 tablet (50 mg total) by mouth every 6 (six) hours as needed for pain. 15 tablet 0  . HYDROCHLOROTHIAZIDE 25 MG PO TABS Oral Take 25 mg by mouth every other day.      BP 122/75  Pulse 98  Temp 98.3 F (36.8 C) (Oral)  Resp 22  SpO2 100%  LMP 12/29/2011 Vitals reviewed Physical Exam Physical Examination: General appearance - alert, well appearing, and in no distress Mental status - alert, oriented to person, place, and time Eyes -no conjunctival injection, no scleral icterus Mouth - mucous membranes moist, pharynx normal without lesions Chest - clear to auscultation, no wheezes, rales or rhonchi, symmetric air entry, some reproducible tenderness in mid chest.  Heart - normal rate, regular rhythm, normal S1, S2, no murmurs, rubs, clicks or gallops Abdomen - soft, nontender, nondistended, no masses or organomegaly Extremities - peripheral pulses normal, no pedal edema, no clubbing or cyanosis Skin - normal coloration and turgor, no rashes  ED Course  Procedures (including critical care time)   Date:  12/30/2011  Rate: 107  Rhythm: sinus tachycardia  QRS Axis: normal  Intervals: normal  ST/T Wave abnormalities: nonspecific t wave flattening  Conduction Disutrbances:none  Narrative Interpretation:   Old EKG Reviewed: unchanged from prior ekg of 12/19/11    Labs Reviewed  CBC - Abnormal; Notable for the following:    Platelets 402 (*)     All other components within normal limits  BASIC METABOLIC PANEL - Abnormal; Notable for the following:    Potassium 5.3 (*)     Glucose, Bld 168 (*)     All other components within normal limits  D-DIMER, QUANTITATIVE - Abnormal; Notable for the following:    D-Dimer, Quant 0.51 (*)     All other components within normal limits  POCT I-STAT TROPONIN I  LAB REPORT - SCANNED   No results  found.   1. Chest wall pain       MDM  Pt presenting with c/o continued chest wall pain.  She has been taking tramadol and naprosyn but is frustrated that pain is continuing.  Workup in the ED is reassuring.  D-dimer was mildly elevated, but CT angio did not show any PE.  Pt had some hyperkalemia- mild, but specimen was hemolyzed.  Discharged with strict return precautions.  Pt agreeable with plan.        Ethelda Chick, MD 01/01/12 [phone]

## 2011-12-30 NOTE — ED Notes (Signed)
Pt alert and oriented x4. Respirations even and unlabored, bilateral symmetrical rise and fall of chest. Skin warm and dry. In no acute distress. Denies needs.   

## 2012-02-04 ENCOUNTER — Encounter (HOSPITAL_COMMUNITY): Payer: Self-pay | Admitting: Adult Health

## 2012-02-04 ENCOUNTER — Emergency Department (HOSPITAL_COMMUNITY): Payer: Medicaid Other

## 2012-02-04 ENCOUNTER — Emergency Department (HOSPITAL_COMMUNITY)
Admission: EM | Admit: 2012-02-04 | Discharge: 2012-02-04 | Disposition: A | Discharge status: Home / Self Care | Payer: Medicaid Other | Attending: Emergency Medicine | Admitting: Emergency Medicine

## 2012-02-04 DIAGNOSIS — I10 Essential (primary) hypertension: Secondary | ICD-10-CM | POA: Insufficient documentation

## 2012-02-04 DIAGNOSIS — E669 Obesity, unspecified: Secondary | ICD-10-CM | POA: Insufficient documentation

## 2012-02-04 DIAGNOSIS — R109 Unspecified abdominal pain: Secondary | ICD-10-CM

## 2012-02-04 DIAGNOSIS — R1013 Epigastric pain: Secondary | ICD-10-CM | POA: Insufficient documentation

## 2012-02-04 DIAGNOSIS — R1110 Vomiting, unspecified: Secondary | ICD-10-CM

## 2012-02-04 DIAGNOSIS — R112 Nausea with vomiting, unspecified: Secondary | ICD-10-CM | POA: Insufficient documentation

## 2012-02-04 DIAGNOSIS — E860 Dehydration: Secondary | ICD-10-CM

## 2012-02-04 DIAGNOSIS — R197 Diarrhea, unspecified: Secondary | ICD-10-CM | POA: Insufficient documentation

## 2012-02-04 DIAGNOSIS — Z87440 Personal history of urinary (tract) infections: Secondary | ICD-10-CM | POA: Insufficient documentation

## 2012-02-04 DIAGNOSIS — Z79899 Other long term (current) drug therapy: Secondary | ICD-10-CM | POA: Insufficient documentation

## 2012-02-04 LAB — COMPREHENSIVE METABOLIC PANEL
ALT: 20 U/L (ref 0–35)
AST: 24 U/L (ref 0–37)
CO2: 26 mEq/L (ref 19–32)
Chloride: 99 mEq/L (ref 96–112)
Creatinine, Ser: 0.88 mg/dL (ref 0.50–1.10)
GFR calc non Af Amer: >90 mL/min (ref >90)
Total Bilirubin: 0.2 mg/dL — ABNORMAL LOW (ref 0.3–1.2)

## 2012-02-04 LAB — URINALYSIS, ROUTINE W REFLEX MICROSCOPIC
Bilirubin Urine: NEGATIVE
Specific Gravity, Urine: 1.029 (ref 1.005–1.030)
pH: 7 (ref 5.0–8.0)

## 2012-02-04 LAB — CBC WITH DIFFERENTIAL/PLATELET
Basophils Absolute: 0 10*3/uL (ref 0.0–0.1)
HCT: 36.8 % (ref 36.0–46.0)
Hemoglobin: 12.1 g/dL (ref 12.0–15.0)
Lymphocytes Relative: 28 % (ref 12–46)
Lymphs Abs: 3.2 10*3/uL (ref 0.7–4.0)
Monocytes Absolute: 0.6 10*3/uL (ref 0.1–1.0)
Neutro Abs: 7.7 10*3/uL (ref 1.7–7.7)
RBC: 4.12 MIL/uL (ref 3.87–5.11)
RDW: 13.4 % (ref 11.5–15.5)
WBC: 11.6 10*3/uL — ABNORMAL HIGH (ref 4.0–10.5)

## 2012-02-04 LAB — URINE MICROSCOPIC-ADD ON

## 2012-02-04 MED ORDER — METOCLOPRAMIDE HCL 10 MG PO TABS: 10.0000 mg | ORAL_TABLET | Freq: Four times a day (QID) | ORAL | Status: DC | PRN

## 2012-02-04 MED ORDER — GI COCKTAIL ~~LOC~~
30.0000 mL | Freq: Once | ORAL | Status: AC
  Administered 2012-02-04: 30 mL via ORAL
  Filled 2012-02-04: qty 30

## 2012-02-04 MED ORDER — ONDANSETRON HCL 4 MG/2ML IJ SOLN
4.0000 mg | Freq: Once | INTRAMUSCULAR | Status: AC
  Administered 2012-02-04: 4 mg via INTRAVENOUS
  Filled 2012-02-04: qty 2

## 2012-02-04 MED ORDER — MORPHINE SULFATE 4 MG/ML IJ SOLN
8.0000 mg | Freq: Once | INTRAMUSCULAR | Status: AC
  Administered 2012-02-04: 8 mg via INTRAVENOUS
  Filled 2012-02-04: qty 2

## 2012-02-04 NOTE — ED Provider Notes (Signed)
History     CSN: [id number]  Arrival date & time 02/04/12  1324   First MD Initiated Contact with Patient 02/04/12 0225      Chief Complaint  Patient presents with  . Abdominal Pain    (Consider location/radiation/quality/duration/timing/severity/associated sxs/prior treatment) The history is provided by the patient.  Vanessa Morton is a 26 y.o. female hx of HTN, obesity here with abdominal pain. She had generalized abdominal pain worse in the epigastric area. She had nausea and vomited 3 times and unable to tolerate PO. She also has couple episode of diarrhea. Her menstrual periods started this evening and she had some diffuse cramps. Denies any dysuria or fever. She also has chronic chest pain for the last 2 months. She was here a month ago and was ruled out for ACS and had normal CT angio chest. She was scheduled to follow up with a cardiologist in several days.    Past Medical History  Diagnosis Date  . Hypertension   . Urinary tract infection   . Abscess   . Obesity     Past Surgical History  Procedure Date  . Cesarean section   . Tonsillectomy     Family History  Problem Relation Age of Onset  . Hypertension Mother   . Diabetes Mother   . Diabetes Other   . Hypertension Other     History  Substance Use Topics  . Smoking status: Never Smoker   . Smokeless tobacco: Not on file  . Alcohol Use: No    OB History    Grav Para Term Preterm Abortions TAB SAB Ect Mult Living                  Review of Systems  Gastrointestinal: Positive for nausea, vomiting, abdominal pain and diarrhea.  All other systems reviewed and are negative.    Allergies  Review of patient's allergies indicates no known allergies.  Home Medications   Current Outpatient Rx  Name  Route  Sig  Dispense  Refill  . FUROSEMIDE 20 MG PO TABS   Oral   Take 20 mg by mouth 2 (two) times daily.         Marland Kitchen GLIPIZIDE 5 MG PO TABS   Oral   Take 5 mg by mouth daily.         .  IBUPROFEN 800 MG PO TABS   Oral   Take 800 mg by mouth every 6 (six) hours as needed. For pain         . LOSARTAN POTASSIUM 50 MG PO TABS   Oral   Take 50 mg by mouth daily.         Marland Kitchen METFORMIN HCL 500 MG PO TABS   Oral   Take 500 mg by mouth 2 (two) times daily with a meal.         . SIMVASTATIN 20 MG PO TABS   Oral   Take 20 mg by mouth every evening.         Marland Kitchen ZOLPIDEM TARTRATE ER 6.25 MG PO TBCR   Oral   Take 6.25 mg by mouth at bedtime as needed. For sleep         . METOCLOPRAMIDE HCL 10 MG PO TABS   Oral   Take 1 tablet (10 mg total) by mouth every 6 (six) hours as needed (nausea/headache).   10 tablet   0     BP 135/88  Pulse 109  Temp 98.1 F (36.7 C)  Resp 18  SpO2 100%  Physical Exam  Nursing note and vitals reviewed. Constitutional: She is oriented to person, place, and time. She appears well-developed and well-nourished.       Obese, uncomfortable   HENT:  Head: Normocephalic.  Mouth/Throat: Oropharynx is clear and moist.  Eyes: Conjunctivae normal are normal. Pupils are equal, round, and reactive to light.  Neck: Normal range of motion. Neck supple.  Cardiovascular: Normal rate, regular rhythm and normal heart sounds.   Pulmonary/Chest: Effort normal and breath sounds normal. No respiratory distress. She has no wheezes. She has no rales.  Abdominal: Soft. Bowel sounds are normal.       Obese, tender in epigastrium, possibly in RUQ as well. No lower abdominal tenderness.   Musculoskeletal: Normal range of motion.  Neurological: She is alert and oriented to person, place, and time.  Skin: Skin is warm and dry.  Psychiatric: She has a normal mood and affect. Her behavior is normal. Judgment and thought content normal.    ED Course  Procedures (including critical care time)  Labs Reviewed  CBC WITH DIFFERENTIAL - Abnormal; Notable for the following:    WBC 11.6 (*)     All other components within normal limits  COMPREHENSIVE METABOLIC  PANEL - Abnormal; Notable for the following:    Glucose, Bld 140 (*)     Total Bilirubin 0.2 (*)     All other components within normal limits  URINALYSIS, ROUTINE W REFLEX MICROSCOPIC - Abnormal; Notable for the following:    Hgb urine dipstick LARGE (*)     Ketones, ur 15 (*)     All other components within normal limits  URINE MICROSCOPIC-ADD ON - Abnormal; Notable for the following:    Squamous Epithelial / LPF FEW (*)     Bacteria, UA FEW (*)     All other components within normal limits  LIPASE, BLOOD  POCT I-STAT TROPONIN I  POCT PREGNANCY, URINE   US Abdomen Complete  02/04/2012  *RADIOLOGY REPORT*  Clinical Data:  Right upper quadrant abdominal pain  COMPLETE ABDOMINAL ULTRASOUND  Comparison:  None.  Findings:  Gallbladder:  No gallstones, gallbladder wall thickening, or pericholecystic fluid.  Common bile duct:  Poorly visualized , measures approximately 5.5 mm, upper normal limits.  Liver:  Increased echogenicity.  Focal lesion detection is limited in this setting.  IVC:  Inadequately visualized  Pancreas:  Poorly visualized/obscured by bowel gas artifact and patient body habitus.  Spleen:  Measures 5 cm oblique.  No focal abnormality.  Right Kidney:  Measures 10 cm.  No hydronephrosis or focal abnormality.  Left Kidney:  Measures 11 cm.  No hydronephrosis or focal abnormality.  Abdominal aorta:  Distal aorta obscured by bowel gas artifact.  No aneurysmal dilatation where seen.  IMPRESSION: Technically challenging examination secondary to patient body habitus.  No gallstones or sonographic evidence for cholecystitis.  Hepatic steatosis.  Pancreas not visualized.   Original Report Authenticated By: Jearld Lesch, M.D.      1. Vomiting   2. Dehydration   3. Abdominal pain     Date: 02/04/2012  Rate: 116  Rhythm: sinus tachycardia  QRS Axis: normal  Intervals: normal  ST/T Wave abnormalities: nonspecific ST changes  Conduction Disutrbances:none  Narrative Interpretation:     Old EKG Reviewed: none available      MDM  Vanessa Morton is a 26 y.o. female here with ab pain. She likely has reflux vs gastritis vs cholecystitis. Will get labs and do  [place] RUQ and reassess. Chest pain chronic, unlikely to be ACS.   6:46 AM Labs nl, lipase nl. [place] RUQ showed no gallstones. She tolerated PO and felt better. Likely has reflux or gastritis. Recommend reglan prn nausea and pepcid prn. Trop neg x 1 and chest pain has been there for 20 days. Recommend outpatient f/u with her cardiologist.         Richardean Canal, MD 02/04/12 [phone]

## 2012-02-04 NOTE — ED Notes (Signed)
Patient transported to Ultrasound 

## 2012-02-04 NOTE — ED Notes (Addendum)
Reports abdominal pain that began this evening associated with nausea and emesis x3. Pain is described a "hurt" and generalized to entire abdomen.  Nothing makes pain better and nothing makes pain worse. LMP: began this evening. Pt also reports one month of chest pain that she was seen and treated for and has a follow up with a cardiologist. Chest pain is described as heavy and intense. Gets better when lying down and resting and is worse with exertion or stress.

## 2012-02-12 ENCOUNTER — Other Ambulatory Visit (HOSPITAL_COMMUNITY): Payer: Self-pay | Admitting: *Deleted

## 2012-02-12 DIAGNOSIS — I517 Cardiomegaly: Secondary | ICD-10-CM

## 2012-02-20 ENCOUNTER — Ambulatory Visit (HOSPITAL_COMMUNITY)
Admission: RE | Admit: 2012-02-20 | Discharge: 2012-02-20 | Disposition: A | Discharge status: Home / Self Care | Payer: Medicaid Other | Source: Ambulatory Visit | Attending: Interventional Cardiology | Admitting: Interventional Cardiology

## 2012-02-20 DIAGNOSIS — I10 Essential (primary) hypertension: Secondary | ICD-10-CM | POA: Insufficient documentation

## 2012-02-20 DIAGNOSIS — I517 Cardiomegaly: Secondary | ICD-10-CM

## 2012-02-24 ENCOUNTER — Encounter (HOSPITAL_COMMUNITY): Payer: Self-pay | Admitting: Emergency Medicine

## 2012-02-24 ENCOUNTER — Emergency Department (HOSPITAL_COMMUNITY)
Admission: EM | Admit: 2012-02-24 | Discharge: 2012-02-24 | Disposition: A | Discharge status: Home / Self Care | Payer: Medicaid Other | Attending: Emergency Medicine | Admitting: Emergency Medicine

## 2012-02-24 DIAGNOSIS — Z791 Long term (current) use of non-steroidal anti-inflammatories (NSAID): Secondary | ICD-10-CM | POA: Insufficient documentation

## 2012-02-24 DIAGNOSIS — R0789 Other chest pain: Secondary | ICD-10-CM

## 2012-02-24 DIAGNOSIS — Z79899 Other long term (current) drug therapy: Secondary | ICD-10-CM | POA: Insufficient documentation

## 2012-02-24 DIAGNOSIS — E669 Obesity, unspecified: Secondary | ICD-10-CM | POA: Insufficient documentation

## 2012-02-24 DIAGNOSIS — Z87440 Personal history of urinary (tract) infections: Secondary | ICD-10-CM | POA: Insufficient documentation

## 2012-02-24 DIAGNOSIS — I10 Essential (primary) hypertension: Secondary | ICD-10-CM | POA: Insufficient documentation

## 2012-02-24 DIAGNOSIS — Z872 Personal history of diseases of the skin and subcutaneous tissue: Secondary | ICD-10-CM | POA: Insufficient documentation

## 2012-02-24 DIAGNOSIS — E119 Type 2 diabetes mellitus without complications: Secondary | ICD-10-CM | POA: Insufficient documentation

## 2012-02-24 DIAGNOSIS — R05 Cough: Secondary | ICD-10-CM | POA: Insufficient documentation

## 2012-02-24 DIAGNOSIS — R071 Chest pain on breathing: Secondary | ICD-10-CM | POA: Insufficient documentation

## 2012-02-24 DIAGNOSIS — R059 Cough, unspecified: Secondary | ICD-10-CM | POA: Insufficient documentation

## 2012-02-24 HISTORY — DX: Type 2 diabetes mellitus without complications: E11.9

## 2012-02-24 MED ORDER — PREDNISONE 20 MG PO TABS: 40.0000 mg | ORAL_TABLET | Freq: Once | ORAL | Status: DC

## 2012-02-24 MED ORDER — PREDNISONE 20 MG PO TABS
40.0000 mg | ORAL_TABLET | Freq: Once | ORAL | Status: AC
  Administered 2012-02-24: 40 mg via ORAL
  Filled 2012-02-24: qty 2

## 2012-02-24 MED ORDER — OXYCODONE-ACETAMINOPHEN 5-325 MG PO TABS: 1.0000 | ORAL_TABLET | Freq: Four times a day (QID) | ORAL | Status: DC | PRN

## 2012-02-24 NOTE — ED Notes (Signed)
Pt c/o midsternal CP with SOB and heaviness x weeks; pt seen here for same and followed up with cards; pt sts pain worse this am

## 2012-02-24 NOTE — ED Provider Notes (Signed)
History     CSN: [id number]  Arrival date & time 02/24/12  4098   First MD Initiated Contact with Patient 02/24/12 0801      Chief Complaint  Patient presents with  . Chest Pain    (Consider location/radiation/quality/duration/timing/severity/associated sxs/prior treatment) Patient is a 26 y.o. female presenting with chest pain. The history is provided by the patient.  Chest Pain Primary symptoms include cough. Pertinent negatives for primary symptoms include no shortness of breath, no abdominal pain, no nausea and no vomiting.  Pertinent negatives for associated symptoms include no numbness and no weakness.    patient has had chest pain over the last few months. Is seen in the ER a few times for it and has been seen by her primary care doctor and cardiology. She was initially told was bronchitis since been told of this pain in her chest wall. The wrist. She has had a cough. No relief with her Motrin or Nexium. No fevers. She's had some mild shortness of breath. She has had a CT angiogram and numerous EKGs. She follow with cardiology and had an echocardiogram done. No change in her urinary symptoms. No lightheadedness or dizziness. No trauma. Patient states she was told it would last up to 8 weeks and has been 8 weeks and still continuing.  Past Medical History  Diagnosis Date  . Hypertension   . Urinary tract infection   . Abscess   . Obesity   . Diabetes mellitus without complication     Past Surgical History  Procedure Date  . Cesarean section   . Tonsillectomy     Family History  Problem Relation Age of Onset  . Hypertension Mother   . Diabetes Mother   . Diabetes Other   . Hypertension Other     History  Substance Use Topics  . Smoking status: Never Smoker   . Smokeless tobacco: Not on file  . Alcohol Use: No    OB History    Grav Para Term Preterm Abortions TAB SAB Ect Mult Living                  Review of Systems  Constitutional: Negative for  activity change and appetite change.  HENT: Negative for neck stiffness.   Eyes: Negative for pain.  Respiratory: Positive for cough. Negative for chest tightness and shortness of breath.   Cardiovascular: Positive for chest pain. Negative for leg swelling.  Gastrointestinal: Negative for nausea, vomiting, abdominal pain and diarrhea.  Genitourinary: Negative for flank pain.  Musculoskeletal: Negative for back pain.  Skin: Negative for rash.  Neurological: Negative for weakness, numbness and headaches.  Psychiatric/Behavioral: Negative for behavioral problems.    Allergies  Review of patient's allergies indicates no known allergies.  Home Medications   Current Outpatient Rx  Name  Route  Sig  Dispense  Refill  . FUROSEMIDE 20 MG PO TABS   Oral   Take 20 mg by mouth 2 (two) times daily.         Marland Kitchen GLIPIZIDE 5 MG PO TABS   Oral   Take 5 mg by mouth daily.         . IBUPROFEN 800 MG PO TABS   Oral   Take 800 mg by mouth every 6 (six) hours as needed. For pain         . LOSARTAN POTASSIUM 50 MG PO TABS   Oral   Take 50 mg by mouth daily.         Marland Kitchen  METFORMIN HCL 500 MG PO TABS   Oral   Take 500 mg by mouth 2 (two) times daily with a meal.         . SIMVASTATIN 20 MG PO TABS   Oral   Take 20 mg by mouth every evening.         Marland Kitchen ZOLPIDEM TARTRATE ER 6.25 MG PO TBCR   Oral   Take 6.25 mg by mouth at bedtime as needed. For sleep         . OXYCODONE-ACETAMINOPHEN 5-325 MG PO TABS   Oral   Take 1-2 tablets by mouth every 6 (six) hours as needed for pain.   15 tablet   0   . PREDNISONE 20 MG PO TABS   Oral   Take 2 tablets (40 mg total) by mouth once.   2 tablet   0     BP 152/89  Pulse 106  Temp 98.3 F (36.8 C) (Oral)  Resp 20  SpO2 100%  Physical Exam  Nursing note and vitals reviewed. Constitutional: She is oriented to person, place, and time. She appears well-developed and well-nourished.       Patient is obese  HENT:  Head:  Normocephalic and atraumatic.  Eyes: EOM are normal. Pupils are equal, round, and reactive to light.  Neck: Normal range of motion. Neck supple.  Cardiovascular: Normal rate, regular rhythm and normal heart sounds.   No murmur heard. Pulmonary/Chest: Effort normal and breath sounds normal. No respiratory distress. She has no wheezes. She has no rales. She exhibits tenderness.       Tenderness anterior chest wall, worse on the right. Minimal tachycardia  Abdominal: Soft. Bowel sounds are normal. She exhibits no distension. There is no tenderness. There is no rebound and no guarding.  Musculoskeletal: Normal range of motion.  Neurological: She is alert and oriented to person, place, and time. No cranial nerve deficit.  Skin: Skin is warm and dry.  Psychiatric: She has a normal mood and affect. Her speech is normal.    ED Course  Procedures (including critical care time)  Labs Reviewed - No data to display No results found.   1. Chest wall pain     Date: 02/24/2012  Rate: 108  Rhythm: normal sinus rhythm  QRS Axis: normal  Intervals: normal  ST/T Wave abnormalities: normal  Conduction Disutrbances:none  Narrative Interpretation: Q waves inferiorly  Old EKG Reviewed: unchanged     MDM  Patient with chest pain. Multiple prior evaluations for same, including cardiology. Patient had an echocardiogram done which showed only grade 1 diastolic dysfunction. She's taken NSAIDs without relief. He'll try a very brief course of oral steroids. She'll begin 40 mg today and 40 mg tomorrow. She'll need to watch her sugars closely. She's also given some Percocet. At this time I do not think she needs further workup for it and she'll need followup with her primary care Dr.        Juliet Rude. Rubin Payor, MD 02/24/12 [phone]

## 2012-02-28 ENCOUNTER — Emergency Department (HOSPITAL_COMMUNITY): Payer: Medicaid Other

## 2012-02-28 ENCOUNTER — Emergency Department (HOSPITAL_COMMUNITY)
Admission: EM | Admit: 2012-02-28 | Discharge: 2012-02-28 | Discharge status: Against Medical Advice | Payer: Medicaid Other | Attending: Emergency Medicine | Admitting: Emergency Medicine

## 2012-02-28 ENCOUNTER — Encounter (HOSPITAL_COMMUNITY): Payer: Self-pay | Admitting: Emergency Medicine

## 2012-02-28 DIAGNOSIS — R072 Precordial pain: Secondary | ICD-10-CM | POA: Insufficient documentation

## 2012-02-28 LAB — BASIC METABOLIC PANEL
GFR calc non Af Amer: >90 mL/min (ref >90)
Glucose, Bld: 168 mg/dL — ABNORMAL HIGH (ref 70–99)
Potassium: 3.5 mEq/L (ref 3.5–5.1)
Sodium: 138 mEq/L (ref 135–145)

## 2012-02-28 LAB — POCT I-STAT TROPONIN I: Troponin i, poc: 0 ng/mL (ref 0.00–0.08)

## 2012-02-28 NOTE — ED Notes (Signed)
Called pt to be moved to A05 but did not answer.

## 2012-02-28 NOTE — ED Notes (Signed)
Called for pt in waiting room with no answer. 

## 2012-02-28 NOTE — ED Notes (Signed)
Pt called in waiting area with no response.

## 2012-02-28 NOTE — ED Notes (Signed)
Pt c/o mid sternal CP that is same as seen for in past recently; pt sts no improvement; pt sts steroids made blood sugar go up so could not take

## 2012-04-02 ENCOUNTER — Emergency Department (HOSPITAL_COMMUNITY)
Admission: EM | Admit: 2012-04-02 | Discharge: 2012-04-02 | Disposition: A | Discharge status: Home / Self Care | Payer: Medicaid Other | Attending: Emergency Medicine | Admitting: Emergency Medicine

## 2012-04-02 ENCOUNTER — Encounter (HOSPITAL_COMMUNITY): Payer: Self-pay | Admitting: *Deleted

## 2012-04-02 DIAGNOSIS — Z87440 Personal history of urinary (tract) infections: Secondary | ICD-10-CM | POA: Insufficient documentation

## 2012-04-02 DIAGNOSIS — K089 Disorder of teeth and supporting structures, unspecified: Secondary | ICD-10-CM | POA: Insufficient documentation

## 2012-04-02 DIAGNOSIS — K0889 Other specified disorders of teeth and supporting structures: Secondary | ICD-10-CM

## 2012-04-02 DIAGNOSIS — R220 Localized swelling, mass and lump, head: Secondary | ICD-10-CM | POA: Insufficient documentation

## 2012-04-02 DIAGNOSIS — E669 Obesity, unspecified: Secondary | ICD-10-CM | POA: Insufficient documentation

## 2012-04-02 DIAGNOSIS — E119 Type 2 diabetes mellitus without complications: Secondary | ICD-10-CM | POA: Insufficient documentation

## 2012-04-02 DIAGNOSIS — Z79899 Other long term (current) drug therapy: Secondary | ICD-10-CM | POA: Insufficient documentation

## 2012-04-02 DIAGNOSIS — Z872 Personal history of diseases of the skin and subcutaneous tissue: Secondary | ICD-10-CM | POA: Insufficient documentation

## 2012-04-02 DIAGNOSIS — I10 Essential (primary) hypertension: Secondary | ICD-10-CM | POA: Insufficient documentation

## 2012-04-02 HISTORY — DX: Chest pain, unspecified: R07.9

## 2012-04-02 MED ORDER — AMOXICILLIN 500 MG PO CAPS: 500.0000 mg | ORAL_CAPSULE | Freq: Three times a day (TID) | ORAL | Status: DC

## 2012-04-02 MED ORDER — HYDROCODONE-ACETAMINOPHEN 5-325 MG PO TABS: 2.0000 | ORAL_TABLET | ORAL | Status: DC | PRN

## 2012-04-02 NOTE — ED Notes (Signed)
Patient is resting comfortably. 

## 2012-04-02 NOTE — ED Notes (Signed)
Patient has had tooth and gum problems since last Summer but she got better with antibiotics.  Patient said the pain returned this weekend and she was trying to treat it with gargling, oragel and tramadol but it is not working.  Patient says she came in because she cannot stand the pain anymore.

## 2012-04-02 NOTE — ED Provider Notes (Signed)
History  This chart was scribed for non-physician practitioner working with Vanessa Sprout, MD by Ardeen Jourdain, ED Scribe. This patient was seen in room TR06C/TR06C and the patient's care was started at 1733.  CSN: [id number]  Arrival date & time 04/02/12  1625   First MD Initiated Contact with Patient 04/02/12 1733      Chief Complaint  Patient presents with  . Dental Pain     Patient is a 27 y.o. female presenting with tooth pain. The history is provided by the patient. No language interpreter was used.  Dental PainThe primary symptoms include angioedema. Primary symptoms do not include mouth pain, dental injury, oral bleeding, headaches, fever, shortness of breath, sore throat or cough. The symptoms began more than 1 week ago. The symptoms are worsening. The symptoms are new. The symptoms occur constantly.  The angioedema began yesterday. The angioedema has been unchanged since its onset. It is a new problem. It is located on the face. The angioedema is not associated with shortness of breath or stridor.  Additional symptoms include: facial swelling. Additional symptoms do not include: dental sensitivity to temperature, gum swelling, gum tenderness, purulent gums, trismus, jaw pain, trouble swallowing, pain with swallowing, excessive salivation, dry mouth, taste disturbance, smell disturbance, drooling, ear pain, hearing loss, nosebleeds, swollen glands, goiter and fatigue.   Vanessa Morton is a 27 y.o. female who presents to the Emergency Department complaining of right sided dental pain. She reports she noticed swelling to the right side of her face last night. She states she had similar pain 7 months ago which was treated with antibiotics.  Past Medical History  Diagnosis Date  . Hypertension   . Urinary tract infection   . Abscess   . Obesity   . Diabetes mellitus without complication   . Chest pain     Past Surgical History  Procedure Date  . Cesarean section   .  Tonsillectomy     Family History  Problem Relation Age of Onset  . Hypertension Mother   . Diabetes Mother   . Diabetes Other   . Hypertension Other     History  Substance Use Topics  . Smoking status: Never Smoker   . Smokeless tobacco: Not on file  . Alcohol Use: No   No OB history available.   Review of Systems  Constitutional: Negative for fever and fatigue.  HENT: Positive for facial swelling. Negative for hearing loss, ear pain, nosebleeds, sore throat, drooling and trouble swallowing.   Respiratory: Negative for cough, shortness of breath and stridor.   Neurological: Negative for headaches.  All other systems reviewed and are negative.    Allergies  Review of patient's allergies indicates no known allergies.  Home Medications   Current Outpatient Rx  Name  Route  Sig  Dispense  Refill  . CARVEDILOL 3.125 MG PO TABS   Oral   Take 3.125 mg by mouth 2 (two) times daily with a meal.         . FUROSEMIDE 20 MG PO TABS   Oral   Take 20 mg by mouth 2 (two) times daily.         Marland Kitchen GLIPIZIDE 5 MG PO TABS   Oral   Take 5 mg by mouth daily.         . IBUPROFEN 800 MG PO TABS   Oral   Take 800 mg by mouth every 6 (six) hours as needed. For pain         .  LOSARTAN POTASSIUM 50 MG PO TABS   Oral   Take 50 mg by mouth daily.         Marland Kitchen METFORMIN HCL 500 MG PO TABS   Oral   Take 500 mg by mouth 2 (two) times daily with a meal.         . SIMVASTATIN 20 MG PO TABS   Oral   Take 20 mg by mouth every evening.         Marland Kitchen TRAMADOL HCL 50 MG PO TABS   Oral   Take 50 mg by mouth 2 (two) times daily as needed. For pain         . ZOLPIDEM TARTRATE ER 6.25 MG PO TBCR   Oral   Take 6.25 mg by mouth at bedtime as needed. For sleep           Triage Vitals: BP 146/99  Pulse 111  Temp 98.8 F (37.1 C) (Oral)  Resp 18  SpO2 98%  LMP 03/02/2012  Physical Exam  Nursing note and vitals reviewed. Constitutional: She is oriented to person, place, and  time. She appears well-developed and well-nourished. No distress.  HENT:  Head: Normocephalic and atraumatic. No trismus in the jaw.  Right Ear: External ear normal.  Left Ear: External ear normal.  Mouth/Throat: Uvula is midline and oropharynx is clear and moist. Mucous membranes are not pale, not dry and not cyanotic. She does not have dentures. No oral lesions. Normal dentition. Dental caries present. No uvula swelling or lacerations. No oropharyngeal exudate, posterior oropharyngeal edema, posterior oropharyngeal erythema or tonsillar abscesses.    Eyes: EOM are normal. Pupils are equal, round, and reactive to light.  Neck: Normal range of motion. Neck supple. No JVD present. No tracheal deviation present. No thyromegaly present.  Cardiovascular: Normal rate, regular rhythm and normal heart sounds.   Pulmonary/Chest: Effort normal and breath sounds normal. No stridor. No respiratory distress.  Abdominal: Soft. Bowel sounds are normal. She exhibits no distension and no mass. There is no tenderness. There is no rebound and no guarding.  Musculoskeletal: Normal range of motion. She exhibits no edema.  Lymphadenopathy:    She has no cervical adenopathy.  Neurological: She is alert and oriented to person, place, and time.  Skin: Skin is warm and dry. No rash noted. No erythema.       Facial swelling to the right face  Psychiatric: She has a normal mood and affect. Her behavior is normal.    ED Course  Procedures (including critical care time)  DIAGNOSTIC STUDIES: Oxygen Saturation is 98% on room air, normal by my interpretation.    COORDINATION OF CARE:  5:39 PM: Discussed treatment plan which includes antibiotics with pt at bedside and pt agreed to plan.     Labs Reviewed - No data to display No results found.   No diagnosis found.  Dental pain with mild swelling of left side of face.  Antibiotics, analgesics, dental follow-up.  MDM    I personally performed the services  described in this documentation, which was scribed in my presence. The recorded information has been reviewed and is accurate.       Jimmye Norman, NP 04/02/12 [phone]

## 2012-04-02 NOTE — ED Notes (Signed)
Pt with pain to R side of mouth x 1 week.  Unable to say exactly which tooth is hurting her.  States last night L cheek began to swell.  No obvious swelling noted.

## 2012-04-02 NOTE — ED Notes (Signed)
Patient is alert and orientedx4. Patient was explained discharge instructions and she understood them.  Patient had no questions. 

## 2012-04-03 NOTE — ED Provider Notes (Signed)
Medical screening examination/treatment/procedure(s) were performed by non-physician practitioner and as supervising physician I was immediately available for consultation/collaboration.   [name], MD 04/03/12 [phone]

## 2012-06-25 ENCOUNTER — Encounter (HOSPITAL_COMMUNITY): Payer: Self-pay | Admitting: Emergency Medicine

## 2012-06-25 ENCOUNTER — Emergency Department (HOSPITAL_COMMUNITY): Payer: Medicaid Other

## 2012-06-25 ENCOUNTER — Emergency Department (HOSPITAL_COMMUNITY)
Admission: EM | Admit: 2012-06-25 | Discharge: 2012-06-25 | Disposition: A | Discharge status: Home / Self Care | Payer: Medicaid Other | Attending: Emergency Medicine | Admitting: Emergency Medicine

## 2012-06-25 DIAGNOSIS — R071 Chest pain on breathing: Secondary | ICD-10-CM | POA: Insufficient documentation

## 2012-06-25 DIAGNOSIS — E119 Type 2 diabetes mellitus without complications: Secondary | ICD-10-CM | POA: Insufficient documentation

## 2012-06-25 DIAGNOSIS — I10 Essential (primary) hypertension: Secondary | ICD-10-CM | POA: Insufficient documentation

## 2012-06-25 DIAGNOSIS — E669 Obesity, unspecified: Secondary | ICD-10-CM | POA: Insufficient documentation

## 2012-06-25 DIAGNOSIS — Z79899 Other long term (current) drug therapy: Secondary | ICD-10-CM | POA: Insufficient documentation

## 2012-06-25 DIAGNOSIS — Z872 Personal history of diseases of the skin and subcutaneous tissue: Secondary | ICD-10-CM | POA: Insufficient documentation

## 2012-06-25 DIAGNOSIS — Z87440 Personal history of urinary (tract) infections: Secondary | ICD-10-CM | POA: Insufficient documentation

## 2012-06-25 DIAGNOSIS — R0789 Other chest pain: Secondary | ICD-10-CM

## 2012-06-25 LAB — TROPONIN I: Troponin I: <0.3 ng/mL (ref <0.30)

## 2012-06-25 LAB — CBC WITH DIFFERENTIAL/PLATELET
Basophils Absolute: 0 10*3/uL (ref 0.0–0.1)
HCT: 35.8 % — ABNORMAL LOW (ref 36.0–46.0)
Lymphocytes Relative: 34 % (ref 12–46)
Monocytes Absolute: 0.5 10*3/uL (ref 0.1–1.0)
Neutro Abs: 5.5 10*3/uL (ref 1.7–7.7)
Neutrophils Relative %: 59 % (ref 43–77)
Platelets: 398 10*3/uL (ref 150–400)
RDW: 13.6 % (ref 11.5–15.5)
WBC: 9.2 10*3/uL (ref 4.0–10.5)

## 2012-06-25 LAB — BASIC METABOLIC PANEL
CO2: 26 mEq/L (ref 19–32)
Chloride: 101 mEq/L (ref 96–112)
GFR calc Af Amer: >90 mL/min (ref >90)
Potassium: 3.9 mEq/L (ref 3.5–5.1)
Sodium: 138 mEq/L (ref 135–145)

## 2012-06-25 LAB — PRO B NATRIURETIC PEPTIDE: Pro B Natriuretic peptide (BNP): 21.3 pg/mL (ref 0–125)

## 2012-06-25 LAB — D-DIMER, QUANTITATIVE: D-Dimer, Quant: <0.27 ug/mL-FEU (ref 0.00–0.48)

## 2012-06-25 MED ORDER — HYDROCODONE-ACETAMINOPHEN 5-325 MG PO TABS: 2.0000 | ORAL_TABLET | ORAL | Status: DC | PRN

## 2012-06-25 MED ORDER — TRAMADOL HCL 50 MG PO TABS: 50.0000 mg | ORAL_TABLET | Freq: Four times a day (QID) | ORAL | Status: DC | PRN

## 2012-06-25 NOTE — ED Notes (Signed)
Pt c/o of centralized chest pain that started x4 months ago, but has gotten intense over the past few days. Also c/o of SOB upon exertion. Chest painful to touch when applying pressure.

## 2012-06-25 NOTE — ED Provider Notes (Signed)
History     CSN: [id number]  Arrival date & time 06/25/12  1747   First MD Initiated Contact with Patient 06/25/12 1757      Chief Complaint  Patient presents with  . Chest Pain    (Consider location/radiation/quality/duration/timing/severity/associated sxs/prior treatment) HPI Comments: Patient presents to the ER for evaluation of chest pain. Patient reports that she's been having intermittent pain in the center of her chest for four months. She says it comes on and lasts for several days or longer, then tends to go away on its own. What is present is exacerbated by movements, bending and twisting. In the last couple of days, however, the pain has intensified. She reports that she has had cough and chest congestion associated with pain. She has not had a fever. Patient is not experiencing shortness of breath.   Patient is a 27 y.o. female presenting with chest pain.  Chest Pain   Past Medical History  Diagnosis Date  . Hypertension   . Urinary tract infection   . Abscess   . Obesity   . Diabetes mellitus without complication   . Chest pain     Past Surgical History  Procedure Laterality Date  . Cesarean section    . Tonsillectomy      Family History  Problem Relation Age of Onset  . Hypertension Mother   . Diabetes Mother   . Diabetes Other   . Hypertension Other     History  Substance Use Topics  . Smoking status: Never Smoker   . Smokeless tobacco: Not on file  . Alcohol Use: No    OB History   Grav Para Term Preterm Abortions TAB SAB Ect Mult Living                  Review of Systems  Cardiovascular: Positive for chest pain.  All other systems reviewed and are negative.    Allergies  Review of patient's allergies indicates no known allergies.  Home Medications   Current Outpatient Rx  Name  Route  Sig  Dispense  Refill  . amoxicillin (AMOXIL) 500 MG capsule   Oral   Take 1 capsule (500 mg total) by mouth 3 (three) times daily.   21  capsule   0   . carvedilol (COREG) 3.125 MG tablet   Oral   Take 3.125 mg by mouth 2 (two) times daily with a meal.         . furosemide (LASIX) 20 MG tablet   Oral   Take 20 mg by mouth 2 (two) times daily.         Marland Kitchen glipiZIDE (GLUCOTROL) 5 MG tablet   Oral   Take 5 mg by mouth daily.         Marland Kitchen HYDROcodone-acetaminophen (NORCO/VICODIN) 5-325 MG per tablet   Oral   Take 2 tablets by mouth every 4 (four) hours as needed for pain.   10 tablet   0   . ibuprofen (ADVIL,MOTRIN) 800 MG tablet   Oral   Take 800 mg by mouth every 6 (six) hours as needed. For pain         . losartan (COZAAR) 50 MG tablet   Oral   Take 50 mg by mouth daily.         . metFORMIN (GLUCOPHAGE) 500 MG tablet   Oral   Take 500 mg by mouth 2 (two) times daily with a meal.         . simvastatin (ZOCOR)  20 MG tablet   Oral   Take 20 mg by mouth every evening.         . traMADol (ULTRAM) 50 MG tablet   Oral   Take 50 mg by mouth 2 (two) times daily as needed. For pain         . zolpidem (AMBIEN CR) 6.25 MG CR tablet   Oral   Take 6.25 mg by mouth at bedtime as needed. For sleep           BP 143/86  Pulse 87  Temp(Src) 98.7 F (37.1 C) (Oral)  Resp 18  SpO2 100%  Physical Exam  Constitutional: She is oriented to person, place, and time. She appears well-developed and well-nourished. No distress.  HENT:  Head: Normocephalic and atraumatic.  Right Ear: Hearing normal.  Nose: Nose normal.  Mouth/Throat: Oropharynx is clear and moist and mucous membranes are normal.  Eyes: Conjunctivae and EOM are normal. Pupils are equal, round, and reactive to light.  Neck: Normal range of motion. Neck supple.  Cardiovascular: Normal rate, regular rhythm, S1 normal and S2 normal.  Exam reveals no gallop and no friction rub.   No murmur heard. Pulmonary/Chest: Effort normal and breath sounds normal. No respiratory distress. She exhibits tenderness.    Abdominal: Soft. Normal appearance  and bowel sounds are normal. There is no hepatosplenomegaly. There is no tenderness. There is no rebound, no guarding, no tenderness at McBurney's point and negative Murphy's sign. No hernia.  Musculoskeletal: Normal range of motion.  Neurological: She is alert and oriented to person, place, and time. She has normal strength. No cranial nerve deficit or sensory deficit. Coordination normal. GCS eye subscore is 4. GCS verbal subscore is 5. GCS motor subscore is 6.  Skin: Skin is warm, dry and intact. No rash noted. No cyanosis.  Psychiatric: She has a normal mood and affect. Her speech is normal and behavior is normal. Thought content normal.    ED Course  Procedures (including critical care time)  EKG:  Date: 06/25/2012  Rate: 96  Rhythm: normal sinus rhythm  QRS Axis: normal  Intervals: normal  ST/T Wave abnormalities: t inversions anterior leads  Conduction Disutrbances:none  Narrative Interpretation:   Old EKG Reviewed: unchanged and t wave inversions are old    Labs Reviewed  CBC WITH DIFFERENTIAL - Abnormal; Notable for the following:    Hemoglobin 11.5 (*)    HCT 35.8 (*)    All other components within normal limits  BASIC METABOLIC PANEL - Abnormal; Notable for the following:    Glucose, Bld 116 (*)    All other components within normal limits  TROPONIN I  D-DIMER, QUANTITATIVE  PRO B NATRIURETIC PEPTIDE   Dg Chest 2 View  06/25/2012  *RADIOLOGY REPORT*  Clinical Data: Chest pain  CHEST - 2 VIEW  Comparison: 02/28/2012  Findings: Lung volumes.  No focal consolidation.  No pleural effusion or pneumothorax.  Mild cardiomegaly.  Mild degenerative changes of the visualized thoracolumbar spine.  IMPRESSION: No evidence of acute cardiopulmonary disease.   Original Report Authenticated By: Charline Bills, M.D.      Diagnosis: Chest pain    MDM  Patient comes to the ER for evaluation of chest pain. Symptoms have been going on for about 4 months. She has pain at last 4  days or longer at a time. The last 3 days she has had persistent pain that is worse with touching the area as well as bending and movements. She does have  anterior chest wall tenderness here in the ER. EKG shows anterior T-wave inversions, but this was present on previous EKGs. No acute changes. Patient does have a very slightly reduced ejection fraction has seen on echo in the past, but no current signs of congestive heart failure. Symptoms are very atypical in nature. She has had 3 days of pain without any elevation of troponin or changes on EKG. Patient has a cardiologist, Dr. Eldridge Dace. She'll be referred back to Dr. Eldridge Dace to be seen in the office consider further evaluation, possible stress test. Return to the ER if her symptoms worsen.        Gilda Crease, MD 06/25/12 2012

## 2012-06-30 ENCOUNTER — Encounter (HOSPITAL_COMMUNITY): Payer: Self-pay | Admitting: *Deleted

## 2012-06-30 ENCOUNTER — Emergency Department (HOSPITAL_COMMUNITY)
Admission: EM | Admit: 2012-06-30 | Discharge: 2012-06-30 | Disposition: A | Discharge status: Home / Self Care | Payer: Medicaid Other | Attending: Emergency Medicine | Admitting: Emergency Medicine

## 2012-06-30 DIAGNOSIS — E119 Type 2 diabetes mellitus without complications: Secondary | ICD-10-CM | POA: Insufficient documentation

## 2012-06-30 DIAGNOSIS — I10 Essential (primary) hypertension: Secondary | ICD-10-CM | POA: Insufficient documentation

## 2012-06-30 DIAGNOSIS — Z79899 Other long term (current) drug therapy: Secondary | ICD-10-CM | POA: Insufficient documentation

## 2012-06-30 DIAGNOSIS — Z87440 Personal history of urinary (tract) infections: Secondary | ICD-10-CM | POA: Insufficient documentation

## 2012-06-30 DIAGNOSIS — L0211 Cutaneous abscess of neck: Secondary | ICD-10-CM | POA: Insufficient documentation

## 2012-06-30 DIAGNOSIS — L0291 Cutaneous abscess, unspecified: Secondary | ICD-10-CM

## 2012-06-30 DIAGNOSIS — E669 Obesity, unspecified: Secondary | ICD-10-CM | POA: Insufficient documentation

## 2012-06-30 MED ORDER — IBUPROFEN 400 MG PO TABS
800.0000 mg | ORAL_TABLET | Freq: Once | ORAL | Status: AC
  Administered 2012-06-30: 800 mg via ORAL
  Filled 2012-06-30: qty 2

## 2012-06-30 MED ORDER — CEPHALEXIN 500 MG PO CAPS: 500.0000 mg | ORAL_CAPSULE | Freq: Four times a day (QID) | ORAL | Status: DC

## 2012-06-30 NOTE — ED Notes (Signed)
PT to ED c/o recurrent abscess to post neck.  Has been tx here for same problem multiple times (last was in Dec).  She was told that if problem persisted, she could return and be referred to a dermatologist.

## 2012-07-01 NOTE — ED Provider Notes (Signed)
History     CSN: [id number]  Arrival date & time 06/30/12  1543   First MD Initiated Contact with Patient 06/30/12 1725      Chief Complaint  Patient presents with  . Abscess    (Consider location/radiation/quality/duration/timing/severity/associated sxs/prior treatment) HPI  Patient is a 27 yo F with history for recurrent neck abscesses presenting to ED with abscess on neck she noticed a week ago. Pt states it is painful w/o drainage. Last recurrence was in December. Denies other abscesses or skin problems.Patient denies fevers, chills, nausea, vomiting, or diarrhea.    Past Medical History  Diagnosis Date  . Hypertension   . Urinary tract infection   . Abscess   . Obesity   . Diabetes mellitus without complication   . Chest pain     Past Surgical History  Procedure Laterality Date  . Cesarean section    . Tonsillectomy      Family History  Problem Relation Age of Onset  . Hypertension Mother   . Diabetes Mother   . Diabetes Other   . Hypertension Other     History  Substance Use Topics  . Smoking status: Never Smoker   . Smokeless tobacco: Not on file  . Alcohol Use: No    OB History   Grav Para Term Preterm Abortions TAB SAB Ect Mult Living                  Review of Systems  Skin:       + abscess  All other systems reviewed and are negative.    Allergies  Review of patient's allergies indicates no known allergies.  Home Medications   Current Outpatient Rx  Name  Route  Sig  Dispense  Refill  . acetaminophen (TYLENOL) 500 MG tablet   Oral   Take 500 mg by mouth every 6 (six) hours as needed for pain.         Marland Kitchen HYDROcodone-acetaminophen (NORCO/VICODIN) 5-325 MG per tablet   Oral   Take 2 tablets by mouth every 4 (four) hours as needed for pain.         Marland Kitchen losartan (COZAAR) 50 MG tablet   Oral   Take 50 mg by mouth daily.         . metFORMIN (GLUCOPHAGE) 500 MG tablet   Oral   Take 500 mg by mouth 2 (two) times daily with a  meal.         . traMADol (ULTRAM) 50 MG tablet   Oral   Take 50 mg by mouth every 6 (six) hours as needed for pain.         Marland Kitchen zolpidem (AMBIEN CR) 6.25 MG CR tablet   Oral   Take 6.25 mg by mouth at bedtime as needed. For sleep         . cephALEXin (KEFLEX) 500 MG capsule   Oral   Take 1 capsule (500 mg total) by mouth 4 (four) times daily.   40 capsule   0     BP 160/104  Pulse 84  Temp(Src) 98.5 F (36.9 C) (Oral)  Resp 20  SpO2 98%  LMP 06/11/2012  Physical Exam  Constitutional: She is oriented to person, place, and time. She appears well-developed and well-nourished. No distress.  HENT:  Head: Normocephalic and atraumatic.  Eyes: Conjunctivae are normal.  Neck: Neck supple.  Cardiovascular: Normal rate, regular rhythm and normal heart sounds.   Pulmonary/Chest: Effort normal and breath sounds normal.  Abdominal:  Soft.  Neurological: She is alert and oriented to person, place, and time.  Skin: Skin is warm and dry. She is not diaphoretic.  3 cm erythematous indurated abscess on posterior neck, not midline. W/o drainage.     ED Course  Procedures (including critical care time)  INCISION AND DRAINAGE Performed by: Pollyann Kennedy PA-S Consent: Verbal consent obtained. Risks and benefits: risks, benefits and alternatives were discussed Type: abscess  Body area: Posterior neck (nonmidline)  Anesthesia: local infiltration  Incision was made with a scalpel.  Local anesthetic: lidocaine 2% w/o epinephrine  Anesthetic total: 3 ml  Complexity: complex Blunt dissection to break up loculations  Drainage: purulent  Drainage amount: Copious purulent drainage  Patient tolerance: Patient tolerated the procedure well with no immediate complications.     Labs Reviewed - No data to display No results found.   1. Abscess       MDM  Patient with skin abscess amenable to incision and drainage.  Abscess was not large enough to warrant packing,  wound  recheck in 2 days. Encouraged home warm soaks and flushing.  Mild signs of cellulitis is surrounding skin.  Will d/c to home.  Antibiotic therapy is indicated. Patient agreeable to plan. Patient is stable at time of discharge          Jeannetta Ellis, PA-C 07/01/12 1006

## 2012-07-02 NOTE — ED Provider Notes (Signed)
Medical screening examination/treatment/procedure(s) were performed by non-physician practitioner and as supervising physician I was immediately available for consultation/collaboration.   Laray Anger, DO 07/02/12 1502

## 2012-07-24 ENCOUNTER — Emergency Department (HOSPITAL_COMMUNITY)
Admission: EM | Admit: 2012-07-24 | Discharge: 2012-07-24 | Disposition: A | Discharge status: Home / Self Care | Payer: Medicaid Other | Attending: Emergency Medicine | Admitting: Emergency Medicine

## 2012-07-24 ENCOUNTER — Encounter (HOSPITAL_COMMUNITY): Payer: Self-pay | Admitting: Adult Health

## 2012-07-24 DIAGNOSIS — K029 Dental caries, unspecified: Secondary | ICD-10-CM

## 2012-07-24 DIAGNOSIS — K0889 Other specified disorders of teeth and supporting structures: Secondary | ICD-10-CM

## 2012-07-24 DIAGNOSIS — Z872 Personal history of diseases of the skin and subcutaneous tissue: Secondary | ICD-10-CM | POA: Insufficient documentation

## 2012-07-24 DIAGNOSIS — E119 Type 2 diabetes mellitus without complications: Secondary | ICD-10-CM | POA: Insufficient documentation

## 2012-07-24 DIAGNOSIS — E669 Obesity, unspecified: Secondary | ICD-10-CM | POA: Insufficient documentation

## 2012-07-24 DIAGNOSIS — Z87440 Personal history of urinary (tract) infections: Secondary | ICD-10-CM | POA: Insufficient documentation

## 2012-07-24 DIAGNOSIS — Z8679 Personal history of other diseases of the circulatory system: Secondary | ICD-10-CM | POA: Insufficient documentation

## 2012-07-24 DIAGNOSIS — K1370 Unspecified lesions of oral mucosa: Secondary | ICD-10-CM | POA: Insufficient documentation

## 2012-07-24 DIAGNOSIS — K089 Disorder of teeth and supporting structures, unspecified: Secondary | ICD-10-CM | POA: Insufficient documentation

## 2012-07-24 DIAGNOSIS — Z79899 Other long term (current) drug therapy: Secondary | ICD-10-CM | POA: Insufficient documentation

## 2012-07-24 DIAGNOSIS — I10 Essential (primary) hypertension: Secondary | ICD-10-CM | POA: Insufficient documentation

## 2012-07-24 DIAGNOSIS — K121 Other forms of stomatitis: Secondary | ICD-10-CM

## 2012-07-24 DIAGNOSIS — H9209 Otalgia, unspecified ear: Secondary | ICD-10-CM | POA: Insufficient documentation

## 2012-07-24 MED ORDER — ALUM & MAG HYDROXIDE-SIMETH 400-400-40 MG/5ML PO SUSP: 5.0000 mL | Freq: Four times a day (QID) | ORAL | Status: DC | PRN

## 2012-07-24 MED ORDER — PENICILLIN V POTASSIUM 500 MG PO TABS: 500.0000 mg | ORAL_TABLET | Freq: Four times a day (QID) | ORAL | Status: DC

## 2012-07-24 MED ORDER — GI COCKTAIL ~~LOC~~
30.0000 mL | Freq: Once | ORAL | Status: AC
  Administered 2012-07-24: 30 mL via ORAL
  Filled 2012-07-24: qty 30

## 2012-07-24 NOTE — ED Notes (Addendum)
Presents with left upper dental pain that began last week. Pt states she has been unable to eat and drink due to pain since this am. Left upper back tooth decay.

## 2012-07-24 NOTE — ED Notes (Signed)
Pt c/o upper left sided dental pain. Pt states pain began hurting last week but this week pain has gotten worse and she has been unable to eat or drink. Pt states she took a goody powder this morning for the pain with no relief. Pt rates pain 8/10. Visible dental caries noted.

## 2012-07-24 NOTE — ED Provider Notes (Signed)
History    This chart was scribed for non-physician practitioner Dahlia Client Bee Hammerschmidt working with Osvaldo Human, MD by Quintella Reichert, ED Scribe. This patient was seen in room TR10C/TR10C and the patient's care was started at 6:54 PM .   CSN: [id number]  Arrival date & time 07/24/12  1528      Chief Complaint  Patient presents with  . Dental Pain     Patient is a 27 y.o. female presenting with tooth pain. The history is provided by the patient. No language interpreter was used.  Dental PainThe primary symptoms include mouth pain. Primary symptoms do not include fever or shortness of breath. The symptoms began more than 1 week ago. The symptoms are worsening. The symptoms are new. The symptoms occur constantly.  Additional symptoms include: ear pain. Associated medical issues comments: DM.    HPI Comments: Vanessa Morton is a 27 y.o. female who presents to the Emergency Department complaining of constant, gradual-onset, gradually-worsening upper left-sided dental pain that began 2 weeks ago but became much more severe throughout the past week.  Pt states that pain is exacerbated by eating and drinking, and she has been unable to eat or drink since this morning. Presently she rates severity of pain at 8/10.   She states it is not relieved by anything, and reports that she took Goody Headache Pro powder this morning for pain, with no relief.  She also reports mild left ear pain.  Pt denies any recent injury that may have caused symptoms.  She denies fever, chills, CP, SOB, abdominal pain, nausea, emesis, diarrhea, urinary symptoms, appetite change, weakness, numbness or any other associated symptoms.  Pt last saw a dentist in February, and does not have a dental appointment.  Pt medicates for DM and HTN.  She notes that her blood sugar level was 150 this morning before taking her medication.  Past Medical History  Diagnosis Date  . Hypertension   . Urinary tract infection   . Abscess    . Obesity   . Diabetes mellitus without complication   . Chest pain     Past Surgical History  Procedure Laterality Date  . Cesarean section    . Tonsillectomy      Family History  Problem Relation Age of Onset  . Hypertension Mother   . Diabetes Mother   . Diabetes Other   . Hypertension Other     History  Substance Use Topics  . Smoking status: Never Smoker   . Smokeless tobacco: Not on file  . Alcohol Use: No    OB History   Grav Para Term Preterm Abortions TAB SAB Ect Mult Living                  Review of Systems  Constitutional: Negative for fever, chills and appetite change.  HENT: Positive for ear pain.   Respiratory: Negative for shortness of breath.   Cardiovascular: Negative for chest pain.  Gastrointestinal: Negative for nausea, vomiting, abdominal pain and diarrhea.  Genitourinary: Negative for dysuria and difficulty urinating.  Neurological: Negative for weakness and numbness.  All other systems reviewed and are negative.    Allergies  Review of patient's allergies indicates no known allergies.  Home Medications   Current Outpatient Rx  Name  Route  Sig  Dispense  Refill  . Aspirin-Acetaminophen-Caffeine (GOODY HEADACHE PO)   Oral   Take 2 packets by mouth every 4 (four) hours as needed (dental pain).         Marland Kitchen  losartan (COZAAR) 50 MG tablet   Oral   Take 50 mg by mouth daily.         . metFORMIN (GLUCOPHAGE) 500 MG tablet   Oral   Take 500 mg by mouth 2 (two) times daily with a meal.         . traZODone (DESYREL) 25 mg TABS   Oral   Take 25 mg by mouth at bedtime.         Marland Kitchen alum & mag hydroxide-simeth (MAALOX MAX) 400-400-40 MG/5ML suspension   Oral   Take 5 mLs by mouth every 6 (six) hours as needed (swish for 30 sec).   360 mL   0   . penicillin v potassium (VEETID) 500 MG tablet   Oral   Take 1 tablet (500 mg total) by mouth 4 (four) times daily.   40 tablet   0     BP 147/97  Pulse 115  Temp(Src) 98.7 F  (37.1 C) (Oral)  Resp 16  SpO2 98%  LMP 06/11/2012  Physical Exam  Nursing note and vitals reviewed. Constitutional: She appears well-developed and well-nourished.  HENT:  Head: Normocephalic.  Right Ear: Tympanic membrane, external ear and ear canal normal.  Left Ear: Tympanic membrane, external ear and ear canal normal.  Nose: Nose normal. Right sinus exhibits no maxillary sinus tenderness and no frontal sinus tenderness. Left sinus exhibits no maxillary sinus tenderness and no frontal sinus tenderness.  Mouth/Throat: Uvula is midline, oropharynx is clear and moist and mucous membranes are normal. No oral lesions. Abnormal dentition. Dental caries present. No edematous or lacerations. No oropharyngeal exudate, posterior oropharyngeal edema, posterior oropharyngeal erythema or tonsillar abscesses.    Ulcerated lesion on left upper hard palate, and several candidal lesions throughout the hard palate.  No erythema, fluctuance or induration, no evidence of gross abscess.  Eyes: Conjunctivae are normal. Pupils are equal, round, and reactive to light. Right eye exhibits no discharge. Left eye exhibits no discharge.  Neck: Normal range of motion. Neck supple.  Cardiovascular: Normal rate, regular rhythm and normal heart sounds.   Pulmonary/Chest: Effort normal and breath sounds normal. No respiratory distress. She has no wheezes.  Abdominal: Soft. Bowel sounds are normal. She exhibits no distension. There is no tenderness.  Lymphadenopathy:    She has no cervical adenopathy.  Neurological: She is alert.  Skin: Skin is warm and dry.  Psychiatric: She has a normal mood and affect.    ED Course  Procedures (including critical care time)  DIAGNOSTIC STUDIES: Oxygen Saturation is 98% on room air, normal by my interpretation.    COORDINATION OF CARE: 7:01 PM-Discussed treatment plan which includes GI cocktail with pt at bedside and pt agreed to plan.      Labs Reviewed - No data to  display No results found.   1. Traumatic ulcer of oral mucosa   2. Pain, dental   3. Dental caries       MDM  Vanessa Morton presents with dental pain.  On exam pt with minor ulceration of the  hard palate, without evidence of herpes.  Pt is c/o pain at the tooth next to the ulcer, but pain is not reproduced with palpation of the tooth or lateral gumline.  NO erythema surrounding the tooth, but will treat for dental infx to be safe.  No gross abscess.  Exam unconcerning for Ludwig's angina or spread of infection.  Will treat with penicillin and magic mouth wash.  Urged patient to follow-up  with dentist.      I personally performed the services described in this documentation, which was scribed in my presence. The recorded information has been reviewed and is accurate.   Dahlia Client Blanchie Zeleznik, PA-C 07/24/12 2012

## 2012-07-25 NOTE — ED Provider Notes (Signed)
Medical screening examination/treatment/procedure(s) were performed by non-physician practitioner and as supervising physician I was immediately available for consultation/collaboration.   Carleene Cooper III, MD 07/25/12 [phone]

## 2012-09-03 ENCOUNTER — Emergency Department (HOSPITAL_COMMUNITY)
Admission: EM | Admit: 2012-09-03 | Discharge: 2012-09-03 | Disposition: A | Discharge status: Home / Self Care | Payer: Medicaid Other | Attending: Emergency Medicine | Admitting: Emergency Medicine

## 2012-09-03 ENCOUNTER — Encounter (HOSPITAL_COMMUNITY): Payer: Self-pay | Admitting: Emergency Medicine

## 2012-09-03 DIAGNOSIS — Z3202 Encounter for pregnancy test, result negative: Secondary | ICD-10-CM | POA: Insufficient documentation

## 2012-09-03 DIAGNOSIS — R300 Dysuria: Secondary | ICD-10-CM | POA: Insufficient documentation

## 2012-09-03 DIAGNOSIS — R350 Frequency of micturition: Secondary | ICD-10-CM | POA: Insufficient documentation

## 2012-09-03 DIAGNOSIS — N390 Urinary tract infection, site not specified: Secondary | ICD-10-CM | POA: Insufficient documentation

## 2012-09-03 DIAGNOSIS — Z79899 Other long term (current) drug therapy: Secondary | ICD-10-CM | POA: Insufficient documentation

## 2012-09-03 DIAGNOSIS — R3915 Urgency of urination: Secondary | ICD-10-CM | POA: Insufficient documentation

## 2012-09-03 DIAGNOSIS — E119 Type 2 diabetes mellitus without complications: Secondary | ICD-10-CM | POA: Insufficient documentation

## 2012-09-03 DIAGNOSIS — E669 Obesity, unspecified: Secondary | ICD-10-CM | POA: Insufficient documentation

## 2012-09-03 DIAGNOSIS — Z872 Personal history of diseases of the skin and subcutaneous tissue: Secondary | ICD-10-CM | POA: Insufficient documentation

## 2012-09-03 DIAGNOSIS — I10 Essential (primary) hypertension: Secondary | ICD-10-CM | POA: Insufficient documentation

## 2012-09-03 LAB — URINE MICROSCOPIC-ADD ON

## 2012-09-03 LAB — URINALYSIS, ROUTINE W REFLEX MICROSCOPIC
Protein, ur: NEGATIVE mg/dL
Urobilinogen, UA: 0.2 mg/dL (ref 0.0–1.0)

## 2012-09-03 LAB — POCT I-STAT, CHEM 8
Calcium, Ion: 1.22 mmol/L (ref 1.12–1.23)
Glucose, Bld: 135 mg/dL — ABNORMAL HIGH (ref 70–99)
HCT: 39 % (ref 36.0–46.0)
TCO2: 25 mmol/L (ref 0–100)

## 2012-09-03 LAB — POCT PREGNANCY, URINE: Preg Test, Ur: NEGATIVE

## 2012-09-03 MED ORDER — HYDROCODONE-ACETAMINOPHEN 5-325 MG PO TABS: 2.0000 | ORAL_TABLET | ORAL | Status: DC | PRN

## 2012-09-03 MED ORDER — CEPHALEXIN 500 MG PO CAPS: 500.0000 mg | ORAL_CAPSULE | Freq: Four times a day (QID) | ORAL | Status: DC

## 2012-09-03 MED ORDER — PHENAZOPYRIDINE HCL 100 MG PO TABS
100.0000 mg | ORAL_TABLET | Freq: Once | ORAL | Status: AC
  Administered 2012-09-03: 100 mg via ORAL
  Filled 2012-09-03: qty 1

## 2012-09-03 MED ORDER — PHENAZOPYRIDINE HCL 200 MG PO TABS: 200.0000 mg | ORAL_TABLET | Freq: Three times a day (TID) | ORAL | Status: DC

## 2012-09-03 MED ORDER — CEPHALEXIN 250 MG PO CAPS
250.0000 mg | ORAL_CAPSULE | Freq: Once | ORAL | Status: AC
  Administered 2012-09-03: 250 mg via ORAL
  Filled 2012-09-03: qty 1

## 2012-09-03 NOTE — ED Provider Notes (Signed)
Medical screening examination/treatment/procedure(s) were performed by non-physician practitioner and as supervising physician I was immediately available for consultation/collaboration.    Celene Kras, MD 09/03/12 [phone]

## 2012-09-03 NOTE — ED Provider Notes (Signed)
History     CSN: [id number]  Arrival date & time 09/03/12  1048   First MD Initiated Contact with Patient 09/03/12 1056      Chief Complaint  Patient presents with  . Abdominal Pain    (Consider location/radiation/quality/duration/timing/severity/associated sxs/prior treatment) HPI  Vanessa Morton is a 27 y.o.female presenting to the ER with complaints of urinary frequency, dysuria and suprapubic pain that started 1 week ago and has been progressively getting worse. She has a history of UTIs and tried to treat it at home with water and cranberry juice. She told me because of the frequency she has been intentionally drinking less fluid but is surprised that she still has the urge to urinate. She denies flank pain, nausea, vomiting, diarrhea, weakness, diarrhea, vaginal symptoms. She has a + PMH for obesity, diabetes and hypertension.    Past Medical History  Diagnosis Date  . Hypertension   . Urinary tract infection   . Abscess   . Obesity   . Diabetes mellitus without complication   . Chest pain     Past Surgical History  Procedure Laterality Date  . Cesarean section    . Tonsillectomy      Family History  Problem Relation Age of Onset  . Hypertension Mother   . Diabetes Mother   . Diabetes Other   . Hypertension Other     History  Substance Use Topics  . Smoking status: Never Smoker   . Smokeless tobacco: Not on file  . Alcohol Use: No    OB History   Grav Para Term Preterm Abortions TAB SAB Ect Mult Living                  Review of Systems  Genitourinary: Positive for dysuria, urgency and frequency.  All other systems reviewed and are negative.    Allergies  Review of patient's allergies indicates no known allergies.  Home Medications   Current Outpatient Rx  Name  Route  Sig  Dispense  Refill  . acetaminophen (TYLENOL) 325 MG tablet   Oral   Take 650 mg by mouth every 6 (six) hours as needed for pain.         Marland Kitchen ibuprofen (ADVIL,MOTRIN)  800 MG tablet   Oral   Take 800 mg by mouth every 8 (eight) hours as needed for pain.         Marland Kitchen losartan (COZAAR) 50 MG tablet   Oral   Take 50 mg by mouth every evening.          . metFORMIN (GLUCOPHAGE) 500 MG tablet   Oral   Take 500 mg by mouth every evening.          . cephALEXin (KEFLEX) 500 MG capsule   Oral   Take 1 capsule (500 mg total) by mouth 4 (four) times daily.   28 capsule   0   . HYDROcodone-acetaminophen (NORCO/VICODIN) 5-325 MG per tablet   Oral   Take 2 tablets by mouth every 4 (four) hours as needed for pain.   6 tablet   0   . phenazopyridine (PYRIDIUM) 200 MG tablet   Oral   Take 1 tablet (200 mg total) by mouth 3 (three) times daily.   6 tablet   0     BP 126/73  Pulse 99  Temp(Src) 98.4 F (36.9 C) (Oral)  Resp 16  SpO2 98%  LMP 08/17/2012  Physical Exam  Constitutional: She appears well-developed and well-nourished.  obese  HENT:  Head: Normocephalic and atraumatic.  Eyes: Conjunctivae are normal. Pupils are equal, round, and reactive to light.  Neck: Trachea normal, normal range of motion and full passive range of motion without pain. Neck supple.  Cardiovascular: Normal rate and normal pulses.   Pulmonary/Chest: Effort normal and breath sounds normal. Chest wall is not dull to percussion. She exhibits tenderness. She exhibits no crepitus, no edema, no deformity and no retraction.  Abdominal: Soft. Normal appearance. Distention: mild suprapubic tenderness. There is tenderness.  Musculoskeletal: Normal range of motion.  Neurological: She is alert. She has normal strength.  Skin: Skin is warm, dry and intact.  Psychiatric: Her speech is normal. Cognition and memory are normal.    ED Course  Procedures (including critical care time)  Labs Reviewed  URINALYSIS, ROUTINE W REFLEX MICROSCOPIC - Abnormal; Notable for the following:    APPearance TURBID (*)    Specific Gravity, Urine 1.031 (*)    Hgb urine dipstick SMALL (*)     Leukocytes, UA LARGE (*)    All other components within normal limits  URINE MICROSCOPIC-ADD ON - Abnormal; Notable for the following:    Squamous Epithelial / LPF MANY (*)    Bacteria, UA MANY (*)    All other components within normal limits  POCT I-STAT, CHEM 8 - Abnormal; Notable for the following:    Glucose, Bld 135 (*)    All other components within normal limits  URINE CULTURE  POCT PREGNANCY, URINE   No results found.   1. UTI (lower urinary tract infection)       MDM  Patient has UTI wo signs of pyleo. Will start on Keflex and Pyridium and have her follow-up with PCP. Advise to take abx until rx is completed.  27 y.o.Lauretta Chester Jablon's evaluation in the Emergency Department is complete. It has been determined that no acute conditions requiring further emergency intervention are present at this time. The patient/guardian have been advised of the diagnosis and plan. We have discussed signs and symptoms that warrant return to the ED, such as changes or worsening in symptoms.  Vital signs are stable at discharge. Filed Vitals:   09/03/12 1130  BP: 126/73  Pulse: 99  Temp:   Resp: 16    Patient/guardian has voiced understanding and agreed to follow-up with the PCP or specialist.         Dorthula Matas, PA-C 09/03/12 1207

## 2012-09-03 NOTE — ED Notes (Signed)
Patient was educated not to drive, operate heavy machinery, or drink alcohol while taking narcotic medication.  

## 2012-09-03 NOTE — ED Notes (Signed)
PA at bedside.

## 2012-09-03 NOTE — ED Notes (Signed)
Patient reports abdominal pain times one week that continued getting worse, as well as urinary frequency and odor this past week. Patient reports that she believed it was a UTI and self treated with cranberry juice and OTC UTI medication. Denies N/V/D, dizziness, vaginal discharge.

## 2012-09-04 LAB — URINE CULTURE

## 2012-09-23 ENCOUNTER — Emergency Department (HOSPITAL_COMMUNITY)
Admission: EM | Admit: 2012-09-23 | Discharge: 2012-09-23 | Disposition: A | Discharge status: Home / Self Care | Payer: Medicaid Other | Attending: Emergency Medicine | Admitting: Emergency Medicine

## 2012-09-23 ENCOUNTER — Encounter (HOSPITAL_COMMUNITY): Payer: Self-pay | Admitting: *Deleted

## 2012-09-23 DIAGNOSIS — M545 Low back pain, unspecified: Secondary | ICD-10-CM | POA: Insufficient documentation

## 2012-09-23 DIAGNOSIS — Z9189 Other specified personal risk factors, not elsewhere classified: Secondary | ICD-10-CM | POA: Insufficient documentation

## 2012-09-23 DIAGNOSIS — R3915 Urgency of urination: Secondary | ICD-10-CM | POA: Insufficient documentation

## 2012-09-23 DIAGNOSIS — Z8679 Personal history of other diseases of the circulatory system: Secondary | ICD-10-CM | POA: Insufficient documentation

## 2012-09-23 DIAGNOSIS — R350 Frequency of micturition: Secondary | ICD-10-CM | POA: Insufficient documentation

## 2012-09-23 DIAGNOSIS — N390 Urinary tract infection, site not specified: Secondary | ICD-10-CM | POA: Insufficient documentation

## 2012-09-23 DIAGNOSIS — I10 Essential (primary) hypertension: Secondary | ICD-10-CM | POA: Insufficient documentation

## 2012-09-23 DIAGNOSIS — Z3202 Encounter for pregnancy test, result negative: Secondary | ICD-10-CM | POA: Insufficient documentation

## 2012-09-23 DIAGNOSIS — Z872 Personal history of diseases of the skin and subcutaneous tissue: Secondary | ICD-10-CM | POA: Insufficient documentation

## 2012-09-23 DIAGNOSIS — Z79899 Other long term (current) drug therapy: Secondary | ICD-10-CM | POA: Insufficient documentation

## 2012-09-23 DIAGNOSIS — E119 Type 2 diabetes mellitus without complications: Secondary | ICD-10-CM | POA: Insufficient documentation

## 2012-09-23 LAB — COMPREHENSIVE METABOLIC PANEL
Albumin: 4 g/dL (ref 3.5–5.2)
Alkaline Phosphatase: 59 U/L (ref 39–117)
BUN: 12 mg/dL (ref 6–23)
Calcium: 10.2 mg/dL (ref 8.4–10.5)
Creatinine, Ser: 0.6 mg/dL (ref 0.50–1.10)
GFR calc Af Amer: >90 mL/min (ref >90)
Glucose, Bld: 143 mg/dL — ABNORMAL HIGH (ref 70–99)
Total Protein: 8 g/dL (ref 6.0–8.3)

## 2012-09-23 LAB — URINALYSIS, ROUTINE W REFLEX MICROSCOPIC
Ketones, ur: NEGATIVE mg/dL
Protein, ur: NEGATIVE mg/dL
Urobilinogen, UA: 0.2 mg/dL (ref 0.0–1.0)

## 2012-09-23 LAB — CBC WITH DIFFERENTIAL/PLATELET
Basophils Relative: 0 % (ref 0–1)
Eosinophils Absolute: 0.2 10*3/uL (ref 0.0–0.7)
Eosinophils Relative: 2 % (ref 0–5)
HCT: 36.9 % (ref 36.0–46.0)
Hemoglobin: 11.8 g/dL — ABNORMAL LOW (ref 12.0–15.0)
Lymphs Abs: 1.1 10*3/uL (ref 0.7–4.0)
MCH: 28.3 pg (ref 26.0–34.0)
MCHC: 32 g/dL (ref 30.0–36.0)
MCV: 88.5 fL (ref 78.0–100.0)
Monocytes Absolute: 0.6 10*3/uL (ref 0.1–1.0)
Monocytes Relative: 5 % (ref 3–12)
Neutrophils Relative %: 86 % — ABNORMAL HIGH (ref 43–77)
RBC: 4.17 MIL/uL (ref 3.87–5.11)

## 2012-09-23 LAB — GLUCOSE, CAPILLARY: Glucose-Capillary: 131 mg/dL — ABNORMAL HIGH (ref 70–99)

## 2012-09-23 LAB — LIPASE, BLOOD: Lipase: 19 U/L (ref 11–59)

## 2012-09-23 LAB — URINE MICROSCOPIC-ADD ON

## 2012-09-23 LAB — POCT PREGNANCY, URINE: Preg Test, Ur: NEGATIVE

## 2012-09-23 MED ORDER — ONDANSETRON HCL 4 MG PO TABS: 4.0000 mg | ORAL_TABLET | Freq: Three times a day (TID) | ORAL | Status: DC | PRN

## 2012-09-23 MED ORDER — MORPHINE SULFATE 4 MG/ML IJ SOLN
4.0000 mg | Freq: Once | INTRAMUSCULAR | Status: AC
  Administered 2012-09-23: 4 mg via INTRAVENOUS
  Filled 2012-09-23: qty 1

## 2012-09-23 MED ORDER — NITROFURANTOIN MONOHYD MACRO 100 MG PO CAPS: 100.0000 mg | ORAL_CAPSULE | Freq: Two times a day (BID) | ORAL | Status: DC

## 2012-09-23 MED ORDER — SODIUM CHLORIDE 0.9 % IV BOLUS (SEPSIS)
500.0000 mL | Freq: Once | INTRAVENOUS | Status: AC
  Administered 2012-09-23: 500 mL via INTRAVENOUS

## 2012-09-23 NOTE — ED Notes (Signed)
Pt resting quietly and watching TV. Pt states she feels a little better. Pt denies nausea. VSS.

## 2012-09-23 NOTE — ED Provider Notes (Signed)
History    CSN: [id number] Arrival date & time 09/23/12  1739  First MD Initiated Contact with Patient 09/23/12 1916     Chief Complaint  Patient presents with  . Abdominal Pain  . Back Pain   (Consider location/radiation/quality/duration/timing/severity/associated sxs/prior Treatment) HPI Comments: Pt is a 27yo female who presents with abdominal pain for several days. Pt was treated with Keflex QID for 7 days and pyridium for 3 days for a UTI about 3 weeks ago. Pt states her symptoms have not resolved since then. Pt complains of suprapubic tenderness and generalized abdominal pain and bilateral back pain. Pt endorses increased urge and frequency. No dysuria or burning, but urine still "smells strong" though not as bad as before. No SOB, chest pain, rash, change in bowel habits. No fever but some "hot flashes" and chills. No nausea or vomiting but significant pain. Pt hast taken ibuprofen and Tylenol at home without much relief. Pt has a history of diabetes, for which she takes metformin, and HTN, for which she takes Cozaar.  The history is provided by the patient. No language interpreter was used.   Past Medical History  Diagnosis Date  . Hypertension   . Urinary tract infection   . Abscess   . Obesity   . Diabetes mellitus without complication   . Chest pain    Past Surgical History  Procedure Laterality Date  . Cesarean section    . Tonsillectomy     Family History  Problem Relation Age of Onset  . Hypertension Mother   . Diabetes Mother   . Diabetes Other   . Hypertension Other    History  Substance Use Topics  . Smoking status: Never Smoker   . Smokeless tobacco: Not on file  . Alcohol Use: No   OB History   Grav Para Term Preterm Abortions TAB SAB Ect Mult Living                 Review of Systems  Constitutional: Positive for appetite change. Negative for activity change.       Little appetite with abdominal pain    Allergies  Review of patient's  allergies indicates no known allergies.  Home Medications   Current Outpatient Rx  Name  Route  Sig  Dispense  Refill  . acetaminophen (TYLENOL) 325 MG tablet   Oral   Take 650 mg by mouth every 6 (six) hours as needed for pain.         Marland Kitchen ibuprofen (ADVIL,MOTRIN) 800 MG tablet   Oral   Take 800 mg by mouth every 8 (eight) hours as needed for pain.         Marland Kitchen losartan (COZAAR) 50 MG tablet   Oral   Take 50 mg by mouth every evening.          . metFORMIN (GLUCOPHAGE) 500 MG tablet   Oral   Take 500 mg by mouth every evening.           BP 134/96  Temp(Src) 97.6 F (36.4 C) (Oral)  Resp 22  SpO2 99%  LMP 09/15/2012 Physical Exam  Nursing note and vitals reviewed. Constitutional: She is oriented to person, place, and time. She appears well-developed and well-nourished. No distress.  HENT:  Head: Normocephalic and atraumatic.  Eyes: Conjunctivae are normal. Pupils are equal, round, and reactive to light.  Neck: Normal range of motion. Neck supple.  Cardiovascular: Normal rate, regular rhythm and normal heart sounds.   No murmur heard. Pulmonary/Chest:  Effort normal and breath sounds normal. No respiratory distress. She has no wheezes.  Abdominal: Soft. Bowel sounds are normal. She exhibits no distension. There is tenderness in the suprapubic area. There is CVA tenderness. There is no rebound.  Morbidly obese. Bilateral CVA tenderness and moderate suprapubic tenderness with some generalized abdominal tenderness to deep palpation. No flank pain.  Musculoskeletal: Normal range of motion. She exhibits no edema.  Neurological: She is alert and oriented to person, place, and time. No cranial nerve deficit.  Skin: Skin is warm and dry. She is not diaphoretic. No erythema.  Psychiatric: She has a normal mood and affect. Her behavior is normal.    ED Course  Procedures (including critical care time)  1945: Exam as above, significant suprapubic and bilateral CVA tenderness.  With recent UTI s/p abx, concern for possible recurrence vs worsening/development of pyelo. Will check UA, CBC, CMP with lipase. Morphine x1. Will reassess.  2020: WBC 141. UA shows concentration, trace Hb and small leukocytes. Labs otherwise unremarkable.   Labs Reviewed  URINALYSIS, ROUTINE W REFLEX MICROSCOPIC - Abnormal; Notable for the following:    APPearance TURBID (*)    Specific Gravity, Urine 1.035 (*)    Hgb urine dipstick TRACE (*)    Leukocytes, UA SMALL (*)    All other components within normal limits  GLUCOSE, CAPILLARY - Abnormal; Notable for the following:    Glucose-Capillary 131 (*)    All other components within normal limits  CBC WITH DIFFERENTIAL - Abnormal; Notable for the following:    WBC 14.1 (*)    Hemoglobin 11.8 (*)    Platelets 426 (*)    Neutrophils Relative % 86 (*)    Neutro Abs 12.1 (*)    Lymphocytes Relative 8 (*)    All other components within normal limits  COMPREHENSIVE METABOLIC PANEL - Abnormal; Notable for the following:    Glucose, Bld 143 (*)    All other components within normal limits  URINE MICROSCOPIC-ADD ON - Abnormal; Notable for the following:    Squamous Epithelial / LPF MANY (*)    All other components within normal limits  LIPASE, BLOOD  POCT PREGNANCY, URINE   No results found. No diagnosis found.  MDM  27yo female with likely UTI either incompletely treated with Keflex or recurrent. Rx for Macrobid 100 mg BID for 7 days. Strict return precautions discussed.  The above was discussed in its entirety with attending ED physician Dr. Radford Pax.   Bobbye Morton, MD  PGY-2, [facility]  Bobbye Morton, MD 09/23/12 [phone]

## 2012-09-23 NOTE — ED Notes (Signed)
Pt c/o generalized abd pain and lower back pain. Reports she was recently treated for a UTI and symptoms have not improved after antibiotics.

## 2012-09-24 NOTE — ED Provider Notes (Signed)
I saw and evaluated the patient, reviewed the resident's note and I agree with the findings and plan.   .Face to face Exam:  General:  Awake HEENT:  Atraumatic Resp:  Normal effort Abd:  Nondistended Neuro:No focal weakness     Nelia Shi, MD 09/24/12 1529

## 2012-11-01 ENCOUNTER — Emergency Department (HOSPITAL_COMMUNITY)
Admission: EM | Admit: 2012-11-01 | Discharge: 2012-11-02 | Disposition: A | Discharge status: Home / Self Care | Payer: Medicaid Other | Attending: Emergency Medicine | Admitting: Emergency Medicine

## 2012-11-01 DIAGNOSIS — R000 Tachycardia, unspecified: Secondary | ICD-10-CM | POA: Insufficient documentation

## 2012-11-01 DIAGNOSIS — I10 Essential (primary) hypertension: Secondary | ICD-10-CM | POA: Insufficient documentation

## 2012-11-01 DIAGNOSIS — Z872 Personal history of diseases of the skin and subcutaneous tissue: Secondary | ICD-10-CM | POA: Insufficient documentation

## 2012-11-01 DIAGNOSIS — Z8679 Personal history of other diseases of the circulatory system: Secondary | ICD-10-CM | POA: Insufficient documentation

## 2012-11-01 DIAGNOSIS — N390 Urinary tract infection, site not specified: Secondary | ICD-10-CM

## 2012-11-01 DIAGNOSIS — Z3202 Encounter for pregnancy test, result negative: Secondary | ICD-10-CM | POA: Insufficient documentation

## 2012-11-01 DIAGNOSIS — R300 Dysuria: Secondary | ICD-10-CM | POA: Insufficient documentation

## 2012-11-01 DIAGNOSIS — R350 Frequency of micturition: Secondary | ICD-10-CM | POA: Insufficient documentation

## 2012-11-01 DIAGNOSIS — E119 Type 2 diabetes mellitus without complications: Secondary | ICD-10-CM | POA: Insufficient documentation

## 2012-11-01 DIAGNOSIS — M549 Dorsalgia, unspecified: Secondary | ICD-10-CM | POA: Insufficient documentation

## 2012-11-01 DIAGNOSIS — Z79899 Other long term (current) drug therapy: Secondary | ICD-10-CM | POA: Insufficient documentation

## 2012-11-02 ENCOUNTER — Encounter (HOSPITAL_COMMUNITY): Payer: Self-pay | Admitting: *Deleted

## 2012-11-02 LAB — URINALYSIS, ROUTINE W REFLEX MICROSCOPIC
Bilirubin Urine: NEGATIVE
Hgb urine dipstick: NEGATIVE
Ketones, ur: NEGATIVE mg/dL
Nitrite: NEGATIVE
Specific Gravity, Urine: 1.046 — ABNORMAL HIGH (ref 1.005–1.030)
Urobilinogen, UA: 0.2 mg/dL (ref 0.0–1.0)

## 2012-11-02 LAB — URINE MICROSCOPIC-ADD ON

## 2012-11-02 LAB — POCT PREGNANCY, URINE: Preg Test, Ur: NEGATIVE

## 2012-11-02 MED ORDER — SULFAMETHOXAZOLE-TMP DS 800-160 MG PO TABS
1.0000 | ORAL_TABLET | Freq: Once | ORAL | Status: AC
Start: 2012-11-02 — End: 2012-11-02
  Administered 2012-11-02: 1 via ORAL
  Filled 2012-11-02: qty 1

## 2012-11-02 MED ORDER — PHENAZOPYRIDINE HCL 100 MG PO TABS: 100.0000 mg | ORAL_TABLET | Freq: Three times a day (TID) | ORAL | Status: DC | PRN

## 2012-11-02 MED ORDER — SULFAMETHOXAZOLE-TMP DS 800-160 MG PO TABS: 1.0000 | ORAL_TABLET | Freq: Two times a day (BID) | ORAL | Status: DC

## 2012-11-02 MED ORDER — PHENAZOPYRIDINE HCL 100 MG PO TABS
100.0000 mg | ORAL_TABLET | Freq: Three times a day (TID) | ORAL | Status: DC
  Administered 2012-11-02: 100 mg via ORAL
  Filled 2012-11-02 (×5): qty 1

## 2012-11-02 NOTE — ED Provider Notes (Signed)
CSN: [id number]     Arrival date & time 11/01/12  2349 History     First MD Initiated Contact with Patient 11/02/12 0047     Chief Complaint  Patient presents with  . Abdominal Pain  . Back Pain   (Consider location/radiation/quality/duration/timing/severity/associated sxs/prior Treatment) HPI Comments: Is a morbidly obese, female, with a history of frequent urinary tract infections.  About once per month.  She reports, that she's had 3, days of dysuria.  Feeling, "bad."  Denies vaginal discharge.  Reports, normal menses, approximately 2 weeks, ago  Patient is a 27 y.o. female presenting with abdominal pain and back pain. The history is provided by the patient.  Abdominal Pain Pain location:  Suprapubic Pain quality: dull   Pain radiates to:  Does not radiate Pain severity:  Mild Onset quality:  Gradual Duration:  3 days Timing:  Intermittent Progression:  Worsening Chronicity:  Recurrent Context comment:  Dysuria Relieved by:  Nothing Worsened by:  Nothing tried Ineffective treatments:  None tried Associated symptoms: dysuria   Associated symptoms: no chills, no fever, no hematuria, no nausea, no vaginal bleeding and no vaginal discharge   Back Pain Associated symptoms: abdominal pain and dysuria   Associated symptoms: no fever     Past Medical History  Diagnosis Date  . Hypertension   . Urinary tract infection   . Abscess   . Obesity   . Diabetes mellitus without complication   . Chest pain    Past Surgical History  Procedure Laterality Date  . Cesarean section    . Tonsillectomy     Family History  Problem Relation Age of Onset  . Hypertension Mother   . Diabetes Mother   . Diabetes Other   . Hypertension Other    History  Substance Use Topics  . Smoking status: Never Smoker   . Smokeless tobacco: Not on file  . Alcohol Use: No   OB History   Grav Para Term Preterm Abortions TAB SAB Ect Mult Living                 Review of Systems   Constitutional: Negative for fever and chills.  Gastrointestinal: Positive for abdominal pain. Negative for nausea.  Genitourinary: Positive for dysuria and frequency. Negative for hematuria, vaginal bleeding, vaginal discharge and menstrual problem.  Musculoskeletal: Positive for back pain.  All other systems reviewed and are negative.    Allergies  Review of patient's allergies indicates no known allergies.  Home Medications   Current Outpatient Rx  Name  Route  Sig  Dispense  Refill  . acetaminophen (TYLENOL) 325 MG tablet   Oral   Take 650 mg by mouth every 6 (six) hours as needed for pain.         . Aspirin-Acetaminophen-Caffeine (GOODYS EXTRA STRENGTH PO)   Oral   Take 1 packet by mouth 2 (two) times daily as needed. For pain         . Cranberry-Vitamin C-Probiotic (AZO CRANBERRY PO)   Oral   Take 1 capsule by mouth 2 (two) times daily.         Marland Kitchen losartan (COZAAR) 50 MG tablet   Oral   Take 50 mg by mouth every evening.          . metFORMIN (GLUCOPHAGE) 500 MG tablet   Oral   Take 500 mg by mouth every evening.          . phenazopyridine (PYRIDIUM) 100 MG tablet   Oral  Take 1 tablet (100 mg total) by mouth 3 (three) times daily as needed for pain.   10 tablet   0   . sulfamethoxazole-trimethoprim (BACTRIM DS) 800-160 MG per tablet   Oral   Take 1 tablet by mouth 2 (two) times daily.   11 tablet   0    BP 135/97  Pulse 111  Temp(Src) 98.4 F (36.9 C) (Oral)  Wt 315 lb (142.883 kg)  BMI 59.55 kg/m2  SpO2 99%  LMP 10/19/2012 Physical Exam  Nursing note and vitals reviewed. Constitutional: She appears well-developed and well-nourished.  Morbidly obese  HENT:  Head: Normocephalic.  Eyes: Pupils are equal, round, and reactive to light.  Neck: Normal range of motion.  Cardiovascular: Regular rhythm.  Tachycardia present.   Pulmonary/Chest: Effort normal and breath sounds normal.  Abdominal: Soft. Bowel sounds are normal. She exhibits no  distension. There is no tenderness.  Musculoskeletal: She exhibits no edema.  Neurological: She is alert.  Skin: Skin is warm.    ED Course   Procedures (including critical care time)  Labs Reviewed  URINALYSIS, ROUTINE W REFLEX MICROSCOPIC - Abnormal; Notable for the following:    APPearance CLOUDY (*)    Specific Gravity, Urine 1.046 (*)    Leukocytes, UA SMALL (*)    All other components within normal limits  URINE MICROSCOPIC-ADD ON - Abnormal; Notable for the following:    Squamous Epithelial / LPF MANY (*)    All other components within normal limits  POCT PREGNANCY, URINE   No results found. 1. UTI (lower urinary tract infection)     MDM  Will treat with Septra for 6 days recommend patient obtain PCP  Arman Filter, NP 11/02/12 0207

## 2012-11-02 NOTE — ED Provider Notes (Signed)
Medical screening examination/treatment/procedure(s) were performed by non-physician practitioner and as supervising physician I was immediately available for consultation/collaboration.   Kyston Gonce, MD 11/02/12 0327 

## 2012-11-02 NOTE — ED Notes (Signed)
Pt states she has been having abd and back pain for 3 days, thinks she has a UTI

## 2012-11-05 ENCOUNTER — Other Ambulatory Visit: Payer: Self-pay | Admitting: *Deleted

## 2012-12-22 ENCOUNTER — Emergency Department (HOSPITAL_COMMUNITY): Payer: Medicaid Other

## 2012-12-22 ENCOUNTER — Emergency Department (HOSPITAL_COMMUNITY)
Admission: EM | Admit: 2012-12-22 | Discharge: 2012-12-22 | Disposition: A | Discharge status: Home / Self Care | Payer: Medicaid Other | Attending: Emergency Medicine | Admitting: Emergency Medicine

## 2012-12-22 ENCOUNTER — Encounter (HOSPITAL_COMMUNITY): Payer: Self-pay | Admitting: Emergency Medicine

## 2012-12-22 DIAGNOSIS — Z79899 Other long term (current) drug therapy: Secondary | ICD-10-CM | POA: Insufficient documentation

## 2012-12-22 DIAGNOSIS — E119 Type 2 diabetes mellitus without complications: Secondary | ICD-10-CM | POA: Insufficient documentation

## 2012-12-22 DIAGNOSIS — R0789 Other chest pain: Secondary | ICD-10-CM | POA: Insufficient documentation

## 2012-12-22 DIAGNOSIS — M549 Dorsalgia, unspecified: Secondary | ICD-10-CM | POA: Insufficient documentation

## 2012-12-22 DIAGNOSIS — E669 Obesity, unspecified: Secondary | ICD-10-CM | POA: Insufficient documentation

## 2012-12-22 DIAGNOSIS — R079 Chest pain, unspecified: Secondary | ICD-10-CM | POA: Insufficient documentation

## 2012-12-22 DIAGNOSIS — Z3202 Encounter for pregnancy test, result negative: Secondary | ICD-10-CM | POA: Insufficient documentation

## 2012-12-22 DIAGNOSIS — I10 Essential (primary) hypertension: Secondary | ICD-10-CM | POA: Insufficient documentation

## 2012-12-22 DIAGNOSIS — G8929 Other chronic pain: Secondary | ICD-10-CM | POA: Insufficient documentation

## 2012-12-22 DIAGNOSIS — Z87440 Personal history of urinary (tract) infections: Secondary | ICD-10-CM | POA: Insufficient documentation

## 2012-12-22 DIAGNOSIS — R109 Unspecified abdominal pain: Secondary | ICD-10-CM | POA: Insufficient documentation

## 2012-12-22 LAB — COMPREHENSIVE METABOLIC PANEL
BUN: 10 mg/dL (ref 6–23)
CO2: 25 mEq/L (ref 19–32)
Calcium: 9.1 mg/dL (ref 8.4–10.5)
Chloride: 102 mEq/L (ref 96–112)
Creatinine, Ser: 0.68 mg/dL (ref 0.50–1.10)
GFR calc Af Amer: >90 mL/min (ref >90)
GFR calc non Af Amer: >90 mL/min (ref >90)
Glucose, Bld: 97 mg/dL (ref 70–99)
Total Bilirubin: 0.2 mg/dL — ABNORMAL LOW (ref 0.3–1.2)

## 2012-12-22 LAB — CBC
Hemoglobin: 10.7 g/dL — ABNORMAL LOW (ref 12.0–15.0)
MCH: 27.4 pg (ref 26.0–34.0)
Platelets: 383 10*3/uL (ref 150–400)
RBC: 3.9 MIL/uL (ref 3.87–5.11)
WBC: 9.1 10*3/uL (ref 4.0–10.5)

## 2012-12-22 LAB — URINALYSIS, ROUTINE W REFLEX MICROSCOPIC
Glucose, UA: NEGATIVE mg/dL
Leukocytes, UA: NEGATIVE
Protein, ur: 30 mg/dL — AB
Specific Gravity, Urine: 1.044 — ABNORMAL HIGH (ref 1.005–1.030)
Urobilinogen, UA: 0.2 mg/dL (ref 0.0–1.0)

## 2012-12-22 LAB — URINE MICROSCOPIC-ADD ON

## 2012-12-22 LAB — POCT PREGNANCY, URINE: Preg Test, Ur: NEGATIVE

## 2012-12-22 MED ORDER — LIDOCAINE VISCOUS 2 % MT SOLN
15.0000 mL | Freq: Once | OROMUCOSAL | Status: AC
  Administered 2012-12-22: 15 mL via OROMUCOSAL
  Filled 2012-12-22: qty 15

## 2012-12-22 MED ORDER — ALUM & MAG HYDROXIDE-SIMETH 200-200-20 MG/5ML PO SUSP
15.0000 mL | Freq: Once | ORAL | Status: AC
  Administered 2012-12-22: 15 mL via ORAL
  Filled 2012-12-22: qty 30

## 2012-12-22 NOTE — ED Notes (Signed)
Pt c/o mid abd pain into back; pt sts some chest heaviness x 2 days; pt sts recent UTI

## 2012-12-22 NOTE — ED Notes (Signed)
Pt brought back to room; pt getting undressed and into a gown at this time 

## 2012-12-22 NOTE — ED Provider Notes (Signed)
CSN: [id number]     Arrival date & time 12/22/12  4098 History   First MD Initiated Contact with Patient 12/22/12 0830     Chief Complaint  Patient presents with  . Abdominal Pain  . Chest Pain   (Consider location/radiation/quality/duration/timing/severity/associated sxs/prior Treatment) Patient is a 27 y.o. female presenting with abdominal pain.  Abdominal Pain Pain location:  Epigastric and RUQ Pain quality: sharp   Pain radiates to:  Chest Pain severity:  Moderate Onset quality:  Gradual Duration:  2 weeks Timing:  Constant Progression:  Worsening Chronicity:  New Context: not alcohol use   Relieved by:  Nothing Worsened by:  Nothing tried Ineffective treatments:  None tried Associated symptoms: no anorexia, no chest pain, no chills, no constipation, no cough, no diarrhea, no dysuria, no fever, no nausea, no shortness of breath, no sore throat, no vaginal bleeding, no vaginal discharge and no vomiting     Past Medical History  Diagnosis Date  . Hypertension   . Urinary tract infection   . Abscess   . Obesity   . Diabetes mellitus without complication   . Chest pain    Past Surgical History  Procedure Laterality Date  . Cesarean section    . Tonsillectomy     Family History  Problem Relation Age of Onset  . Hypertension Mother   . Diabetes Mother   . Diabetes Other   . Hypertension Other    History  Substance Use Topics  . Smoking status: Never Smoker   . Smokeless tobacco: Not on file  . Alcohol Use: No   OB History   Grav Para Term Preterm Abortions TAB SAB Ect Mult Living                 Review of Systems  Constitutional: Negative for fever and chills.  HENT: Negative for congestion, sore throat and rhinorrhea.   Eyes: Negative for photophobia and visual disturbance.  Respiratory: Negative for cough and shortness of breath.   Cardiovascular: Negative for chest pain and leg swelling.  Gastrointestinal: Positive for abdominal pain. Negative for  nausea, vomiting, diarrhea, constipation and anorexia.  Endocrine: Negative for polyphagia and polyuria.  Genitourinary: Negative for dysuria, flank pain, vaginal bleeding, vaginal discharge and enuresis.  Musculoskeletal: Negative for back pain and gait problem.  Skin: Negative for color change and rash.  Neurological: Negative for dizziness, syncope, light-headedness and numbness.  Hematological: Negative for adenopathy. Does not bruise/bleed easily.  All other systems reviewed and are negative.    Allergies  Review of patient's allergies indicates no known allergies.  Home Medications   Current Outpatient Rx  Name  Route  Sig  Dispense  Refill  . acetaminophen (TYLENOL) 325 MG tablet   Oral   Take 650 mg by mouth every 6 (six) hours as needed for pain.         . Aspirin-Acetaminophen-Caffeine (GOODYS EXTRA STRENGTH PO)   Oral   Take 1 packet by mouth 2 (two) times daily as needed. For pain         . ibuprofen (ADVIL,MOTRIN) 200 MG tablet   Oral   Take 600 mg by mouth every 6 (six) hours as needed for pain.         Marland Kitchen losartan (COZAAR) 50 MG tablet   Oral   Take 50 mg by mouth every evening.          . metFORMIN (GLUCOPHAGE) 500 MG tablet   Oral   Take 500 mg by mouth every  evening.           BP 123/62  Pulse 82  Temp(Src) 98.1 F (36.7 C) (Oral)  Resp 18  Ht 5\' 2"  (1.575 m)  Wt 310 lb (140.615 kg)  BMI 56.69 kg/m2  SpO2 100% Physical Exam  Vitals reviewed. Constitutional: She is oriented to person, place, and time. She appears well-developed and well-nourished.  HENT:  Head: Normocephalic and atraumatic.  Right Ear: External ear normal.  Left Ear: External ear normal.  Eyes: Conjunctivae and EOM are normal. Pupils are equal, round, and reactive to light.  Neck: Normal range of motion. Neck supple.  Cardiovascular: Normal rate, regular rhythm, normal heart sounds and intact distal pulses.   Pulmonary/Chest: Effort normal and breath sounds normal.   Abdominal: Soft. Bowel sounds are normal. There is tenderness in the right upper quadrant and epigastric area.  Musculoskeletal: Normal range of motion.  Neurological: She is alert and oriented to person, place, and time.  Skin: Skin is warm and dry.    ED Course  Procedures (including critical care time) Labs Review Labs Reviewed  URINALYSIS, ROUTINE W REFLEX MICROSCOPIC - Abnormal; Notable for the following:    APPearance TURBID (*)    Specific Gravity, Urine 1.044 (*)    Hgb urine dipstick SMALL (*)    Bilirubin Urine SMALL (*)    Protein, ur 30 (*)    All other components within normal limits  URINE MICROSCOPIC-ADD ON - Abnormal; Notable for the following:    Squamous Epithelial / LPF MANY (*)    Bacteria, UA MANY (*)    All other components within normal limits  COMPREHENSIVE METABOLIC PANEL - Abnormal; Notable for the following:    Potassium 3.4 (*)    Total Bilirubin 0.2 (*)    All other components within normal limits  CBC - Abnormal; Notable for the following:    Hemoglobin 10.7 (*)    HCT 33.6 (*)    All other components within normal limits  URINE CULTURE  LIPASE, BLOOD  POCT PREGNANCY, URINE   Results for orders placed during the hospital encounter of 12/22/12  URINALYSIS, ROUTINE W REFLEX MICROSCOPIC      Result Value Range   Color, Urine YELLOW  YELLOW   APPearance TURBID (*) CLEAR   Specific Gravity, Urine 1.044 (*) 1.005 - 1.030   pH 5.5  5.0 - 8.0   Glucose, UA NEGATIVE  NEGATIVE mg/dL   Hgb urine dipstick SMALL (*) NEGATIVE   Bilirubin Urine SMALL (*) NEGATIVE   Ketones, ur NEGATIVE  NEGATIVE mg/dL   Protein, ur 30 (*) NEGATIVE mg/dL   Urobilinogen, UA 0.2  0.0 - 1.0 mg/dL   Nitrite NEGATIVE  NEGATIVE   Leukocytes, UA NEGATIVE  NEGATIVE  URINE MICROSCOPIC-ADD ON      Result Value Range   Squamous Epithelial / LPF MANY (*) RARE   WBC, UA 3-6  <3 WBC/hpf   RBC / HPF 3-6  <3 RBC/hpf   Bacteria, UA MANY (*) RARE  COMPREHENSIVE METABOLIC PANEL       Result Value Range   Sodium 139  135 - 145 mEq/L   Potassium 3.4 (*) 3.5 - 5.1 mEq/L   Chloride 102  96 - 112 mEq/L   CO2 25  19 - 32 mEq/L   Glucose, Bld 97  70 - 99 mg/dL   BUN 10  6 - 23 mg/dL   Creatinine, Ser 2.95  0.50 - 1.10 mg/dL   Calcium 9.1  8.4 - 62.1  mg/dL   Total Protein 7.5  6.0 - 8.3 g/dL   Albumin 3.9  3.5 - 5.2 g/dL   AST 13  0 - 37 U/L   ALT 13  0 - 35 U/L   Alkaline Phosphatase 55  39 - 117 U/L   Total Bilirubin 0.2 (*) 0.3 - 1.2 mg/dL   GFR calc non Af Amer >90  >90 mL/min   GFR calc Af Amer >90  >90 mL/min  CBC      Result Value Range   WBC 9.1  4.0 - 10.5 K/uL   RBC 3.90  3.87 - 5.11 MIL/uL   Hemoglobin 10.7 (*) 12.0 - 15.0 g/dL   HCT 16.1 (*) 09.6 - 04.5 %   MCV 86.2  78.0 - 100.0 fL   MCH 27.4  26.0 - 34.0 pg   MCHC 31.8  30.0 - 36.0 g/dL   RDW 40.9  81.1 - 91.4 %   Platelets 383  150 - 400 K/uL  LIPASE, BLOOD      Result Value Range   Lipase 53  11 - 59 U/L  POCT PREGNANCY, URINE      Result Value Range   Preg Test, Ur NEGATIVE  NEGATIVE    Imaging Review US Abdomen Complete  12/22/2012   CLINICAL DATA:  Right upper quadrant abdominal pain and chest pain.  EXAM: ULTRASOUND ABDOMEN COMPLETE  COMPARISON:  None.  FINDINGS: Gallbladder  No gallstones or wall thickening. Negative sonographic Murphy's sign.  Common bile duct  Diameter: 5 mm, normal.  Liver  No focal lesions. Slightly echogenic parenchyma consistent with hepatic steatosis.  IVC  Normal.  Pancreas  Obscured by bowel.  Spleen  Normal. 5.1 cm in length.  Right Kidney  Length: 9.8 cm. Echogenicity within normal limits. No mass or hydronephrosis.  Left Kidney  Length: 10.9 cm. Echogenicity within normal limits. No mass or hydronephrosis visualized.  Abdominal aorta  No aneurysm visualized.  Maximal diameter 2.1 cm.  IMPRESSION: Slight hepatic steatosis. Nonvisualization of the pancreas. No acute abnormalities.   Electronically Signed   By: Geanie Cooley M.D.   On: 12/22/2012 14:38    MDM   1.  Abdominal pain    27 y.o. female  with pertinent PMH of obesity, HTN, DM presents with abd pain and back pain with chest pressure x 2 weeks with acute worsening last night.  Pt has a ho chronic chest pain for which she has been seen multiple times without known etiology and has had negative [place].  Pts primary complaint today is abd pain, however she also complains of back and chest pain. Back pain worsened with movement and palpation and appears muscular skeletal in origin. Chest pain described as pressure and worse with deep breaths primarily in right upper quadrant abdomen and right lower chest. Physical exam with RUQ tenderness, +murphys.  Bedside US demonstrated ? Dilated cbd at 1.3 cm.  [place] formally unremarkable.  Likely etiology gastric irritation due to chronic goody powder use for chest pain which has been thoroughly worked up.  Stable DC home with PCP followup. Patient  given return precautions for abdominal pain was his understanding and agrees to followup.    Labs and imaging as above reviewed by myself and attending,Dr. Rhunette Croft, with whom case was discussed.   1. Abdominal pain         Noel Gerold, MD 12/22/12 1540

## 2012-12-22 NOTE — ED Notes (Signed)
PT comfortable with d/c and f/u instructions. No prescriptions given. 

## 2012-12-22 NOTE — ED Notes (Signed)
Dr. Nanvati at bedside  

## 2012-12-23 LAB — URINE CULTURE: Colony Count: 75000

## 2012-12-30 NOTE — ED Provider Notes (Signed)
EMERGENCY DEPARTMENT BILIARY ULTRASOUND INTERPRETATION "Study: Limited Abdominal Ultrasound of the gallbladder and common bile duct."  INDICATIONS: Abdominal pain Indication: Multiple views of the gallbladder and common bile duct were obtained in real-time with a Multi-frequency probe." PERFORMED BY:  Myself and Resident IMAGES ARCHIVED?: Yes FINDINGS: Gallstones absent LIMITATIONS: Body Habitus INTERPRETATION: Normal - possible choledocholithiasis  I performed a history and physical examination of  Vanessa Morton and discussed her management with Dr. Littie Deeds. I agree with the history, physical, assessment, and plan of care, with the following exceptions: None I was present for the following procedures: None  Time Spent in Critical Care of the patient: None  Time spent in discussions with the patient and family: 15 minutes  PT came in with abd pain, nausea. Epigastric pain, labs are unremarkable. Bedside US shows no gall stones, possible wall thickening and CBD dilation, formal study ordered, which is neg. Pt's exam is non peritoneal, and she has no risk fact  Hulan Amato, MD 12/30/12 2121

## 2013-05-31 ENCOUNTER — Emergency Department (HOSPITAL_COMMUNITY): Payer: Medicaid Other

## 2013-05-31 ENCOUNTER — Encounter (HOSPITAL_COMMUNITY): Payer: Self-pay | Admitting: Emergency Medicine

## 2013-05-31 ENCOUNTER — Emergency Department (HOSPITAL_COMMUNITY)
Admission: EM | Admit: 2013-05-31 | Discharge: 2013-05-31 | Disposition: A | Discharge status: Home / Self Care | Payer: Medicaid Other | Attending: Emergency Medicine | Admitting: Emergency Medicine

## 2013-05-31 DIAGNOSIS — Z87440 Personal history of urinary (tract) infections: Secondary | ICD-10-CM | POA: Insufficient documentation

## 2013-05-31 DIAGNOSIS — E669 Obesity, unspecified: Secondary | ICD-10-CM | POA: Insufficient documentation

## 2013-05-31 DIAGNOSIS — K529 Noninfective gastroenteritis and colitis, unspecified: Secondary | ICD-10-CM

## 2013-05-31 DIAGNOSIS — Z872 Personal history of diseases of the skin and subcutaneous tissue: Secondary | ICD-10-CM | POA: Insufficient documentation

## 2013-05-31 DIAGNOSIS — R109 Unspecified abdominal pain: Secondary | ICD-10-CM

## 2013-05-31 DIAGNOSIS — Z79899 Other long term (current) drug therapy: Secondary | ICD-10-CM | POA: Insufficient documentation

## 2013-05-31 DIAGNOSIS — I10 Essential (primary) hypertension: Secondary | ICD-10-CM | POA: Insufficient documentation

## 2013-05-31 DIAGNOSIS — E119 Type 2 diabetes mellitus without complications: Secondary | ICD-10-CM | POA: Insufficient documentation

## 2013-05-31 DIAGNOSIS — Z3202 Encounter for pregnancy test, result negative: Secondary | ICD-10-CM | POA: Insufficient documentation

## 2013-05-31 DIAGNOSIS — K5289 Other specified noninfective gastroenteritis and colitis: Secondary | ICD-10-CM | POA: Insufficient documentation

## 2013-05-31 LAB — URINALYSIS, ROUTINE W REFLEX MICROSCOPIC
Bilirubin Urine: NEGATIVE
GLUCOSE, UA: NEGATIVE mg/dL
Hgb urine dipstick: NEGATIVE
KETONES UR: NEGATIVE mg/dL
LEUKOCYTES UA: NEGATIVE
NITRITE: NEGATIVE
PH: 7 (ref 5.0–8.0)
Protein, ur: NEGATIVE mg/dL
Specific Gravity, Urine: 1.03 (ref 1.005–1.030)
Urobilinogen, UA: 0.2 mg/dL (ref 0.0–1.0)

## 2013-05-31 LAB — LIPASE, BLOOD: Lipase: 30 U/L (ref 11–59)

## 2013-05-31 LAB — CBC WITH DIFFERENTIAL/PLATELET
BASOS ABS: 0 10*3/uL (ref 0.0–0.1)
Basophils Relative: 0 % (ref 0–1)
EOS PCT: 2 % (ref 0–5)
Eosinophils Absolute: 0.2 10*3/uL (ref 0.0–0.7)
HCT: 35.6 % — ABNORMAL LOW (ref 36.0–46.0)
Hemoglobin: 10.7 g/dL — ABNORMAL LOW (ref 12.0–15.0)
LYMPHS ABS: 3.3 10*3/uL (ref 0.7–4.0)
Lymphocytes Relative: 26 % (ref 12–46)
MCH: 25.8 pg — ABNORMAL LOW (ref 26.0–34.0)
MCHC: 30.1 g/dL (ref 30.0–36.0)
MCV: 85.8 fL (ref 78.0–100.0)
Monocytes Absolute: 0.7 10*3/uL (ref 0.1–1.0)
Monocytes Relative: 6 % (ref 3–12)
NEUTROS PCT: 66 % (ref 43–77)
Neutro Abs: 8.4 10*3/uL — ABNORMAL HIGH (ref 1.7–7.7)
PLATELETS: 515 10*3/uL — AB (ref 150–400)
RBC: 4.15 MIL/uL (ref 3.87–5.11)
RDW: 15.8 % — AB (ref 11.5–15.5)
WBC: 12.6 10*3/uL — AB (ref 4.0–10.5)

## 2013-05-31 LAB — COMPREHENSIVE METABOLIC PANEL
ALBUMIN: 4.1 g/dL (ref 3.5–5.2)
ALK PHOS: 68 U/L (ref 39–117)
ALT: 29 U/L (ref 0–35)
AST: 20 U/L (ref 0–37)
BILIRUBIN TOTAL: 0.3 mg/dL (ref 0.3–1.2)
BUN: 12 mg/dL (ref 6–23)
CHLORIDE: 101 meq/L (ref 96–112)
CO2: 24 mEq/L (ref 19–32)
Calcium: 9.5 mg/dL (ref 8.4–10.5)
Creatinine, Ser: 0.65 mg/dL (ref 0.50–1.10)
GFR calc Af Amer: >90 mL/min (ref >90)
Glucose, Bld: 105 mg/dL — ABNORMAL HIGH (ref 70–99)
Potassium: 4.1 mEq/L (ref 3.7–5.3)
Sodium: 140 mEq/L (ref 137–147)
Total Protein: 7.8 g/dL (ref 6.0–8.3)

## 2013-05-31 LAB — PREGNANCY, URINE: Preg Test, Ur: NEGATIVE

## 2013-05-31 MED ORDER — PROMETHAZINE HCL 25 MG PO TABS: 25.0000 mg | ORAL_TABLET | Freq: Three times a day (TID) | ORAL | Status: DC | PRN

## 2013-05-31 MED ORDER — SODIUM CHLORIDE 0.9 % IV BOLUS (SEPSIS)
2000.0000 mL | Freq: Once | INTRAVENOUS | Status: AC
  Administered 2013-05-31: 2000 mL via INTRAVENOUS

## 2013-05-31 MED ORDER — IOHEXOL 300 MG/ML  SOLN
50.0000 mL | Freq: Once | INTRAMUSCULAR | Status: AC | PRN
  Administered 2013-05-31: 50 mL via ORAL

## 2013-05-31 MED ORDER — ONDANSETRON 8 MG PO TBDP
8.0000 mg | ORAL_TABLET | Freq: Once | ORAL | Status: AC
  Administered 2013-05-31: 8 mg via ORAL

## 2013-05-31 MED ORDER — MORPHINE SULFATE 4 MG/ML IJ SOLN
6.0000 mg | Freq: Once | INTRAMUSCULAR | Status: AC
  Administered 2013-05-31: 6 mg via INTRAVENOUS
  Filled 2013-05-31: qty 2

## 2013-05-31 MED ORDER — HYDROCODONE-ACETAMINOPHEN 5-325 MG PO TABS: 1.0000 | ORAL_TABLET | Freq: Four times a day (QID) | ORAL | Status: DC | PRN

## 2013-05-31 MED ORDER — IOHEXOL 300 MG/ML  SOLN
100.0000 mL | Freq: Once | INTRAMUSCULAR | Status: AC | PRN
  Administered 2013-05-31: 100 mL via INTRAVENOUS

## 2013-05-31 MED ORDER — ONDANSETRON HCL 4 MG/2ML IJ SOLN
4.0000 mg | Freq: Once | INTRAMUSCULAR | Status: AC
  Administered 2013-05-31: 4 mg via INTRAVENOUS
  Filled 2013-05-31: qty 2

## 2013-05-31 NOTE — ED Notes (Signed)
Pt states she has been having n/v since Friday with abd and back pain. Pt denies diarrhea. Pt states she has been able to tolerate po fluid. Pt states she has thrown up 3x in last 24 hours.

## 2013-05-31 NOTE — ED Provider Notes (Signed)
CSN: [id number]     Arrival date & time 05/31/13  1346 History   First MD Initiated Contact with Patient 05/31/13 1637     Chief Complaint  Patient presents with  . Emesis  . Abdominal Pain     (Consider location/radiation/quality/duration/timing/severity/associated sxs/prior Treatment) HPI: Vanessa Morton is a 28 year old woman who presents to the Emergency Department with a chief complaint of vomiting and abdominal pain.  She reports that the vomiting started Friday, stopped Saturday, and then started again Sunday.  She has vomited three times in the past 24 hours.  She reports that the abdominal pain is located in her upper abdomen and radiates through to the back.  She has taken ibuprofen, tylenol, and pepto-bismol with no relief of pain or vomiting.  She endorses anorexia and denies fevers, chills, diarrhea, and dysuria.  She has no sick contacts and has not eaten anything unusual.  She reports that her LMP was approximately one month ago and was irregular.  She states that she is sexually active and does not use barrier protection, and she is concerned about both a possible pregnancy and possible STI.     Past Medical History  Diagnosis Date  . Hypertension   . Urinary tract infection   . Abscess   . Obesity   . Diabetes mellitus without complication   . Chest pain    Past Surgical History  Procedure Laterality Date  . Cesarean section    . Tonsillectomy     Family History  Problem Relation Age of Onset  . Hypertension Mother   . Diabetes Mother   . Diabetes Other   . Hypertension Other    History  Substance Use Topics  . Smoking status: Never Smoker   . Smokeless tobacco: Not on file  . Alcohol Use: No   OB History   Grav Para Term Preterm Abortions TAB SAB Ect Mult Living                 Review of Systems    Allergies  Review of patient's allergies indicates no known allergies.  Home Medications   Current Outpatient Rx  Name  Route  Sig  Dispense  Refill   . Aspirin-Acetaminophen-Caffeine (GOODYS EXTRA STRENGTH PO)   Oral   Take 1 packet by mouth 2 (two) times daily as needed. For pain         . buPROPion (WELLBUTRIN XL) 150 MG 24 hr tablet   Oral   Take 150 mg by mouth daily. Daily for 1 week then increase to 1 tablet in am and 1 tablet at 2pm         . ibuprofen (ADVIL,MOTRIN) 200 MG tablet   Oral   Take 600 mg by mouth every 6 (six) hours as needed for pain.         Marland Kitchen HYDROcodone-acetaminophen (NORCO/VICODIN) 5-325 MG per tablet   Oral   Take 1 tablet by mouth every 6 (six) hours as needed for moderate pain.   15 tablet   0   . promethazine (PHENERGAN) 25 MG tablet   Oral   Take 1 tablet (25 mg total) by mouth every 8 (eight) hours as needed for nausea or vomiting.   15 tablet   0    BP 116/66  Pulse 90  Temp(Src) 97.8 F (36.6 C) (Oral)  Resp 20  SpO2 100%  LMP 05/17/2013 Physical Exam  Nursing note and vitals reviewed. Constitutional: She is oriented to person, place, and time.  She appears well-developed and well-nourished.  HENT:  Head: Normocephalic and atraumatic.  Mouth/Throat: Oropharynx is clear and moist.  Eyes: Pupils are equal, round, and reactive to light.  Neck: Normal range of motion. Neck supple.  Cardiovascular: Normal rate, regular rhythm and normal heart sounds.  Exam reveals no gallop and no friction rub.   No murmur heard. Pulmonary/Chest: Effort normal and breath sounds normal. No respiratory distress.  Abdominal: Soft. Bowel sounds are normal. She exhibits no distension. There is tenderness. There is no rebound and no guarding.  Patient is morbidly obese with significant central adiposity.  Tenderness to light and deep palpation of RUQ.  Negative Murphy.  Negative CVA tenderness.   Neurological: She is alert and oriented to person, place, and time.  Skin: Skin is warm and dry.    ED Course  Procedures (including critical care time) Labs Review Labs Reviewed  COMPREHENSIVE METABOLIC  PANEL - Abnormal; Notable for the following:    Glucose, Bld 105 (*)    All other components within normal limits  CBC WITH DIFFERENTIAL - Abnormal; Notable for the following:    WBC 12.6 (*)    Hemoglobin 10.7 (*)    HCT 35.6 (*)    MCH 25.8 (*)    RDW 15.8 (*)    Platelets 515 (*)    Neutro Abs 8.4 (*)    All other components within normal limits  URINALYSIS, ROUTINE W REFLEX MICROSCOPIC - Abnormal; Notable for the following:    APPearance CLOUDY (*)    All other components within normal limits  LIPASE, BLOOD  PREGNANCY, URINE  URINE MICROSCOPIC-ADD ON   Imaging Review Ct Abdomen Pelvis W Contrast  05/31/2013   CLINICAL DATA:  Abdominal pain, back pain, nausea, vomiting, history hypertension, diabetes  EXAM: CT ABDOMEN AND PELVIS WITH CONTRAST  TECHNIQUE: Multidetector CT imaging of the abdomen and pelvis was performed using the standard protocol following bolus administration of intravenous contrast. Sagittal and coronal MPR images reconstructed from axial data set.  CONTRAST:  50mL OMNIPAQUE IOHEXOL 300 MG/ML SOLN ORALLY, 100mL OMNIPAQUE IOHEXOL 300 MG/ML SOLN IV  COMPARISON:  10/18/2010  FINDINGS: Scattered respiratory motion artifacts.  Lung bases clear.  Liver, spleen, pancreas, kidneys, and adrenal glands normal appearance.  Normal appearing bladder, uterus, and ovaries.  Normal appendix.  Stomach and bowel loops normal appearance.  No mass, adenopathy, free fluid or inflammatory process.  No hernia or acute bone lesion.  IMPRESSION: Normal exam.   Electronically Signed   By: Ulyses SouthwardMark  Boles M.D.   On: 05/31/2013 18:58    Patient's tolerated oral fluids without difficulty.  CT scan did not show any acute changes.  Patient is referred to GI and advised return here as needed.  Patient most likely has a gastroenteritis.  Slowly increase her fluid intake    Vanessa DollyChristopher W Bridgit Eynon, PA-C 06/02/13 0115

## 2013-05-31 NOTE — ED Notes (Signed)
Patient is alert and oriented x3.  She was given DC instructions and follow up visit instructions.  Patient gave verbal understanding. She was DC ambulatory under her own power to home.  V/S stable.  He was not showing any signs of distress on DC 

## 2013-05-31 NOTE — Discharge Instructions (Signed)
Return here as needed.  Followup with your primary care Dr. for recheck.  Your CT scan and labs did not show any significant abnormalities

## 2013-05-31 NOTE — ED Notes (Signed)
Drinking oral constrast

## 2013-06-03 NOTE — ED Provider Notes (Signed)
Medical screening examination/treatment/procedure(s) were performed by non-physician practitioner and as supervising physician I was immediately available for consultation/collaboration.   EKG Interpretation None        Gilda Creasehristopher J. Dominik Yordy, MD 06/03/13 [phone]

## 2013-10-08 ENCOUNTER — Encounter (HOSPITAL_COMMUNITY): Payer: Self-pay | Admitting: Emergency Medicine

## 2013-10-08 ENCOUNTER — Emergency Department (HOSPITAL_COMMUNITY)
Admission: EM | Admit: 2013-10-08 | Discharge: 2013-10-08 | Disposition: A | Discharge status: Home / Self Care | Payer: Medicaid Other | Attending: Emergency Medicine | Admitting: Emergency Medicine

## 2013-10-08 DIAGNOSIS — R109 Unspecified abdominal pain: Secondary | ICD-10-CM | POA: Insufficient documentation

## 2013-10-08 DIAGNOSIS — Z79899 Other long term (current) drug therapy: Secondary | ICD-10-CM | POA: Diagnosis not present

## 2013-10-08 DIAGNOSIS — I10 Essential (primary) hypertension: Secondary | ICD-10-CM | POA: Insufficient documentation

## 2013-10-08 DIAGNOSIS — N760 Acute vaginitis: Secondary | ICD-10-CM | POA: Diagnosis not present

## 2013-10-08 DIAGNOSIS — B9689 Other specified bacterial agents as the cause of diseases classified elsewhere: Secondary | ICD-10-CM

## 2013-10-08 DIAGNOSIS — N390 Urinary tract infection, site not specified: Secondary | ICD-10-CM | POA: Diagnosis not present

## 2013-10-08 DIAGNOSIS — K2970 Gastritis, unspecified, without bleeding: Secondary | ICD-10-CM | POA: Diagnosis not present

## 2013-10-08 DIAGNOSIS — A499 Bacterial infection, unspecified: Secondary | ICD-10-CM | POA: Diagnosis not present

## 2013-10-08 DIAGNOSIS — Z872 Personal history of diseases of the skin and subcutaneous tissue: Secondary | ICD-10-CM | POA: Insufficient documentation

## 2013-10-08 DIAGNOSIS — Z3202 Encounter for pregnancy test, result negative: Secondary | ICD-10-CM | POA: Diagnosis not present

## 2013-10-08 DIAGNOSIS — E669 Obesity, unspecified: Secondary | ICD-10-CM | POA: Insufficient documentation

## 2013-10-08 DIAGNOSIS — K2990 Gastroduodenitis, unspecified, without bleeding: Secondary | ICD-10-CM

## 2013-10-08 DIAGNOSIS — E119 Type 2 diabetes mellitus without complications: Secondary | ICD-10-CM | POA: Insufficient documentation

## 2013-10-08 LAB — COMPREHENSIVE METABOLIC PANEL
ALK PHOS: 70 U/L (ref 39–117)
ALT: 17 U/L (ref 0–35)
ANION GAP: 15 (ref 5–15)
AST: 13 U/L (ref 0–37)
Albumin: 4.1 g/dL (ref 3.5–5.2)
BUN: 15 mg/dL (ref 6–23)
CHLORIDE: 102 meq/L (ref 96–112)
CO2: 23 mEq/L (ref 19–32)
Calcium: 9.9 mg/dL (ref 8.4–10.5)
Creatinine, Ser: 0.67 mg/dL (ref 0.50–1.10)
GFR calc Af Amer: >90 mL/min (ref >90)
GFR calc non Af Amer: >90 mL/min (ref >90)
Glucose, Bld: 135 mg/dL — ABNORMAL HIGH (ref 70–99)
POTASSIUM: 4.1 meq/L (ref 3.7–5.3)
SODIUM: 140 meq/L (ref 137–147)
TOTAL PROTEIN: 8.3 g/dL (ref 6.0–8.3)
Total Bilirubin: 0.2 mg/dL — ABNORMAL LOW (ref 0.3–1.2)

## 2013-10-08 LAB — URINE MICROSCOPIC-ADD ON

## 2013-10-08 LAB — URINALYSIS, ROUTINE W REFLEX MICROSCOPIC
Bilirubin Urine: NEGATIVE
GLUCOSE, UA: NEGATIVE mg/dL
Hgb urine dipstick: NEGATIVE
Ketones, ur: NEGATIVE mg/dL
Nitrite: NEGATIVE
Protein, ur: NEGATIVE mg/dL
Specific Gravity, Urine: 1.037 — ABNORMAL HIGH (ref 1.005–1.030)
UROBILINOGEN UA: 0.2 mg/dL (ref 0.0–1.0)
pH: 5.5 (ref 5.0–8.0)

## 2013-10-08 LAB — CBC WITH DIFFERENTIAL/PLATELET
BASOS ABS: 0.1 10*3/uL (ref 0.0–0.1)
BASOS PCT: 0 % (ref 0–1)
EOS ABS: 0.2 10*3/uL (ref 0.0–0.7)
Eosinophils Relative: 1 % (ref 0–5)
HCT: 33.7 % — ABNORMAL LOW (ref 36.0–46.0)
Hemoglobin: 10.1 g/dL — ABNORMAL LOW (ref 12.0–15.0)
Lymphocytes Relative: 27 % (ref 12–46)
Lymphs Abs: 3 10*3/uL (ref 0.7–4.0)
MCH: 24.5 pg — ABNORMAL LOW (ref 26.0–34.0)
MCHC: 30 g/dL (ref 30.0–36.0)
MCV: 81.8 fL (ref 78.0–100.0)
Monocytes Absolute: 0.6 10*3/uL (ref 0.1–1.0)
Monocytes Relative: 5 % (ref 3–12)
NEUTROS ABS: 7.5 10*3/uL (ref 1.7–7.7)
NEUTROS PCT: 67 % (ref 43–77)
PLATELETS: 457 10*3/uL — AB (ref 150–400)
RBC: 4.12 MIL/uL (ref 3.87–5.11)
RDW: 15.2 % (ref 11.5–15.5)
WBC: 11.3 10*3/uL — ABNORMAL HIGH (ref 4.0–10.5)

## 2013-10-08 LAB — WET PREP, GENITAL
TRICH WET PREP: NONE SEEN
Yeast Wet Prep HPF POC: NONE SEEN

## 2013-10-08 LAB — POC URINE PREG, ED: Preg Test, Ur: NEGATIVE

## 2013-10-08 LAB — PREGNANCY, URINE: Preg Test, Ur: NEGATIVE

## 2013-10-08 MED ORDER — ONDANSETRON 4 MG PO TBDP: 4.0000 mg | ORAL_TABLET | Freq: Three times a day (TID) | ORAL | Status: DC | PRN

## 2013-10-08 MED ORDER — HYDROCODONE-ACETAMINOPHEN 5-325 MG PO TABS: 1.0000 | ORAL_TABLET | ORAL | Status: DC | PRN

## 2013-10-08 MED ORDER — METRONIDAZOLE 500 MG PO TABS: 500.0000 mg | ORAL_TABLET | Freq: Two times a day (BID) | ORAL | Status: DC

## 2013-10-08 MED ORDER — OMEPRAZOLE 20 MG PO CPDR: 20.0000 mg | DELAYED_RELEASE_CAPSULE | Freq: Two times a day (BID) | ORAL | Status: DC

## 2013-10-08 MED ORDER — METRONIDAZOLE 500 MG PO TABS
500.0000 mg | ORAL_TABLET | Freq: Once | ORAL | Status: AC
  Administered 2013-10-08: 500 mg via ORAL
  Filled 2013-10-08: qty 1

## 2013-10-08 MED ORDER — NITROFURANTOIN MONOHYD MACRO 100 MG PO CAPS
100.0000 mg | ORAL_CAPSULE | Freq: Once | ORAL | Status: AC
  Administered 2013-10-08: 100 mg via ORAL
  Filled 2013-10-08 (×2): qty 1

## 2013-10-08 MED ORDER — ONDANSETRON 4 MG PO TBDP
4.0000 mg | ORAL_TABLET | Freq: Once | ORAL | Status: AC
  Administered 2013-10-08: 4 mg via ORAL
  Filled 2013-10-08: qty 1

## 2013-10-08 MED ORDER — HYDROCODONE-ACETAMINOPHEN 5-325 MG PO TABS
1.0000 | ORAL_TABLET | Freq: Once | ORAL | Status: AC
Start: 2013-10-08 — End: 2013-10-08
  Administered 2013-10-08: 1 via ORAL
  Filled 2013-10-08: qty 1

## 2013-10-08 MED ORDER — NITROFURANTOIN MONOHYD MACRO 100 MG PO CAPS: 100.0000 mg | ORAL_CAPSULE | Freq: Two times a day (BID) | ORAL | Status: DC

## 2013-10-08 NOTE — ED Notes (Signed)
Pt reports bilateral (right and left) lower abdominal pain that began 1-2 weeks ago. Pt reports lower back pain that started one week ago. Pt reports urinary frequency. Pt reports thick, white vaginal discharge, however denies vaginal bleeding. Pt is A/O x4, in NAD, and vitals are WDL.

## 2013-10-08 NOTE — Discharge Instructions (Signed)
Bacterial Vaginosis °Bacterial vaginosis is a vaginal infection that occurs when the normal balance of bacteria in the vagina is disrupted. It results from an overgrowth of certain bacteria. This is the most common vaginal infection in women of childbearing age. Treatment is important to prevent complications, especially in pregnant women, as it can cause a premature delivery. °CAUSES  °Bacterial vaginosis is caused by an increase in harmful bacteria that are normally present in smaller amounts in the vagina. Several different kinds of bacteria can cause bacterial vaginosis. However, the reason that the condition develops is not fully understood. °RISK FACTORS °Certain activities or behaviors can put you at an increased risk of developing bacterial vaginosis, including: °· Having a new sex partner or multiple sex partners. °· Douching. °· Using an intrauterine device (IUD) for contraception. °Women do not get bacterial vaginosis from toilet seats, bedding, swimming pools, or contact with objects around them. °SIGNS AND SYMPTOMS  °Some women with bacterial vaginosis have no signs or symptoms. Common symptoms include: °· Grey vaginal discharge. °· A fishlike odor with discharge, especially after sexual intercourse. °· Itching or burning of the vagina and vulva. °· Burning or pain with urination. °DIAGNOSIS  °Your health care provider will take a medical history and examine the vagina for signs of bacterial vaginosis. A sample of vaginal fluid may be taken. Your health care provider will look at this sample under a microscope to check for bacteria and abnormal cells. A vaginal pH test may also be done.  °TREATMENT  °Bacterial vaginosis may be treated with antibiotic medicines. These may be given in the form of a pill or a vaginal cream. A second round of antibiotics may be prescribed if the condition comes back after treatment.  °HOME CARE INSTRUCTIONS  °· Only take over-the-counter or prescription medicines as  directed by your health care provider. °· If antibiotic medicine was prescribed, take it as directed. Make sure you finish it even if you start to feel better. °· Do not have sex until treatment is completed. °· Tell all sexual partners that you have a vaginal infection. They should see their health care provider and be treated if they have problems, such as a mild rash or itching. °· Practice safe sex by using condoms and only having one sex partner. °SEEK MEDICAL CARE IF:  °· Your symptoms are not improving after 3 days of treatment. °· You have increased discharge or pain. °· You have a fever. °MAKE SURE YOU:  °· Understand these instructions. °· Will watch your condition. °· Will get help right away if you are not doing well or get worse. °FOR MORE INFORMATION  °Centers for Disease Control and Prevention, Division of STD Prevention: www.cdc.gov/std °American Sexual Health Association (ASHA): www.ashastd.org  °Document Released: 03/04/2005 Document Revised: 12/23/2012 Document Reviewed: 10/14/2012 °ExitCare® Patient Information ©2015 [employer]. This information is not intended to replace advice given to you by your health care provider. Make sure you discuss any questions you have with your health care provider. ° °Vaginitis °Vaginitis is an inflammation of the vagina. It is most often caused by a change in the normal balance of the bacteria and yeast that live in the vagina. This change in balance causes an overgrowth of certain bacteria or yeast, which causes the inflammation. There are different types of vaginitis, but the most common types are: °· Bacterial vaginosis. °· Yeast infection (candidiasis). °· Trichomoniasis vaginitis. This is a sexually transmitted infection (STI). °· Viral vaginitis. °· Atropic vaginitis. °· Allergic vaginitis. °  CAUSES  The cause depends on the type of vaginitis. Vaginitis can be caused by:  Bacteria (bacterial vaginosis).  Yeast (yeast infection).  A parasite  (trichomoniasis vaginitis)  A virus (viral vaginitis).  Low hormone levels (atrophic vaginitis). Low hormone levels can occur during pregnancy, breastfeeding, or after menopause.  Irritants, such as bubble baths, scented tampons, and feminine sprays (allergic vaginitis). Other factors can change the normal balance of the yeast and bacteria that live in the vagina. These include:  Antibiotic medicines.  Poor hygiene.  Diaphragms, vaginal sponges, spermicides, birth control pills, and intrauterine devices (IUD).  Sexual intercourse.  Infection.  Uncontrolled diabetes.  A weakened immune system. SYMPTOMS  Symptoms can vary depending on the cause of the vaginitis. Common symptoms include:  Abnormal vaginal discharge.  The discharge is white, gray, or yellow with bacterial vaginosis.  The discharge is thick, white, and cheesy with a yeast infection.  The discharge is frothy and yellow or greenish with trichomoniasis.  A bad vaginal odor.  The odor is fishy with bacterial vaginosis.  Vaginal itching, pain, or swelling.  Painful intercourse.  Pain or burning when urinating. Sometimes, there are no symptoms. TREATMENT  Treatment will vary depending on the type of infection.   Bacterial vaginosis and trichomoniasis are often treated with antibiotic creams or pills.  Yeast infections are often treated with antifungal medicines, such as vaginal creams or suppositories.  Viral vaginitis has no cure, but symptoms can be treated with medicines that relieve discomfort. Your sexual partner should be treated as well.  Atrophic vaginitis may be treated with an estrogen cream, pill, suppository, or vaginal ring. If vaginal dryness occurs, lubricants and moisturizing creams may help. You may be told to avoid scented soaps, sprays, or douches.  Allergic vaginitis treatment involves quitting the use of the product that is causing the problem. Vaginal creams can be used to treat the  symptoms. HOME CARE INSTRUCTIONS   Take all medicines as directed by your caregiver.  Keep your genital area clean and dry. Avoid soap and only rinse the area with water.  Avoid douching. It can remove the healthy bacteria in the vagina.  Do not use tampons or have sexual intercourse until your vaginitis has been treated. Use sanitary pads while you have vaginitis.  Wipe from front to back. This avoids the spread of bacteria from the rectum to the vagina.  Let air reach your genital area.  Wear cotton underwear to decrease moisture buildup.  Avoid wearing underwear while you sleep until your vaginitis is gone.  Avoid tight pants and underwear or nylons without a cotton panel.  Take off wet clothing (especially bathing suits) as soon as possible.  Use mild, non-scented products. Avoid using irritants, such as:  Scented feminine sprays.  Fabric softeners.  Scented detergents.  Scented tampons.  Scented soaps or bubble baths.  Practice safe sex and use condoms. Condoms may prevent the spread of trichomoniasis and viral vaginitis. SEEK MEDICAL CARE IF:   You have abdominal pain.  You have a fever or persistent symptoms for more than 2-3 days.  You have a fever and your symptoms suddenly get worse. Document Released: 12/30/2006 Document Revised: 11/27/2011 Document Reviewed: 08/15/2011 [facility] Patient Information 2015 [place], [place]. This information is not intended to replace advice given to you by your health care provider. Make sure you discuss any questions you have with your health care provider.  Urinary Tract Infection A urinary tract infection (UTI) can occur any place along the urinary tract.  The tract includes the kidneys, ureters, bladder, and urethra. A type of germ called bacteria often causes a UTI. UTIs are often helped with antibiotic medicine.  HOME CARE   If given, take antibiotics as told by your doctor. Finish them even if you start to feel  better.  Drink enough fluids to keep your pee (urine) clear or pale yellow.  Avoid tea, drinks with caffeine, and bubbly (carbonated) drinks.  Pee often. Avoid holding your pee in for a long time.  Pee before and after having sex (intercourse).  Wipe from front to back after you poop (bowel movement) if you are a woman. Use each tissue only once. GET HELP RIGHT AWAY IF:   You have back pain.  You have lower belly (abdominal) pain.  You have chills.  You feel sick to your stomach (nauseous).  You throw up (vomit).  Your burning or discomfort with peeing does not go away.  You have a fever.  Your symptoms are not better in 3 days. MAKE SURE YOU:   Understand these instructions.  Will watch your condition.  Will get help right away if you are not doing well or get worse. Document Released: 08/21/2007 Document Revised: 11/27/2011 Document Reviewed: 10/03/2011 [facility] Patient Information 2015 [place], [place]. This information is not intended to replace advice given to you by your health care provider. Make sure you discuss any questions you have with your health care provider.

## 2013-10-08 NOTE — ED Notes (Signed)
Pt has a ride home.  

## 2013-10-08 NOTE — ED Provider Notes (Signed)
CSN: [id number]     Arrival date & time 10/08/13  1629 History   First MD Initiated Contact with Patient 10/08/13 1648     Chief Complaint  Patient presents with  . Abdominal Pain  . Back Pain  . Vaginal Discharge     HPI  Patient with multiple complaints. One is vaginal discharge. #2 is burning with urination and flank pain. #3 is 2 months of epigastric pain. States she to full meal she gets discomfort in her epigastrium and frequent nausea. Vomited yesterday and today. Heme-negative nonbilious. No dark urine or light stools. No blood in her stools or black stools. Denies alcohol and tobacco use. Does not use anti-inflammatories or excessive caffeine. No diarrhea  Past Medical History  Diagnosis Date  . Hypertension   . Urinary tract infection   . Abscess   . Obesity   . Diabetes mellitus without complication   . Chest pain    Past Surgical History  Procedure Laterality Date  . Cesarean section    . Tonsillectomy     Family History  Problem Relation Age of Onset  . Hypertension Mother   . Diabetes Mother   . Diabetes Other   . Hypertension Other    History  Substance Use Topics  . Smoking status: Never Smoker   . Smokeless tobacco: Not on file  . Alcohol Use: No   OB History   Grav Para Term Preterm Abortions TAB SAB Ect Mult Living                 Review of Systems  Constitutional: Negative for fever, chills, diaphoresis, appetite change and fatigue.  HENT: Negative for mouth sores, sore throat and trouble swallowing.   Eyes: Negative for visual disturbance.  Respiratory: Negative for cough, chest tightness, shortness of breath and wheezing.   Cardiovascular: Negative for chest pain.  Gastrointestinal: Positive for nausea, vomiting and abdominal pain. Negative for diarrhea and abdominal distention.  Endocrine: Negative for polydipsia, polyphagia and polyuria.  Genitourinary: Positive for dysuria, frequency, flank pain and vaginal discharge. Negative for  hematuria.  Musculoskeletal: Negative for gait problem.  Skin: Negative for color change, pallor and rash.  Neurological: Negative for dizziness, syncope, light-headedness and headaches.  Hematological: Does not bruise/bleed easily.  Psychiatric/Behavioral: Negative for behavioral problems and confusion.      Allergies  Review of patient's allergies indicates no known allergies.  Home Medications   Prior to Admission medications   Medication Sig Start Date End Date Taking? Authorizing Provider  ibuprofen (ADVIL,MOTRIN) 200 MG tablet Take 800 mg by mouth every 6 (six) hours as needed (pain).   Yes Historical Provider, MD  zolpidem (AMBIEN) 5 MG tablet Take 5 mg by mouth at bedtime as needed for sleep (sleep).   Yes Historical Provider, MD  HYDROcodone-acetaminophen (NORCO/VICODIN) 5-325 MG per tablet Take 1 tablet by mouth every 4 (four) hours as needed. 10/08/13   Rolland PorterMark Roberts Bon, MD  metroNIDAZOLE (FLAGYL) 500 MG tablet Take 1 tablet (500 mg total) by mouth 2 (two) times daily. 10/08/13   Rolland [place]Mark Malachi Kinzler, MD  nitrofurantoin, macrocrystal-monohydrate, (MACROBID) 100 MG capsule Take 1 capsule (100 mg total) by mouth 2 (two) times daily. 10/08/13   Rolland [place]Mark Cortney Mckinney, MD  omeprazole (PRILOSEC) 20 MG capsule Take 1 capsule (20 mg total) by mouth 2 (two) times daily. 10/08/13   Rolland [place]Mark Nechelle Petrizzo, MD  ondansetron (ZOFRAN ODT) 4 MG disintegrating tablet Take 1 tablet (4 mg total) by mouth every 8 (eight) hours as needed for nausea. 10/08/13  Rolland [place], MD   BP 150/100  Pulse 110  Temp(Src) 98.1 F (36.7 C) (Oral)  Resp 18  SpO2 98%  LMP 09/29/2013 Physical Exam  Constitutional: She is oriented to person, place, and time. She appears well-developed and well-nourished. No distress.  Obese. No distress.  HENT:  Head: Normocephalic.  Eyes: Conjunctivae are normal. Pupils are equal, round, and reactive to light. No scleral icterus.  Neck: Normal range of motion. Neck supple. No thyromegaly present.  Cardiovascular:  Normal rate and regular rhythm.  Exam reveals no gallop and no friction rub.   No murmur heard. Pulmonary/Chest: Effort normal and breath sounds normal. No respiratory distress. She has no wheezes. She has no rales.  Abdominal: Soft. Bowel sounds are normal. She exhibits no distension. There is no tenderness. There is no rebound.    Genitourinary:    Musculoskeletal: Normal range of motion.       Back:  Neurological: She is alert and oriented to person, place, and time.  Skin: Skin is warm and dry. No rash noted.  Psychiatric: She has a normal mood and affect. Her behavior is normal.    ED Course  Procedures (including critical care time) Labs Review Labs Reviewed  WET PREP, GENITAL - Abnormal; Notable for the following:    Clue Cells Wet Prep HPF POC MODERATE (*)    WBC, Wet Prep HPF POC FEW (*)    All other components within normal limits  CBC WITH DIFFERENTIAL - Abnormal; Notable for the following:    WBC 11.3 (*)    Hemoglobin 10.1 (*)    HCT 33.7 (*)    MCH 24.5 (*)    Platelets 457 (*)    All other components within normal limits  COMPREHENSIVE METABOLIC PANEL - Abnormal; Notable for the following:    Glucose, Bld 135 (*)    Total Bilirubin 0.2 (*)    All other components within normal limits  URINALYSIS, ROUTINE W REFLEX MICROSCOPIC - Abnormal; Notable for the following:    APPearance CLOUDY (*)    Specific Gravity, Urine 1.037 (*)    Leukocytes, UA SMALL (*)    All other components within normal limits  URINE MICROSCOPIC-ADD ON - Abnormal; Notable for the following:    Squamous Epithelial / LPF MANY (*)    Bacteria, UA MANY (*)    All other components within normal limits  GC/CHLAMYDIA PROBE AMP  PREGNANCY, URINE  POC URINE PREG, ED    Imaging Review No results found.   EKG Interpretation None      MDM   Final diagnoses:  UTI (lower urinary tract infection)  Bacterial vaginosis  Gastritis    Pelvic exam shows discharge no tenderness. Normal  appearing cervix. Her main area of pain in the abdomen is epigastric. She has normal hepatobiliary and pancreatic enzymes. Negative pregnancy. Positive clue cells. Urine culture ending. Plan will be Macrobid, Flagyl, when necessary Zofran Vicodin. Prilosec.    Rolland [place], MD 10/08/13 [phone]

## 2013-10-09 LAB — GC/CHLAMYDIA PROBE AMP
CT Probe RNA: NEGATIVE
GC Probe RNA: NEGATIVE

## 2013-11-19 ENCOUNTER — Emergency Department (HOSPITAL_COMMUNITY): Payer: Medicaid Other

## 2013-11-19 ENCOUNTER — Encounter (HOSPITAL_COMMUNITY): Payer: Self-pay | Admitting: Emergency Medicine

## 2013-11-19 ENCOUNTER — Emergency Department (HOSPITAL_COMMUNITY)
Admission: EM | Admit: 2013-11-19 | Discharge: 2013-11-19 | Disposition: A | Discharge status: Home / Self Care | Payer: Medicaid Other | Attending: Emergency Medicine | Admitting: Emergency Medicine

## 2013-11-19 DIAGNOSIS — Z872 Personal history of diseases of the skin and subcutaneous tissue: Secondary | ICD-10-CM | POA: Insufficient documentation

## 2013-11-19 DIAGNOSIS — Z792 Long term (current) use of antibiotics: Secondary | ICD-10-CM | POA: Diagnosis not present

## 2013-11-19 DIAGNOSIS — R1011 Right upper quadrant pain: Secondary | ICD-10-CM | POA: Insufficient documentation

## 2013-11-19 DIAGNOSIS — R1031 Right lower quadrant pain: Secondary | ICD-10-CM | POA: Diagnosis not present

## 2013-11-19 DIAGNOSIS — I10 Essential (primary) hypertension: Secondary | ICD-10-CM | POA: Insufficient documentation

## 2013-11-19 DIAGNOSIS — N760 Acute vaginitis: Secondary | ICD-10-CM | POA: Diagnosis not present

## 2013-11-19 DIAGNOSIS — R109 Unspecified abdominal pain: Secondary | ICD-10-CM

## 2013-11-19 DIAGNOSIS — G8929 Other chronic pain: Secondary | ICD-10-CM | POA: Insufficient documentation

## 2013-11-19 DIAGNOSIS — E119 Type 2 diabetes mellitus without complications: Secondary | ICD-10-CM | POA: Insufficient documentation

## 2013-11-19 DIAGNOSIS — Z87440 Personal history of urinary (tract) infections: Secondary | ICD-10-CM | POA: Diagnosis not present

## 2013-11-19 DIAGNOSIS — B9689 Other specified bacterial agents as the cause of diseases classified elsewhere: Secondary | ICD-10-CM

## 2013-11-19 DIAGNOSIS — E669 Obesity, unspecified: Secondary | ICD-10-CM | POA: Insufficient documentation

## 2013-11-19 DIAGNOSIS — Z79899 Other long term (current) drug therapy: Secondary | ICD-10-CM | POA: Insufficient documentation

## 2013-11-19 LAB — URINALYSIS, ROUTINE W REFLEX MICROSCOPIC
Bilirubin Urine: NEGATIVE
Glucose, UA: NEGATIVE mg/dL
HGB URINE DIPSTICK: NEGATIVE
KETONES UR: 40 mg/dL — AB
Leukocytes, UA: NEGATIVE
Nitrite: NEGATIVE
PROTEIN: NEGATIVE mg/dL
Specific Gravity, Urine: 1.041 — ABNORMAL HIGH (ref 1.005–1.030)
Urobilinogen, UA: 0.2 mg/dL (ref 0.0–1.0)
pH: 5.5 (ref 5.0–8.0)

## 2013-11-19 LAB — COMPREHENSIVE METABOLIC PANEL
ALBUMIN: 4.2 g/dL (ref 3.5–5.2)
ALT: 15 U/L (ref 0–35)
AST: 18 U/L (ref 0–37)
Alkaline Phosphatase: 61 U/L (ref 39–117)
Anion gap: 20 — ABNORMAL HIGH (ref 5–15)
BUN: 15 mg/dL (ref 6–23)
CALCIUM: 10.4 mg/dL (ref 8.4–10.5)
CO2: 19 mEq/L (ref 19–32)
CREATININE: 0.92 mg/dL (ref 0.50–1.10)
Chloride: 99 mEq/L (ref 96–112)
GFR calc Af Amer: >90 mL/min (ref >90)
GFR calc non Af Amer: 85 mL/min — ABNORMAL LOW (ref >90)
Glucose, Bld: 92 mg/dL (ref 70–99)
Potassium: 3.9 mEq/L (ref 3.7–5.3)
Sodium: 138 mEq/L (ref 137–147)
TOTAL PROTEIN: 8.2 g/dL (ref 6.0–8.3)
Total Bilirubin: <0.2 mg/dL — ABNORMAL LOW (ref 0.3–1.2)

## 2013-11-19 LAB — CBC
HCT: 31.2 % — ABNORMAL LOW (ref 36.0–46.0)
Hemoglobin: 9.8 g/dL — ABNORMAL LOW (ref 12.0–15.0)
MCH: 24.4 pg — AB (ref 26.0–34.0)
MCHC: 31.4 g/dL (ref 30.0–36.0)
MCV: 77.8 fL — AB (ref 78.0–100.0)
PLATELETS: 521 10*3/uL — AB (ref 150–400)
RBC: 4.01 MIL/uL (ref 3.87–5.11)
RDW: 14.9 % (ref 11.5–15.5)
WBC: 14.3 10*3/uL — ABNORMAL HIGH (ref 4.0–10.5)

## 2013-11-19 LAB — LIPASE, BLOOD: LIPASE: 17 U/L (ref 11–59)

## 2013-11-19 LAB — WET PREP, GENITAL
Trich, Wet Prep: NONE SEEN
Yeast Wet Prep HPF POC: NONE SEEN

## 2013-11-19 LAB — HCG, SERUM, QUALITATIVE: Preg, Serum: NEGATIVE

## 2013-11-19 MED ORDER — METRONIDAZOLE 500 MG PO TABS: 500.0000 mg | ORAL_TABLET | Freq: Two times a day (BID) | ORAL | Status: DC

## 2013-11-19 MED ORDER — SODIUM CHLORIDE 0.9 % IV BOLUS (SEPSIS)
1000.0000 mL | Freq: Once | INTRAVENOUS | Status: AC
  Administered 2013-11-19: 1000 mL via INTRAVENOUS

## 2013-11-19 NOTE — ED Provider Notes (Signed)
CSN: [id number]     Arrival date & time 11/19/13  0250 History   First MD Initiated Contact with Patient 11/19/13 [phone]     Chief Complaint  Patient presents with  . Abdominal Pain     (Consider location/radiation/quality/duration/timing/severity/associated sxs/prior Treatment) HPI Vanessa [place] is a 28 y.o. female with a past medical history of frequent UTIs coming in with worsening abdominal pain for the past 4 months. Patient states she's been seen time to emergency departments since the onset, and has been diagnosed with UTI. She was last taking Macrobid and continue that course a couple days ago. Patient states her pain is in her bilateral upper quadrants as well as her right lower quadrant. It is worse with food. She has nausea and vomiting. She also admits to hematuria but denies being on her menstrual cycle. There is no change in her bowel movements. She does admit to fevers and chills that are subjective. She denies any recent unprotected sex.  10 Systems reviewed and are negative for acute change except as noted in the HPI.     Past Medical History  Diagnosis Date  . Hypertension   . Urinary tract infection   . Abscess   . Obesity   . Diabetes mellitus without complication   . Chest pain    Past Surgical History  Procedure Laterality Date  . Cesarean section    . Tonsillectomy     Family History  Problem Relation Age of Onset  . Hypertension Mother   . Diabetes Mother   . Diabetes Other   . Hypertension Other    History  Substance Use Topics  . Smoking status: Never Smoker   . Smokeless tobacco: Not on file  . Alcohol Use: No   OB History   Grav Para Term Preterm Abortions TAB SAB Ect Mult Living                 Review of Systems    Allergies  Review of patient's allergies indicates no known allergies.  Home Medications   Prior to Admission medications   Medication Sig Start Date End Date Taking? Authorizing Provider  HYDROcodone-acetaminophen  (NORCO/VICODIN) 5-325 MG per tablet Take 1 tablet by mouth every 4 (four) hours as needed. 10/08/13   Rol[place], MD  ibuprofen (ADVIL,MOTRIN) 200 MG tablet Take 800 mg by mouth every 6 (six) hours as needed (pain).    Historical Provider, MD  metroNIDAZOLE (FLAGYL) 500 MG tablet Take 1 tablet (500 mg total) by mouth 2 (two) times daily. 10/08/13   Rol[place], MD  nitrofurantoin, macrocrystal-monohydrate, (MACROBID) 100 MG capsule Take 1 capsule (100 mg total) by mouth 2 (two) times daily. 10/08/13   Rol[place], MD  omeprazole (PRILOSEC) 20 MG capsule Take 1 capsule (20 mg total) by mouth 2 (two) times daily. 10/08/13   [place], MD  ondansetron (ZOFRAN ODT) 4 MG disintegrating tablet Take 1 tablet (4 mg total) by mouth every 8 (eight) hours as needed for nausea. 10/08/13   Rol[place], MD  zolpidem (AMBIEN) 5 MG tablet Take 5 mg by mouth at bedtime as needed for sleep (sleep).    Historical Provider, MD   BP 151/86  Pulse 91  Temp(Src) 98.1 F (36.7 C) (Oral)  Resp 22  Wt 291 lb 1 oz (132.025 kg)  SpO2 100%  LMP 11/03/2013 Physical Exam  Nursing note and vitals reviewed. Constitutional: She is oriented to person, place, and time. She appears well-developed and well-nourished. She appears distressed.  Obese female  HENT:  Head: Normocephalic and atraumatic.  Nose: Nose normal.  Mouth/Throat: Oropharynx is clear and moist. No oropharyngeal exudate.  Eyes: Conjunctivae and EOM are normal. Pupils are equal, round, and reactive to light. No scleral icterus.  Neck: Normal range of motion. Neck supple. No JVD present. No tracheal deviation present. No thyromegaly present.  Cardiovascular: Normal rate, regular rhythm and normal heart sounds.  Exam reveals no gallop and no friction rub.   No murmur heard. Distant sounds  Pulmonary/Chest: Effort normal and breath sounds normal. No respiratory distress. She has no wheezes. She exhibits no tenderness.  Distant sounds  Abdominal: Soft. Bowel sounds  are normal. She exhibits no distension and no mass. There is no tenderness. There is no rebound and no guarding.  Difficult to perform abdominal exam on her abdomen. Tenderness appears to be diffuse, perhaps worse in the right upper quadrant and right lower quadrant. Murphy sign is negative.  Genitourinary: Vagina normal and uterus normal. No vaginal discharge found.  Left adnexal tenderness. No CMT  Musculoskeletal: Normal range of motion. She exhibits no edema and no tenderness.  Lymphadenopathy:    She has no cervical adenopathy.  Neurological: She is alert and oriented to person, place, and time. No cranial nerve deficit. She exhibits normal muscle tone.  Skin: Skin is warm and dry. No rash noted. She is not diaphoretic. No erythema. No pallor.    ED Course  Procedures (including critical care time) Labs Review Labs Reviewed  WET PREP, GENITAL - Abnormal; Notable for the following:    Clue Cells Wet Prep HPF POC MANY (*)    WBC, Wet Prep HPF POC FEW (*)    All other components within normal limits  URINALYSIS, ROUTINE W REFLEX MICROSCOPIC - Abnormal; Notable for the following:    Specific Gravity, Urine 1.041 (*)    Ketones, ur 40 (*)    All other components within normal limits  CBC - Abnormal; Notable for the following:    WBC 14.3 (*)    Hemoglobin 9.8 (*)    HCT 31.2 (*)    MCV 77.8 (*)    MCH 24.4 (*)    Platelets 521 (*)    All other components within normal limits  COMPREHENSIVE METABOLIC PANEL - Abnormal; Notable for the following:    Total Bilirubin <0.2 (*)    GFR calc non Af Amer 85 (*)    Anion gap 20 (*)    All other components within normal limits  GC/CHLAMYDIA PROBE AMP  LIPASE, BLOOD  HCG, SERUM, QUALITATIVE    Imaging Review No results found.   EKG Interpretation None      MDM   Final diagnoses:  None    Patient presents to emergency department out of concern for abdominal pain for 4 months. She denies having imaging during the interval.  Will Further assess with transvaginal and abdominal ultrasound. Urinalysis did not reveal an infection. Patient was given fluid.  PAtient was signed out to oncoming provider.  Please see his note for the ultimate disposition.     Vanessa Morton Crumble, MD 11/20/13 [phone]

## 2013-11-19 NOTE — ED Notes (Signed)
Pt OTF for U/S

## 2013-11-19 NOTE — ED Notes (Signed)
Pt d/c'd from IV, continuous pulse oximetry and blood pressure cuff; pt getting dressed to be discharged home 

## 2013-11-19 NOTE — ED Notes (Signed)
Pt reports ongoing abdominal pain, nv, and lower back pain since she was here in aug for UTI and BV. sts she took medications as prescribed and even had another course of antibiotics prescribed by her pcp.

## 2013-11-19 NOTE — ED Provider Notes (Signed)
Assumed care of pt from Dr. Mora Bellman.  Briefly patient presents with chronic ongoing abdominal imaging.  She has had no acute increasing pain per report from prior provider.  On assumption of care awaiting ultrasound of abdomen, as pain increased after eating has been associated with nausea and vomiting. Pelvic exam demonstrated clue cells. Ultrasound unremarkable for source of pain. Patient was given a prescription for Flagyl, abdominal pain precautions, voiced understanding and agreed to followup.  Mirian Mo, MD 11/19/13 [phone]

## 2013-11-19 NOTE — Discharge Instructions (Signed)
Abdominal Pain, Women °Abdominal (stomach, pelvic, or belly) pain can be caused by many things. It is important to tell your doctor: °· The location of the pain. °· Does it come and go or is it present all the time? °· Are there things that start the pain (eating certain foods, exercise)? °· Are there other symptoms associated with the pain (fever, nausea, vomiting, diarrhea)? °All of this is helpful to know when trying to find the cause of the pain. °CAUSES  °· Stomach: virus or bacteria infection, or ulcer. °· Intestine: appendicitis (inflamed appendix), regional ileitis (Crohn's disease), ulcerative colitis (inflamed colon), irritable bowel syndrome, diverticulitis (inflamed diverticulum of the colon), or cancer of the stomach or intestine. °· Gallbladder disease or stones in the gallbladder. °· Kidney disease, kidney stones, or infection. °· Pancreas infection or cancer. °· Fibromyalgia (pain disorder). °· Diseases of the female organs: °· Uterus: fibroid (non-cancerous) tumors or infection. °· Fallopian tubes: infection or tubal pregnancy. °· Ovary: cysts or tumors. °· Pelvic adhesions (scar tissue). °· Endometriosis (uterus lining tissue growing in the pelvis and on the pelvic organs). °· Pelvic congestion syndrome (female organs filling up with blood just before the menstrual period). °· Pain with the menstrual period. °· Pain with ovulation (producing an egg). °· Pain with an IUD (intrauterine device, birth control) in the uterus. °· Cancer of the female organs. °· Functional pain (pain not caused by a disease, may improve without treatment). °· Psychological pain. °· Depression. °DIAGNOSIS  °Your doctor will decide the seriousness of your pain by doing an examination. °· Blood tests. °· X-rays. °· Ultrasound. °· CT scan (computed tomography, special type of X-ray). °· MRI (magnetic resonance imaging). °· Cultures, for infection. °· Barium enema (dye inserted in the large intestine, to better view it with  X-rays). °· Colonoscopy (looking in intestine with a lighted tube). °· Laparoscopy (minor surgery, looking in abdomen with a lighted tube). °· Major abdominal exploratory surgery (looking in abdomen with a large incision). °TREATMENT  °The treatment will depend on the cause of the pain.  °· Many cases can be observed and treated at home. °· Over-the-counter medicines recommended by your caregiver. °· Prescription medicine. °· Antibiotics, for infection. °· Birth control pills, for painful periods or for ovulation pain. °· Hormone treatment, for endometriosis. °· Nerve blocking injections. °· Physical therapy. °· Antidepressants. °· Counseling with a psychologist or psychiatrist. °· Minor or major surgery. °HOME CARE INSTRUCTIONS  °· Do not take laxatives, unless directed by your caregiver. °· Take over-the-counter pain medicine only if ordered by your caregiver. Do not take aspirin because it can cause an upset stomach or bleeding. °· Try a clear liquid diet (broth or water) as ordered by your caregiver. Slowly move to a bland diet, as tolerated, if the pain is related to the stomach or intestine. °· Have a thermometer and take your temperature several times a day, and record it. °· Bed rest and sleep, if it helps the pain. °· Avoid sexual intercourse, if it causes pain. °· Avoid stressful situations. °· Keep your follow-up appointments and tests, as your caregiver orders. °· If the pain does not go away with medicine or surgery, you may try: °· Acupuncture. °· Relaxation exercises (yoga, meditation). °· Group therapy. °· Counseling. °SEEK MEDICAL CARE IF:  °· You notice certain foods cause stomach pain. °· Your home care treatment is not helping your pain. °· You need stronger pain medicine. °· You want your IUD removed. °· You feel faint or   lightheaded. °· You develop nausea and vomiting. °· You develop a rash. °· You are having side effects or an allergy to your medicine. °SEEK IMMEDIATE MEDICAL CARE IF:  °· Your  pain does not go away or gets worse. °· You have a fever. °· Your pain is felt only in portions of the abdomen. The right side could possibly be appendicitis. The left lower portion of the abdomen could be colitis or diverticulitis. °· You are passing blood in your stools (bright red or black tarry stools, with or without vomiting). °· You have blood in your urine. °· You develop chills, with or without a fever. °· You pass out. °MAKE SURE YOU:  °· Understand these instructions. °· Will watch your condition. °· Will get help right away if you are not doing well or get worse. °Document Released: 12/30/2006 Document Revised: 07/19/2013 Document Reviewed: 01/19/2009 °ExitCare® Patient Information ©2015 [employer]. This information is not intended to replace advice given to you by your health care provider. Make sure you discuss any questions you have with your health care provider. °Bacterial Vaginosis °Bacterial vaginosis is a vaginal infection that occurs when the normal balance of bacteria in the vagina is disrupted. It results from an overgrowth of certain bacteria. This is the most common vaginal infection in women of childbearing age. Treatment is important to prevent complications, especially in pregnant women, as it can cause a premature delivery. °CAUSES  °Bacterial vaginosis is caused by an increase in harmful bacteria that are normally present in smaller amounts in the vagina. Several different kinds of bacteria can cause bacterial vaginosis. However, the reason that the condition develops is not fully understood. °RISK FACTORS °Certain activities or behaviors can put you at an increased risk of developing bacterial vaginosis, including: °· Having a new sex partner or multiple sex partners. °· Douching. °· Using an intrauterine device (IUD) for contraception. °Women do not get bacterial vaginosis from toilet seats, bedding, swimming pools, or contact with objects around them. °SIGNS AND SYMPTOMS  °Some  women with bacterial vaginosis have no signs or symptoms. Common symptoms include: °· Grey vaginal discharge. °· A fishlike odor with discharge, especially after sexual intercourse. °· Itching or burning of the vagina and vulva. °· Burning or pain with urination. °DIAGNOSIS  °Your health care provider will take a medical history and examine the vagina for signs of bacterial vaginosis. A sample of vaginal fluid may be taken. Your health care provider will look at this sample under a microscope to check for bacteria and abnormal cells. A vaginal pH test may also be done.  °TREATMENT  °Bacterial vaginosis may be treated with antibiotic medicines. These may be given in the form of a pill or a vaginal cream. A second round of antibiotics may be prescribed if the condition comes back after treatment.  °HOME CARE INSTRUCTIONS  °· Only take over-the-counter or prescription medicines as directed by your health care provider. °· If antibiotic medicine was prescribed, take it as directed. Make sure you finish it even if you start to feel better. °· Do not have sex until treatment is completed. °· Tell all sexual partners that you have a vaginal infection. They should see their health care provider and be treated if they have problems, such as a mild rash or itching. °· Practice safe sex by using condoms and only having one sex partner. °SEEK MEDICAL CARE IF:  °· Your symptoms are not improving after 3 days of treatment. °· You have increased discharge   or pain. °· You have a fever. °MAKE SURE YOU:  °· Understand these instructions. °· Will watch your condition. °· Will get help right away if you are not doing well or get worse. °FOR MORE INFORMATION  °Centers for Disease Control and Prevention, Division of STD Prevention: www.cdc.gov/std °American Sexual Health Association (ASHA): www.ashastd.org  °Document Released: 03/04/2005 Document Revised: 12/23/2012 Document Reviewed: 10/14/2012 °ExitCare® Patient Information ©2015  [employer]. This information is not intended to replace advice given to you by your health care provider. Make sure you discuss any questions you have with your health care provider. ° °

## 2013-11-20 LAB — GC/CHLAMYDIA PROBE AMP
CT PROBE, AMP APTIMA: NEGATIVE
GC Probe RNA: NEGATIVE

## 2014-01-11 ENCOUNTER — Emergency Department (HOSPITAL_COMMUNITY)
Admission: EM | Admit: 2014-01-11 | Discharge: 2014-01-11 | Disposition: A | Discharge status: Home / Self Care | Payer: Medicaid Other | Attending: Emergency Medicine | Admitting: Emergency Medicine

## 2014-01-11 ENCOUNTER — Encounter (HOSPITAL_COMMUNITY): Payer: Self-pay | Admitting: Emergency Medicine

## 2014-01-11 DIAGNOSIS — Z9889 Other specified postprocedural states: Secondary | ICD-10-CM | POA: Insufficient documentation

## 2014-01-11 DIAGNOSIS — Z872 Personal history of diseases of the skin and subcutaneous tissue: Secondary | ICD-10-CM | POA: Insufficient documentation

## 2014-01-11 DIAGNOSIS — M549 Dorsalgia, unspecified: Secondary | ICD-10-CM | POA: Diagnosis not present

## 2014-01-11 DIAGNOSIS — I10 Essential (primary) hypertension: Secondary | ICD-10-CM | POA: Insufficient documentation

## 2014-01-11 DIAGNOSIS — Z87440 Personal history of urinary (tract) infections: Secondary | ICD-10-CM | POA: Diagnosis not present

## 2014-01-11 DIAGNOSIS — Z3202 Encounter for pregnancy test, result negative: Secondary | ICD-10-CM | POA: Diagnosis not present

## 2014-01-11 DIAGNOSIS — R109 Unspecified abdominal pain: Secondary | ICD-10-CM

## 2014-01-11 DIAGNOSIS — R1030 Lower abdominal pain, unspecified: Secondary | ICD-10-CM | POA: Diagnosis not present

## 2014-01-11 LAB — URINALYSIS, ROUTINE W REFLEX MICROSCOPIC
Bilirubin Urine: NEGATIVE
GLUCOSE, UA: NEGATIVE mg/dL
Hgb urine dipstick: NEGATIVE
Ketones, ur: NEGATIVE mg/dL
LEUKOCYTES UA: NEGATIVE
Nitrite: NEGATIVE
PH: 5.5 (ref 5.0–8.0)
Protein, ur: NEGATIVE mg/dL
Specific Gravity, Urine: 1.034 — ABNORMAL HIGH (ref 1.005–1.030)
Urobilinogen, UA: 0.2 mg/dL (ref 0.0–1.0)

## 2014-01-11 LAB — COMPREHENSIVE METABOLIC PANEL
ALT: 14 U/L (ref 0–35)
ANION GAP: 13 (ref 5–15)
AST: 29 U/L (ref 0–37)
Albumin: 3.7 g/dL (ref 3.5–5.2)
Alkaline Phosphatase: 52 U/L (ref 39–117)
BUN: 10 mg/dL (ref 6–23)
CALCIUM: 9.1 mg/dL (ref 8.4–10.5)
CO2: 22 mEq/L (ref 19–32)
Chloride: 104 mEq/L (ref 96–112)
Creatinine, Ser: 0.58 mg/dL (ref 0.50–1.10)
GFR calc Af Amer: >90 mL/min (ref >90)
Glucose, Bld: 111 mg/dL — ABNORMAL HIGH (ref 70–99)
Potassium: 4.8 mEq/L (ref 3.7–5.3)
Sodium: 139 mEq/L (ref 137–147)
Total Bilirubin: <0.2 mg/dL — ABNORMAL LOW (ref 0.3–1.2)
Total Protein: 7.7 g/dL (ref 6.0–8.3)

## 2014-01-11 LAB — CBC WITH DIFFERENTIAL/PLATELET
BASOS ABS: 0 10*3/uL (ref 0.0–0.1)
BASOS PCT: 0 % (ref 0–1)
Eosinophils Absolute: 0.2 10*3/uL (ref 0.0–0.7)
Eosinophils Relative: 2 % (ref 0–5)
HEMATOCRIT: 30.8 % — AB (ref 36.0–46.0)
Hemoglobin: 9.3 g/dL — ABNORMAL LOW (ref 12.0–15.0)
Lymphocytes Relative: 27 % (ref 12–46)
Lymphs Abs: 2.9 10*3/uL (ref 0.7–4.0)
MCH: 23.6 pg — AB (ref 26.0–34.0)
MCHC: 30.2 g/dL (ref 30.0–36.0)
MCV: 78.2 fL (ref 78.0–100.0)
Monocytes Absolute: 0.5 10*3/uL (ref 0.1–1.0)
Monocytes Relative: 5 % (ref 3–12)
Neutro Abs: 7.2 10*3/uL (ref 1.7–7.7)
Neutrophils Relative %: 66 % (ref 43–77)
Platelets: 421 10*3/uL — ABNORMAL HIGH (ref 150–400)
RBC: 3.94 MIL/uL (ref 3.87–5.11)
RDW: 15.7 % — ABNORMAL HIGH (ref 11.5–15.5)
WBC: 10.9 10*3/uL — ABNORMAL HIGH (ref 4.0–10.5)

## 2014-01-11 LAB — LIPASE, BLOOD: LIPASE: 23 U/L (ref 11–59)

## 2014-01-11 LAB — WET PREP, GENITAL
Clue Cells Wet Prep HPF POC: NONE SEEN
TRICH WET PREP: NONE SEEN
WBC, Wet Prep HPF POC: NONE SEEN
Yeast Wet Prep HPF POC: NONE SEEN

## 2014-01-11 LAB — POC URINE PREG, ED: Preg Test, Ur: NEGATIVE

## 2014-01-11 MED ORDER — SODIUM CHLORIDE 0.9 % IV BOLUS (SEPSIS): 1000.0000 mL | Freq: Once | INTRAVENOUS | Status: DC

## 2014-01-11 MED ORDER — ONDANSETRON HCL 4 MG/2ML IJ SOLN: 4.0000 mg | Freq: Once | INTRAMUSCULAR | Status: DC

## 2014-01-11 MED ORDER — PROMETHAZINE HCL 25 MG PO TABS: 25.0000 mg | ORAL_TABLET | Freq: Four times a day (QID) | ORAL | Status: DC | PRN

## 2014-01-11 MED ORDER — OXYCODONE-ACETAMINOPHEN 5-325 MG PO TABS: 1.0000 | ORAL_TABLET | Freq: Four times a day (QID) | ORAL | Status: DC | PRN

## 2014-01-11 MED ORDER — MORPHINE SULFATE 4 MG/ML IJ SOLN: 4.0000 mg | Freq: Once | INTRAMUSCULAR | Status: DC

## 2014-01-11 NOTE — ED Notes (Signed)
PA was notified that pelvic is set up and ready.

## 2014-01-11 NOTE — ED Provider Notes (Signed)
CSN: [id number]     Arrival date & time 01/11/14  1003 History   First MD Initiated Contact with Patient 01/11/14 1051     Chief Complaint  Patient presents with  . Abdominal Pain     (Consider location/radiation/quality/duration/timing/severity/associated sxs/prior Treatment) HPI Comments: Patient is a 28 year old female with history of hypertension who presents to emergency department today for evaluation of lower abdominal pain with radiation to her back. This is ongoing for the past 5 days. She has associated urinary frequency. She reports that she had a urinary tract infection one month ago and finished a course of antibiotics approximately 2 weeks ago. She denies any nausea, vomiting, diarrhea. She reports that she has burning in her vaginal area. She used a scented tampon which is abnormal for her and feels as though she has irritation from that. No fevers, chills, chest pain, shortness of breath.   Patient is a 28 y.o. female presenting with abdominal pain. The history is provided by the patient. No language interpreter was used.  Abdominal Pain Associated symptoms: no chest pain, no chills, no diarrhea, no dysuria, no fever, no nausea, no shortness of breath, no vaginal discharge and no vomiting     Past Medical History  Diagnosis Date  . Hypertension   . Urinary tract infection   . Abscess   . Obesity   . Chest pain    Past Surgical History  Procedure Laterality Date  . Cesarean section    . Tonsillectomy     Family History  Problem Relation Age of Onset  . Hypertension Mother   . Diabetes Mother   . Diabetes Other   . Hypertension Other    History  Substance Use Topics  . Smoking status: Never Smoker   . Smokeless tobacco: Not on file  . Alcohol Use: No   OB History   Grav Para Term Preterm Abortions TAB SAB Ect Mult Living                 Review of Systems  Constitutional: Negative for fever and chills.  Respiratory: Negative for shortness of breath.    Cardiovascular: Negative for chest pain.  Gastrointestinal: Positive for abdominal pain. Negative for nausea, vomiting and diarrhea.  Genitourinary: Positive for frequency and vaginal pain (irritation). Negative for dysuria and vaginal discharge.  Musculoskeletal: Positive for back pain.  All other systems reviewed and are negative.     Allergies  Review of patient's allergies indicates no known allergies.  Home Medications   Prior to Admission medications   Medication Sig Start Date End Date Taking? Authorizing Provider  ibuprofen (ADVIL,MOTRIN) 800 MG tablet Take 800 mg by mouth every 6 (six) hours as needed for moderate pain.   Yes Historical Provider, MD   BP 145/80  Pulse 78  Temp(Src) 98.3 F (36.8 C) (Oral)  Resp 16  SpO2 99%  LMP 12/28/2013 Physical Exam  Nursing note and vitals reviewed. Constitutional: She is oriented to person, place, and time. She appears well-developed and well-nourished. No distress.  Morbidly obese  HENT:  Head: Normocephalic and atraumatic.  Right Ear: External ear normal.  Left Ear: External ear normal.  Nose: Nose normal.  Mouth/Throat: Oropharynx is clear and moist.  Eyes: Conjunctivae are normal.  Neck: Normal range of motion.  Cardiovascular: Normal rate, regular rhythm and normal heart sounds.   Pulmonary/Chest: Effort normal and breath sounds normal. No stridor. No respiratory distress. She has no wheezes. She has no rales.  Abdominal: Soft. She exhibits no  distension. There is tenderness in the suprapubic area. There is no rigidity, no rebound, no guarding and no CVA tenderness.  Genitourinary: There is no rash, tenderness or lesion on the right labia. There is no rash, tenderness or lesion on the left labia. Uterus is not tender. Cervix exhibits no motion tenderness, no discharge and no friability. Right adnexum displays no mass and no tenderness. Left adnexum displays no mass and no tenderness. No erythema around the vagina. No  foreign body around the vagina. No signs of injury around the vagina. No vaginal discharge found.  Musculoskeletal: Normal range of motion.  Neurological: She is alert and oriented to person, place, and time. She has normal strength.  Skin: Skin is warm and dry. She is not diaphoretic. No erythema.  Psychiatric: She has a normal mood and affect. Her behavior is normal.    ED Course  Procedures (including critical care time) Labs Review Labs Reviewed  URINALYSIS, ROUTINE W REFLEX MICROSCOPIC - Abnormal; Notable for the following:    APPearance CLOUDY (*)    Specific Gravity, Urine 1.034 (*)    All other components within normal limits  CBC WITH DIFFERENTIAL - Abnormal; Notable for the following:    WBC 10.9 (*)    Hemoglobin 9.3 (*)    HCT 30.8 (*)    MCH 23.6 (*)    RDW 15.7 (*)    Platelets 421 (*)    All other components within normal limits  COMPREHENSIVE METABOLIC PANEL - Abnormal; Notable for the following:    Glucose, Bld 111 (*)    Total Bilirubin <0.2 (*)    All other components within normal limits  WET PREP, GENITAL  GC/CHLAMYDIA PROBE AMP  LIPASE, BLOOD  POC URINE PREG, ED    Imaging Review No results found.   EKG Interpretation None      MDM   Final diagnoses:  Abdominal pain, unspecified abdominal location   Patient presents to ED for evaluation of abdominal pain and urinary frequency. No concern for appendicitis, cholecystitis, diverticulitis. Abdomen is non surgical. UA is nitrate and leukocyte negative. Labs unremarkable. Wet prep is unremarkable. Patient encouraged to avoid scented tampons. Follow up with PCP. Discussed reasons to return to ED. Vital signs stable for discharge. Patient / Family / Caregiver informed of clinical course, understand medical decision-making process, and agree with plan.     Mora BellmanHannah S Jannely Henthorn, PA-C 01/12/14 [phone]

## 2014-01-11 NOTE — ED Notes (Signed)
Pelvic cart set up 

## 2014-01-11 NOTE — Discharge Instructions (Signed)
Abdominal Pain, Women °Abdominal (stomach, pelvic, or belly) pain can be caused by many things. It is important to tell your doctor: °· The location of the pain. °· Does it come and go or is it present all the time? °· Are there things that start the pain (eating certain foods, exercise)? °· Are there other symptoms associated with the pain (fever, nausea, vomiting, diarrhea)? °All of this is helpful to know when trying to find the cause of the pain. °CAUSES  °· Stomach: virus or bacteria infection, or ulcer. °· Intestine: appendicitis (inflamed appendix), regional ileitis (Crohn's disease), ulcerative colitis (inflamed colon), irritable bowel syndrome, diverticulitis (inflamed diverticulum of the colon), or cancer of the stomach or intestine. °· Gallbladder disease or stones in the gallbladder. °· Kidney disease, kidney stones, or infection. °· Pancreas infection or cancer. °· Fibromyalgia (pain disorder). °· Diseases of the female organs: °¨ Uterus: fibroid (non-cancerous) tumors or infection. °¨ Fallopian tubes: infection or tubal pregnancy. °¨ Ovary: cysts or tumors. °¨ Pelvic adhesions (scar tissue). °¨ Endometriosis (uterus lining tissue growing in the pelvis and on the pelvic organs). °¨ Pelvic congestion syndrome (female organs filling up with blood just before the menstrual period). °¨ Pain with the menstrual period. °¨ Pain with ovulation (producing an egg). °¨ Pain with an IUD (intrauterine device, birth control) in the uterus. °¨ Cancer of the female organs. °· Functional pain (pain not caused by a disease, may improve without treatment). °· Psychological pain. °· Depression. °DIAGNOSIS  °Your doctor will decide the seriousness of your pain by doing an examination. °· Blood tests. °· X-rays. °· Ultrasound. °· CT scan (computed tomography, special type of X-ray). °· MRI (magnetic resonance imaging). °· Cultures, for infection. °· Barium enema (dye inserted in the large intestine, to better view it with  X-rays). °· Colonoscopy (looking in intestine with a lighted tube). °· Laparoscopy (minor surgery, looking in abdomen with a lighted tube). °· Major abdominal exploratory surgery (looking in abdomen with a large incision). °TREATMENT  °The treatment will depend on the cause of the pain.  °· Many cases can be observed and treated at home. °· Over-the-counter medicines recommended by your caregiver. °· Prescription medicine. °· Antibiotics, for infection. °· Birth control pills, for painful periods or for ovulation pain. °· Hormone treatment, for endometriosis. °· Nerve blocking injections. °· Physical therapy. °· Antidepressants. °· Counseling with a psychologist or psychiatrist. °· Minor or major surgery. °HOME CARE INSTRUCTIONS  °· Do not take laxatives, unless directed by your caregiver. °· Take over-the-counter pain medicine only if ordered by your caregiver. Do not take aspirin because it can cause an upset stomach or bleeding. °· Try a clear liquid diet (broth or water) as ordered by your caregiver. Slowly move to a bland diet, as tolerated, if the pain is related to the stomach or intestine. °· Have a thermometer and take your temperature several times a day, and record it. °· Bed rest and sleep, if it helps the pain. °· Avoid sexual intercourse, if it causes pain. °· Avoid stressful situations. °· Keep your follow-up appointments and tests, as your caregiver orders. °· If the pain does not go away with medicine or surgery, you may try: °¨ Acupuncture. °¨ Relaxation exercises (yoga, meditation). °¨ Group therapy. °¨ Counseling. °SEEK MEDICAL CARE IF:  °· You notice certain foods cause stomach pain. °· Your home care treatment is not helping your pain. °· You need stronger pain medicine. °· You want your IUD removed. °· You feel faint or   lightheaded. °· You develop nausea and vomiting. °· You develop a rash. °· You are having side effects or an allergy to your medicine. °SEEK IMMEDIATE MEDICAL CARE IF:  °· Your  pain does not go away or gets worse. °· You have a fever. °· Your pain is felt only in portions of the abdomen. The right side could possibly be appendicitis. The left lower portion of the abdomen could be colitis or diverticulitis. °· You are passing blood in your stools (bright red or black tarry stools, with or without vomiting). °· You have blood in your urine. °· You develop chills, with or without a fever. °· You pass out. °MAKE SURE YOU:  °· Understand these instructions. °· Will watch your condition. °· Will get help right away if you are not doing well or get worse. °Document Released: 12/30/2006 Document Revised: 07/19/2013 Document Reviewed: 01/19/2009 °ExitCare® Patient Information ©2015 [employer]. This information is not intended to replace advice given to you by your health care provider. Make sure you discuss any questions you have with your health care provider. ° °

## 2014-01-11 NOTE — ED Notes (Signed)
Pt reports lower abdominal and back pain x 5 days with increase urinary frequency. Pt recently treated for a UTI x 1 month ago and finished course of abx. Denies n/v/d. Denies burning with urination. No vaginal problems or spotting.

## 2014-01-12 LAB — GC/CHLAMYDIA PROBE AMP
CT Probe RNA: NEGATIVE
GC Probe RNA: POSITIVE — AB

## 2014-01-13 ENCOUNTER — Telehealth (HOSPITAL_BASED_OUTPATIENT_CLINIC_OR_DEPARTMENT_OTHER): Payer: Self-pay | Admitting: Emergency Medicine

## 2014-01-13 NOTE — Telephone Encounter (Signed)
  Post ED Visit - Positive Culture Follow-up: Chart Hand-off to ED Flow Manager  Culture assessed and recommendations reviewed by: []  Wes Dulaney, Pharm.D., BCPS []  Celedonio MiyamotoJeremy Frens, Pharm.D., BCPS []  Georgina PillionElizabeth Martin, [address]Pharm[address]D., BCPS []  Clary[place]villeMinh Pham, [place]D., BCPS, AAHIVP []  Estella HuskMichelle Turner, Pharm .D., BCPS, AAHIVP []  Red ChristiansSamson Lee, Pharm.D. []  Cassie FarmingtonStewart, [place]arm.D.  Positive gonorrhea culture  [x]  Patient discharged without antimicrobial prescription and treatment is now indicated []  Organism is resistant to prescribed ED discharge antimicrobial []  Patient with positive blood cultures  Changes discussed with ED provider: sent to EDP 01/13/14 New antibiotic prescription    Berle MullMiller, Maximilliano Kersh 01/13/2014, 12:14 PM

## 2014-01-14 ENCOUNTER — Telehealth (HOSPITAL_COMMUNITY): Payer: Self-pay

## 2014-01-14 NOTE — ED Provider Notes (Signed)
Medical screening examination/treatment/procedure(s) were performed by non-physician practitioner and as supervising physician I was immediately available for consultation/collaboration.   EKG Interpretation None       Meggen Spaziani, MD 01/14/14 1546 

## 2014-01-14 NOTE — ED Notes (Signed)
Chart returned from MD office. Pt needs Cifixime 400mg  po x 1 and Azithromycin 2 grams x1 po. Orders written by Ellin Sabaiffany Green PA

## 2014-04-06 IMAGING — CT CT ANGIO CHEST
2 of 7 series · 19 of 46 positions shown · IV contrast (OMNIPAQUE 300)
Comparison: None.

CLINICAL DATA: Chest pain for 3 weeks, elevated d-dimer

CT ANGIOGRAPHY CHEST
TECHNIQUE: Multidetector CT imaging of the chest using the
standard protocol during bolus administration of intravenous
contrast. Multiplanar reconstructed images including MIPs were
obtained and reviewed to evaluate the vascular anatomy.
Contrast: 100mL OMNIPAQUE IOHEXOL 350 MG/ML SOLN

[Series 9: thins for pacs · axial · 0.68mm/px · z∈[+1674,+1878]mm · 16 of 230 slices shown]
[im 13/230  lung]
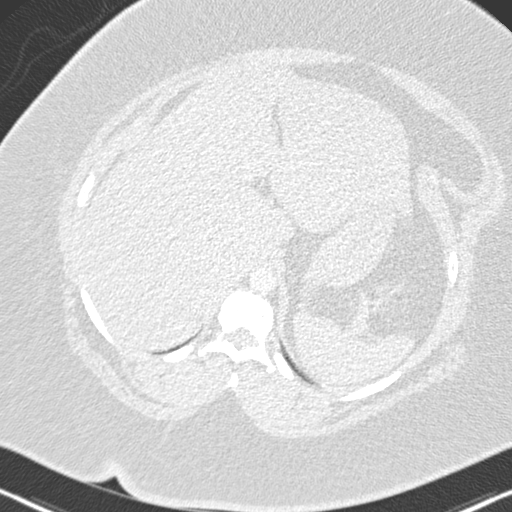
[im 26/230  soft-tissue]
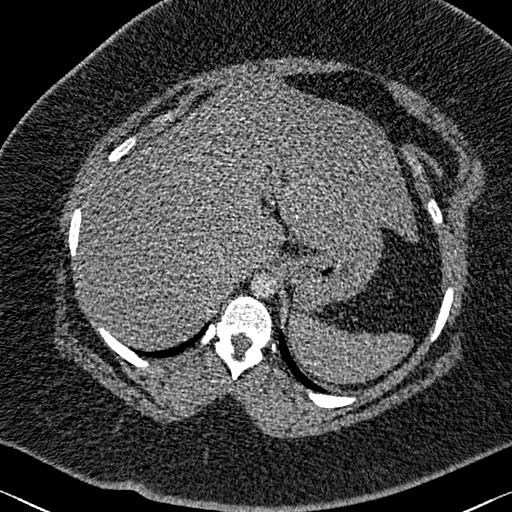
[im 39/230  lung]
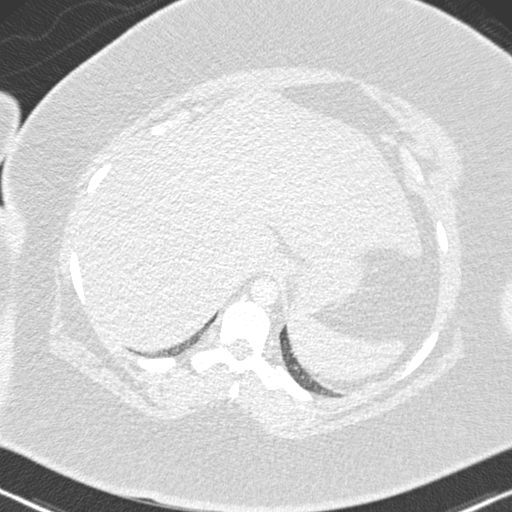
[im 51/230  soft-tissue]
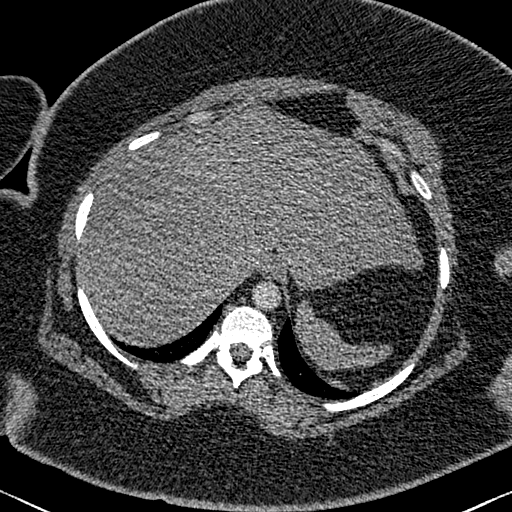
[im 64/230  lung]
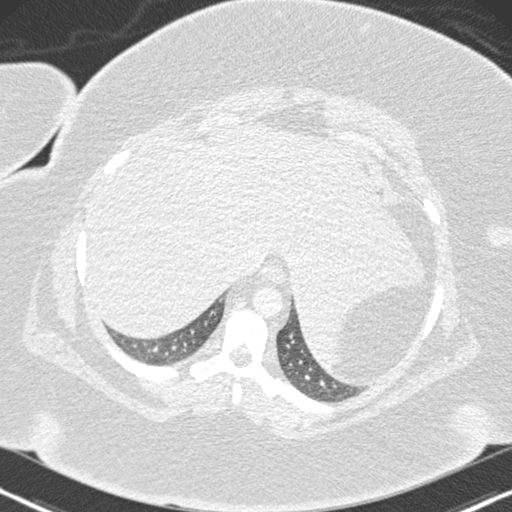
[im 77/230  soft-tissue]
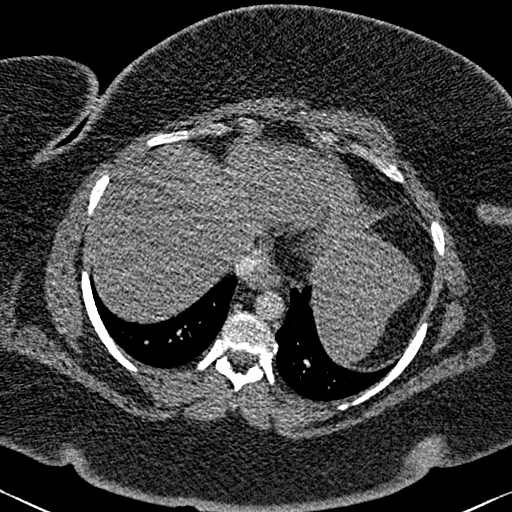
[im 90/230  lung]
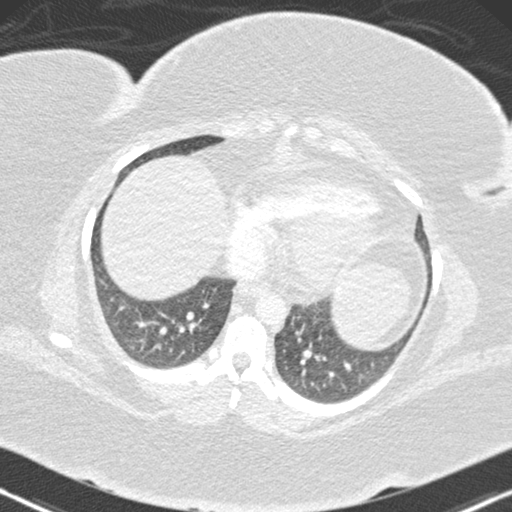
[im 102/230  soft-tissue]
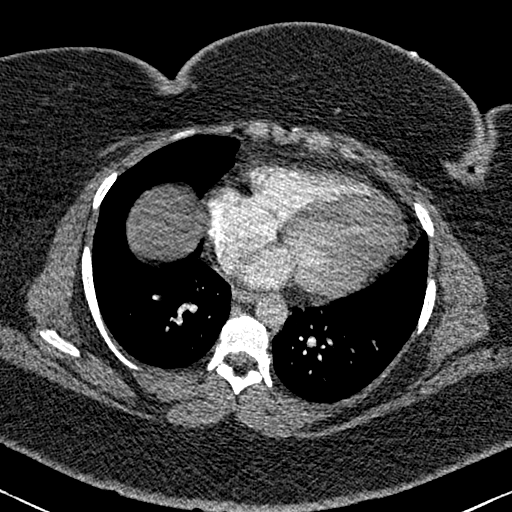
[im 128/230  lung]
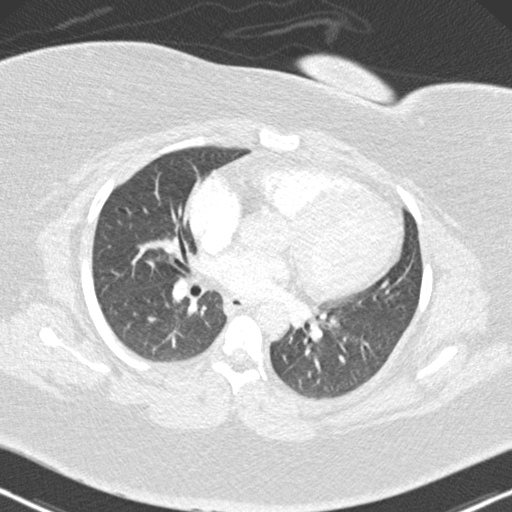
[im 140/230  soft-tissue]
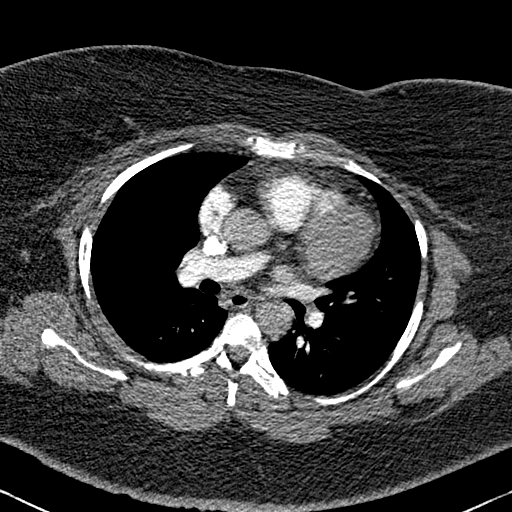
[im 153/230  lung]
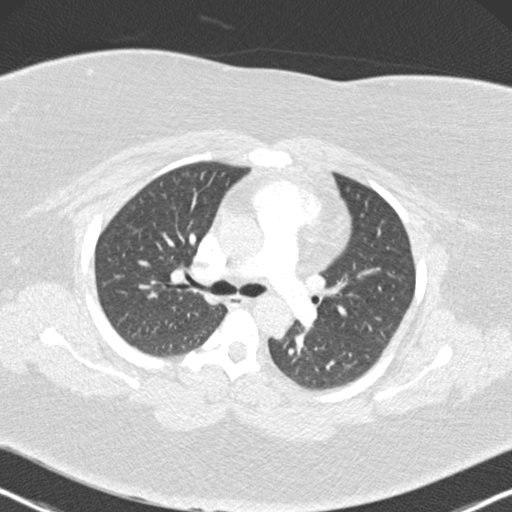
[im 166/230  soft-tissue]
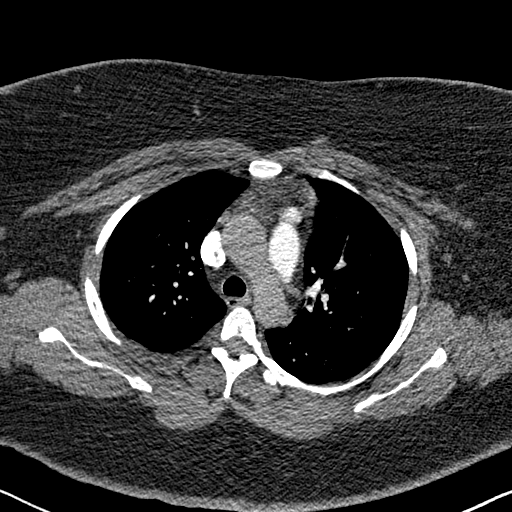
[im 179/230  lung]
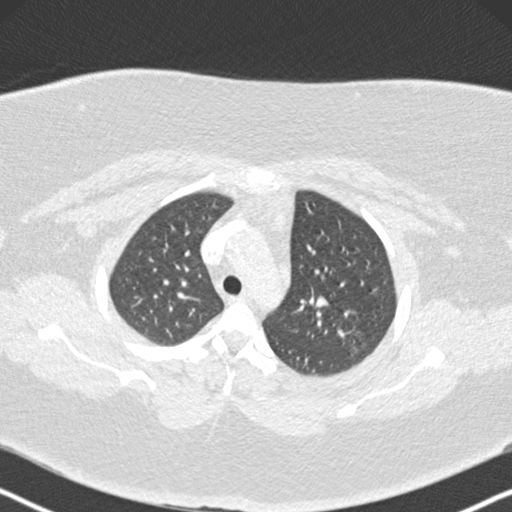
[im 191/230  soft-tissue]
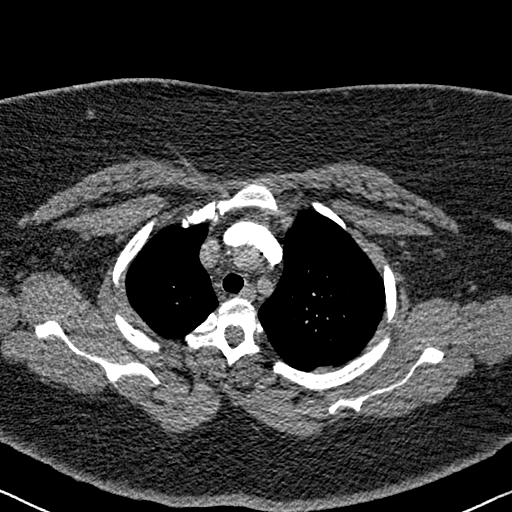
[im 204/230  lung]
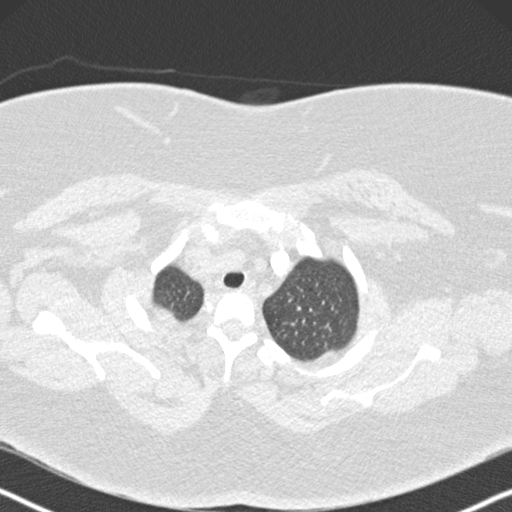
[im 217/230  soft-tissue]
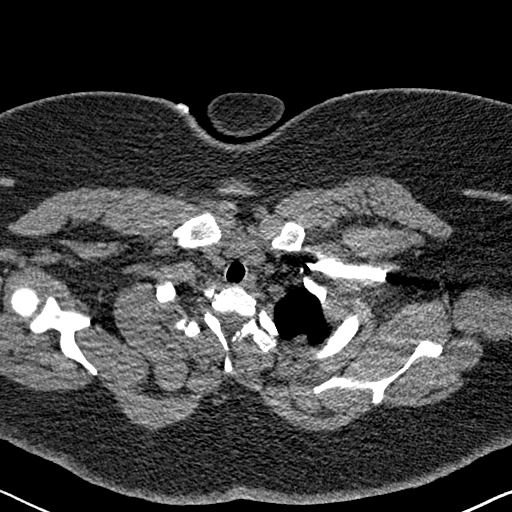

[Series 602: <mpr thick range> · coronal · 0.68mm/px · 3 of 170 slices shown]
[im 43/170  soft-tissue]
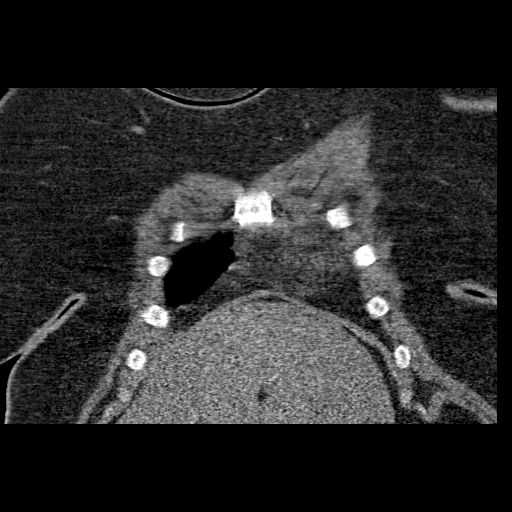
[im 85/170  soft-tissue]
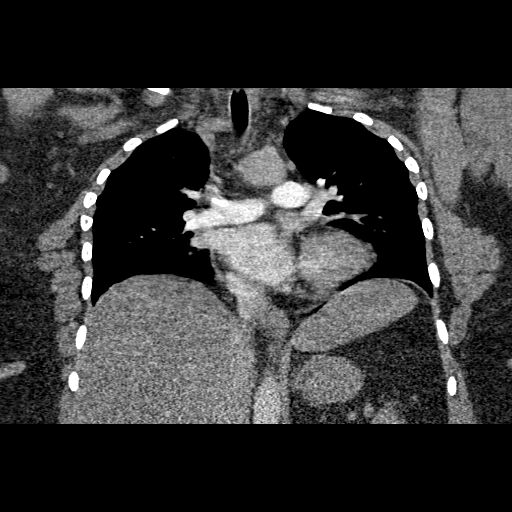
[im 127/170  soft-tissue]
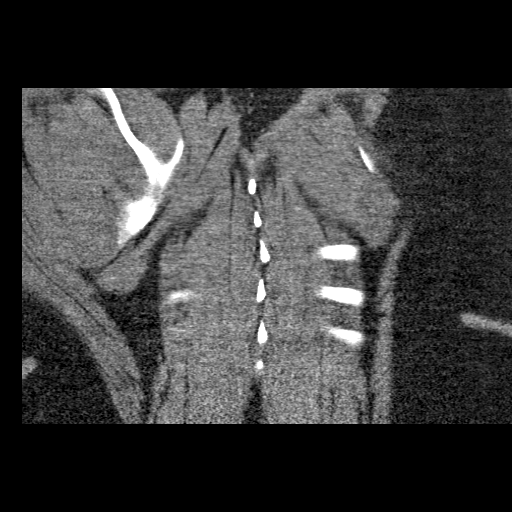

[19 of 46 positions shown; findings below may reference images not displayed]

FINDINGS: Aorta normal caliber.
Degradation of image quality secondary to body habitus with beam
hardening.
Central pulmonary arteries appear grossly patent.
Numerous areas of inhomogeneity are seen within pulmonary arterial
branches particularly in the lower lobes, potentially related to
beam hardening artifacts but making it impossible to exclude small
and peripheral pulmonary emboli.
Visualized portion of upper abdomen unremarkable.
Question residual thymic tissue and a few small lymph nodes at
anterior mediastinum.
No definite enlarged thoracic nodes identified.
Lungs clear.
No pleural effusion, pneumothorax, or acute osseous findings.
IMPRESSION: No gross central pulmonary emboli identified.
However, due to degradation of image quality and scattered
artifacts as result of body habitus, unable to exclude small and
peripheral pulmonary emboli particularly in the lower lobes as
above.

## 2014-04-13 ENCOUNTER — Encounter (HOSPITAL_COMMUNITY): Payer: Self-pay | Admitting: Emergency Medicine

## 2014-04-13 ENCOUNTER — Emergency Department (HOSPITAL_COMMUNITY)
Admission: EM | Admit: 2014-04-13 | Discharge: 2014-04-14 | Disposition: A | Discharge status: Home / Self Care | Payer: Medicaid Other | Attending: Emergency Medicine | Admitting: Emergency Medicine

## 2014-04-13 DIAGNOSIS — R350 Frequency of micturition: Secondary | ICD-10-CM | POA: Insufficient documentation

## 2014-04-13 DIAGNOSIS — N739 Female pelvic inflammatory disease, unspecified: Secondary | ICD-10-CM | POA: Insufficient documentation

## 2014-04-13 DIAGNOSIS — N730 Acute parametritis and pelvic cellulitis: Secondary | ICD-10-CM

## 2014-04-13 DIAGNOSIS — Z872 Personal history of diseases of the skin and subcutaneous tissue: Secondary | ICD-10-CM | POA: Insufficient documentation

## 2014-04-13 DIAGNOSIS — I10 Essential (primary) hypertension: Secondary | ICD-10-CM | POA: Diagnosis not present

## 2014-04-13 DIAGNOSIS — Z3202 Encounter for pregnancy test, result negative: Secondary | ICD-10-CM | POA: Diagnosis not present

## 2014-04-13 DIAGNOSIS — R102 Pelvic and perineal pain: Secondary | ICD-10-CM

## 2014-04-13 DIAGNOSIS — N898 Other specified noninflammatory disorders of vagina: Secondary | ICD-10-CM | POA: Diagnosis present

## 2014-04-13 DIAGNOSIS — J029 Acute pharyngitis, unspecified: Secondary | ICD-10-CM | POA: Insufficient documentation

## 2014-04-13 DIAGNOSIS — Z8619 Personal history of other infectious and parasitic diseases: Secondary | ICD-10-CM | POA: Insufficient documentation

## 2014-04-13 DIAGNOSIS — Z87440 Personal history of urinary (tract) infections: Secondary | ICD-10-CM | POA: Diagnosis not present

## 2014-04-13 DIAGNOSIS — E669 Obesity, unspecified: Secondary | ICD-10-CM | POA: Insufficient documentation

## 2014-04-13 DIAGNOSIS — Z79899 Other long term (current) drug therapy: Secondary | ICD-10-CM | POA: Diagnosis not present

## 2014-04-13 HISTORY — DX: Gonococcal infection, unspecified: A54.9

## 2014-04-13 LAB — POC URINE PREG, ED: Preg Test, Ur: NEGATIVE

## 2014-04-13 LAB — URINALYSIS, ROUTINE W REFLEX MICROSCOPIC
Bilirubin Urine: NEGATIVE
Glucose, UA: NEGATIVE mg/dL
Ketones, ur: NEGATIVE mg/dL
Nitrite: NEGATIVE
PROTEIN: NEGATIVE mg/dL
SPECIFIC GRAVITY, URINE: 1.036 — AB (ref 1.005–1.030)
Urobilinogen, UA: 0.2 mg/dL (ref 0.0–1.0)
pH: 5.5 (ref 5.0–8.0)

## 2014-04-13 LAB — URINE MICROSCOPIC-ADD ON

## 2014-04-13 NOTE — ED Notes (Signed)
Pt reports abdominal pain (mid and right lower) for "a few weeks." Denies diarrhea and fever. Was seen here recently and diagnosed with gonorrhea. Took all of the antibiotics as prescribed. Reports urinary frequency and "tingling" when she urinates. Reports during sex, "it starts to burn more and more as time goes on." No other complaints/concerns.

## 2014-04-14 ENCOUNTER — Emergency Department (HOSPITAL_COMMUNITY): Payer: Medicaid Other

## 2014-04-14 LAB — WET PREP, GENITAL: Trich, Wet Prep: NONE SEEN

## 2014-04-14 LAB — D-DIMER, QUANTITATIVE (NOT AT ARMC): D-Dimer, Quant: <0.27 ug/mL-FEU (ref 0.00–0.48)

## 2014-04-14 LAB — GC/CHLAMYDIA PROBE AMP (~~LOC~~) NOT AT ARMC
Chlamydia: NEGATIVE
Neisseria Gonorrhea: NEGATIVE

## 2014-04-14 MED ORDER — METRONIDAZOLE 500 MG PO TABS: 500.0000 mg | ORAL_TABLET | Freq: Two times a day (BID) | ORAL | Status: DC

## 2014-04-14 MED ORDER — FLUCONAZOLE 150 MG PO TABS
150.0000 mg | ORAL_TABLET | Freq: Once | ORAL | Status: AC
  Administered 2014-04-14: 150 mg via ORAL
  Filled 2014-04-14: qty 1

## 2014-04-14 MED ORDER — LIDOCAINE HCL (PF) 1 % IJ SOLN
INTRAMUSCULAR | Status: AC
  Administered 2014-04-14: 02:00:00
  Filled 2014-04-14: qty 5

## 2014-04-14 MED ORDER — OXYCODONE-ACETAMINOPHEN 5-325 MG PO TABS
1.0000 | ORAL_TABLET | Freq: Once | ORAL | Status: AC
  Administered 2014-04-14: 1 via ORAL
  Filled 2014-04-14: qty 1

## 2014-04-14 MED ORDER — HYDROCODONE-ACETAMINOPHEN 5-325 MG PO TABS: 1.0000 | ORAL_TABLET | Freq: Four times a day (QID) | ORAL | Status: DC | PRN

## 2014-04-14 MED ORDER — CEFTRIAXONE SODIUM 250 MG IJ SOLR
250.0000 mg | Freq: Once | INTRAMUSCULAR | Status: AC
  Administered 2014-04-14: 250 mg via INTRAMUSCULAR
  Filled 2014-04-14: qty 250

## 2014-04-14 MED ORDER — DOXYCYCLINE HYCLATE 100 MG PO CAPS: 100.0000 mg | ORAL_CAPSULE | Freq: Two times a day (BID) | ORAL | Status: DC

## 2014-04-14 NOTE — ED Provider Notes (Signed)
[id number] - [facility] PA[facility] at shift change. Patient with ultrasound pending. D-dimer was ordered accidentally. Plan discussed with Lawyer, PA-C which includes discharge if ultrasound is negative. Ultrasound today shows no acute findings. Patient has been treated in the ED for PID. Her dimer is also negative today. She is stable for discharge at this time with necessary return precautions.  Antony MaduraKelly Wanita Derenzo, PA-C 04/14/14 [id number]  Olivia Mackielga M Otter, MD 04/14/14 [phone]

## 2014-04-14 NOTE — ED Provider Notes (Signed)
CSN: [id number]     Arrival date & time 04/13/14  2158 History   First MD Initiated Contact with Patient 04/13/14 2308     Chief Complaint  Patient presents with  . Urinary Frequency  . Abdominal Pain  . Nausea     (Consider location/radiation/quality/duration/timing/severity/associated sxs/prior Treatment) Patient is a 29 y.o. female presenting with frequency, abdominal pain, and vaginal discharge. The history is provided by the patient.  Urinary Frequency This is a new problem. Associated symptoms include abdominal pain, a sore throat and urinary symptoms. Pertinent negatives include no anorexia, chest pain, chills, congestion, coughing, diaphoresis, fatigue, fever, headaches, myalgias, nausea, neck pain, rash, swollen glands, vertigo, visual change, vomiting or weakness. Nothing aggravates the symptoms. She has tried nothing for the symptoms.  Abdominal Pain Associated symptoms: sore throat and vaginal discharge   Associated symptoms: no anorexia, no chest pain, no chills, no cough, no fatigue, no fever, no nausea and no vomiting   Vaginal Discharge Quality:  Thick and white Severity:  Moderate Onset quality:  Gradual Duration:  2 weeks Timing:  Constant Progression:  Unchanged Chronicity:  New Relieved by:  Nothing Worsened by:  Nothing tried Ineffective treatments:  None tried Associated symptoms: abdominal pain   Associated symptoms: no fever, no genital lesions, no nausea, no rash, no urinary frequency, no urinary hesitancy, no urinary incontinence and no vomiting     Past Medical History  Diagnosis Date  . Hypertension   . Urinary tract infection   . Abscess   . Obesity   . Chest pain   . Gonorrhea    Past Surgical History  Procedure Laterality Date  . Cesarean section    . Tonsillectomy     Family History  Problem Relation Age of Onset  . Hypertension Mother   . Diabetes Mother   . Diabetes Other   . Hypertension Other    History  Substance Use Topics   . Smoking status: Never Smoker   . Smokeless tobacco: Not on file  . Alcohol Use: No   OB History    No data available     Review of Systems  Constitutional: Negative for fever, chills, diaphoresis and fatigue.  HENT: Positive for sore throat. Negative for congestion.   Respiratory: Negative for cough.   Cardiovascular: Negative for chest pain.  Gastrointestinal: Positive for abdominal pain. Negative for nausea, vomiting and anorexia.  Genitourinary: Positive for frequency and vaginal discharge. Negative for bladder incontinence and hesitancy.  Musculoskeletal: Negative for myalgias and neck pain.  Skin: Negative for rash.  Neurological: Negative for vertigo, weakness and headaches.      Allergies  Review of patient's allergies indicates no known allergies.  Home Medications   Prior to Admission medications   Medication Sig Start Date End Date Taking? Authorizing Provider  Aspirin-Acetaminophen 500-325 MG PACK Take 1 each by mouth every 6 (six) hours as needed (pain).   Yes Historical Provider, MD  ibuprofen (ADVIL,MOTRIN) 800 MG tablet Take 800 mg by mouth every 6 (six) hours as needed for moderate pain.   Yes Historical Provider, MD  oxyCODONE-acetaminophen (PERCOCET/ROXICET) 5-325 MG per tablet Take 1 tablet by mouth every 6 (six) hours as needed for moderate pain or severe pain. Patient not taking: Reported on 04/13/2014 01/11/14   Mora BellmanHannah S Merrell, PA-C  promethazine (PHENERGAN) 25 MG tablet Take 1 tablet (25 mg total) by mouth every 6 (six) hours as needed for nausea or vomiting. Patient not taking: Reported on 04/13/2014 01/11/14   Ramon DredgeHannah S  Merrell, PA-C   BP 147/98 mmHg  Pulse 106  Temp(Src) 98 F (36.7 C) (Oral)  Resp 20  Ht  (1.575 m)  Wt 280 lb (127.007 kg)  BMI 51.20 kg/m2  SpO2 100%  LMP 03/30/2014 (Approximate) Physical Exam  Constitutional: She is oriented to person, place, and time. She appears well-developed and well-nourished. No distress.  HENT:   Head: Normocephalic and atraumatic.  Mouth/Throat: Oropharynx is clear and moist.  Eyes: Pupils are equal, round, and reactive to light.  Neck: Normal range of motion. Neck supple.  Cardiovascular: Normal rate, regular rhythm and normal heart sounds.  Exam reveals no gallop and no friction rub.   No murmur heard. Pulmonary/Chest: Effort normal and breath sounds normal.  Abdominal: Soft. Bowel sounds are normal. There is tenderness.  Genitourinary: Uterus normal. Cervix exhibits motion tenderness and discharge. Right adnexum displays tenderness. Right adnexum displays no fullness. Left adnexum displays tenderness. Left adnexum displays no fullness. Vaginal discharge found.  Neurological: She is alert and oriented to person, place, and time. She exhibits normal muscle tone. Coordination normal.  Skin: Skin is warm and dry. No rash noted. No erythema.  Nursing note and vitals reviewed.   ED Course  Procedures (including critical care time) Labs Review Labs Reviewed  WET PREP, GENITAL - Abnormal; Notable for the following:    Yeast Wet Prep HPF POC MANY (*)    Clue Cells Wet Prep HPF POC MANY (*)    WBC, Wet Prep HPF POC MANY (*)    All other components within normal limits  URINALYSIS, ROUTINE W REFLEX MICROSCOPIC - Abnormal; Notable for the following:    APPearance CLOUDY (*)    Specific Gravity, Urine 1.036 (*)    Hgb urine dipstick TRACE (*)    Leukocytes, UA SMALL (*)    All other components within normal limits  URINE MICROSCOPIC-ADD ON - Abnormal; Notable for the following:    Squamous Epithelial / LPF MANY (*)    Bacteria, UA MANY (*)    All other components within normal limits  D-DIMER, QUANTITATIVE  POC URINE PREG, ED  GC/CHLAMYDIA PROBE AMP ([place])    Imaging Review No results found.   The patient will be treated for PID based on her symptoms and previous history of STD.  The patient is advised to return here as needed.  Also given follow-up with [facility] patient does not have any ovarian torsion or TOA noted on ultrasound MDM   Final diagnoses:  Pelvic pain in female       Carlyle Dolly, PA-C 04/16/14 1478  Olivia Mackie, MD 04/16/14 [phone]

## 2014-04-14 NOTE — Discharge Instructions (Signed)
Return here as needed.  Follow-up with a primary care doctor  Pelvic Inflammatory Disease Pelvic inflammatory disease (PID) refers to an infection in some or all of the female organs. The infection can be in the uterus, ovaries, fallopian tubes, or the surrounding tissues in the pelvis. PID can cause abdominal or pelvic pain that comes on suddenly (acute pelvic pain). PID is a serious infection because it can lead to lasting (chronic) pelvic pain or the inability to have children (infertile).  CAUSES  The infection is often caused by the normal bacteria found in the vaginal tissues. PID may also be caused by an infection that is spread during sexual contact. PID can also occur following:   The birth of a baby.   A miscarriage.   An abortion.   Major pelvic surgery.   The use of an intrauterine device (IUD).   A sexual assault.  RISK FACTORS Certain factors can put a person at higher risk for PID, such as:  Being younger than 25 years.  Being sexually active at [place] age.  Usingnonbarrier contraception.  Havingmultiple sexual partners.  Having sex with someone who has symptoms of a genital infection.  Using oral contraception. Other times, certain behaviors can increase the possibility of getting PID, such as:  Having sex during your period.  Using a vaginal douche.  Having an intrauterine device (IUD) in place. SYMPTOMS   Abdominal or pelvic pain.   Fever.   Chills.   Abnormal vaginal discharge.  Abnormal uterine bleeding.   Unusual pain shortly after finishing your period. DIAGNOSIS  Your caregiver will choose some of the following methods to make a diagnosis, such as:   Performinga physical exam and history. A pelvic exam typically reveals a very tender uterus and surrounding pelvis.   Ordering laboratory tests including a pregnancy test, blood tests, and urine test.  Orderingcultures of the vagina and cervix to check for a sexually  transmitted infection (STI).  Performing an ultrasound.   Performing a laparoscopic procedure to look inside the pelvis.  TREATMENT   Antibiotic medicines may be prescribed and taken by mouth.   Sexual partners may be treated when the infection is caused by a sexually transmitted disease (STD).   Hospitalization may be needed to give antibiotics intravenously.  Surgery may be needed, but this is rare. It may take weeks until you are completely well. If you are diagnosed with PID, you should also be checked for human immunodeficiency virus (HIV). HOME CARE INSTRUCTIONS   If given, take your antibiotics as directed. Finish the medicine even if you start to feel better.   Only take over-the-counter or prescription medicines for pain, discomfort, or fever as directed by your caregiver.   Do not have sexual intercourse until treatment is completed or as directed by your caregiver. If PID is confirmed, your recent sexual partner(s) will need treatment.   Keep your follow-up appointments. SEEK MEDICAL CARE IF:   You have increased or abnormal vaginal discharge.   You need prescription medicine for your pain.   You vomit.   You cannot take your medicines.   Your partner has an STD.  SEEK IMMEDIATE MEDICAL CARE IF:   You have a fever.   You have increased abdominal or pelvic pain.   You have chills.   You have pain when you urinate.   You are not better after 72 hours following treatment.  MAKE SURE YOU:   Understand these instructions.  Will watch your condition.  Will get  help right away if you are not doing well or get worse. Document Released: 03/04/2005 Document Revised: 06/29/2012 Document Reviewed: 02/28/2011 [facility] Patient Information 2015 [place], [place]. This information is not intended to replace advice given to you by your health care provider. Make sure you discuss any questions you have with your health care provider.

## 2017-04-23 ENCOUNTER — Encounter (HOSPITAL_COMMUNITY): Payer: Self-pay | Admitting: *Deleted

## 2017-04-23 ENCOUNTER — Emergency Department (HOSPITAL_COMMUNITY)
Admission: EM | Admit: 2017-04-23 | Discharge: 2017-04-23 | Disposition: A | Discharge status: Home / Self Care | Payer: Medicaid Other | Attending: Emergency Medicine | Admitting: Emergency Medicine

## 2017-04-23 ENCOUNTER — Emergency Department (HOSPITAL_COMMUNITY): Payer: Medicaid Other

## 2017-04-23 DIAGNOSIS — R109 Unspecified abdominal pain: Secondary | ICD-10-CM | POA: Diagnosis present

## 2017-04-23 DIAGNOSIS — I10 Essential (primary) hypertension: Secondary | ICD-10-CM | POA: Insufficient documentation

## 2017-04-23 DIAGNOSIS — Z79899 Other long term (current) drug therapy: Secondary | ICD-10-CM | POA: Diagnosis not present

## 2017-04-23 DIAGNOSIS — R1011 Right upper quadrant pain: Secondary | ICD-10-CM | POA: Diagnosis not present

## 2017-04-23 LAB — CBC
HEMATOCRIT: 34.3 % — AB (ref 36.0–46.0)
Hemoglobin: 10.2 g/dL — ABNORMAL LOW (ref 12.0–15.0)
MCH: 23.6 pg — ABNORMAL LOW (ref 26.0–34.0)
MCHC: 29.7 g/dL — AB (ref 30.0–36.0)
MCV: 79.2 fL (ref 78.0–100.0)
Platelets: 418 10*3/uL — ABNORMAL HIGH (ref 150–400)
RBC: 4.33 MIL/uL (ref 3.87–5.11)
RDW: 15.9 % — ABNORMAL HIGH (ref 11.5–15.5)
WBC: 10.1 10*3/uL (ref 4.0–10.5)

## 2017-04-23 LAB — COMPREHENSIVE METABOLIC PANEL
ALT: 19 U/L (ref 14–54)
AST: 18 U/L (ref 15–41)
Albumin: 4.1 g/dL (ref 3.5–5.0)
Alkaline Phosphatase: 53 U/L (ref 38–126)
Anion gap: 8 (ref 5–15)
BUN: 14 mg/dL (ref 6–20)
CO2: 23 mmol/L (ref 22–32)
Calcium: 9.3 mg/dL (ref 8.9–10.3)
Chloride: 108 mmol/L (ref 101–111)
Creatinine, Ser: 0.59 mg/dL (ref 0.44–1.00)
GFR calc non Af Amer: >60 mL/min (ref >60)
Glucose, Bld: 138 mg/dL — ABNORMAL HIGH (ref 65–99)
POTASSIUM: 3.8 mmol/L (ref 3.5–5.1)
SODIUM: 139 mmol/L (ref 135–145)
TOTAL PROTEIN: 7.9 g/dL (ref 6.5–8.1)
Total Bilirubin: 0.5 mg/dL (ref 0.3–1.2)

## 2017-04-23 LAB — URINALYSIS, ROUTINE W REFLEX MICROSCOPIC
BILIRUBIN URINE: NEGATIVE
Glucose, UA: NEGATIVE mg/dL
Hgb urine dipstick: NEGATIVE
Ketones, ur: NEGATIVE mg/dL
Leukocytes, UA: NEGATIVE
NITRITE: NEGATIVE
PH: 5 (ref 5.0–8.0)
Protein, ur: NEGATIVE mg/dL
SPECIFIC GRAVITY, URINE: 1.031 — AB (ref 1.005–1.030)

## 2017-04-23 LAB — LIPASE, BLOOD: Lipase: 62 U/L — ABNORMAL HIGH (ref 11–51)

## 2017-04-23 LAB — PREGNANCY, URINE: PREG TEST UR: NEGATIVE

## 2017-04-23 MED ORDER — PANTOPRAZOLE SODIUM 20 MG PO TBEC: 20.0000 mg | DELAYED_RELEASE_TABLET | Freq: Two times a day (BID) | ORAL | 0 refills | Status: DC

## 2017-04-23 MED ORDER — GI COCKTAIL ~~LOC~~
30.0000 mL | Freq: Once | ORAL | Status: AC
  Administered 2017-04-23: 30 mL via ORAL
  Filled 2017-04-23: qty 30

## 2017-04-23 NOTE — ED Triage Notes (Signed)
Pt complains of upper and lower abdominal pain for several days. Pt denies emesis or diarrhea. Pt states her urine smells stronger than normal.

## 2017-04-23 NOTE — ED Provider Notes (Signed)
[facility]-EMERGENCY DEPT Provider Note   CSN: [id number] Arrival date & time: 04/23/17  7829     History   Chief Complaint Chief Complaint  Patient presents with  . Abdominal Pain    HPI Vanessa Morton is a 32 y.o. female.  The history is provided by the patient and medical records. No language interpreter was used.  Abdominal Pain   Associated symptoms include nausea. Pertinent negatives include diarrhea and vomiting.   Vanessa Morton is a 32 y.o. female  with a PMH of HTN who presents to the Emergency Department complaining of intermittent upper abdominal pain for approximately 6-8 months.  She reports pain started over the summer.  Pain is worse after eating.  She has noticed if she starts "eating light things" that it is not as bad. She will have some days where she will experience no pain, and others where she will experience pain after any meal.  He reports being seen a few months ago, but no record of this in the chart review.  She reports plan was for her to receive an ultrasound of her gallbladder as an outpatient, however her Medicaid has not approved for this she is having trouble getting the imaging done.  She is now experiencing pain more frequently.  She denies nausea at this time, but has had nausea intermittently throughout the last several months.  No vomiting.  No fever or chills.  No back pain, diarrhea, constipation or blood in the stool.  No urinary symptoms, however does note her urine has a stronger odor than usual.  Denies any vaginal discharge.  No medications taken prior to arrival for symptoms.  Past Medical History:  Diagnosis Date  . Abscess   . Chest pain   . Gonorrhea   . Hypertension   . Obesity   . Urinary tract infection     There are no active problems to display for this patient.   Past Surgical History:  Procedure Laterality Date  . CESAREAN SECTION    . TONSILLECTOMY      OB History    No data available        Home Medications    Prior to Admission medications   Medication Sig Start Date End Date Taking? Authorizing Provider  Aspirin-Acetaminophen 500-325 MG PACK Take 1 each by mouth every 6 (six) hours as needed (pain).   Yes [provider]  ibuprofen (ADVIL,MOTRIN) 200 MG tablet Take 600 mg by mouth daily as needed for moderate pain.    Yes [provider]  doxycycline (VIBRAMYCIN) 100 MG capsule Take 1 capsule (100 mg total) by mouth 2 (two) times daily. Patient not taking: Reported on 04/23/2017 04/14/14   Charlestine Night, PA-C  HYDROcodone-acetaminophen (NORCO/VICODIN) 5-325 MG per tablet Take 1 tablet by mouth every 6 (six) hours as needed for moderate pain. Patient not taking: Reported on 04/23/2017 04/14/14   Charlestine Night, PA-C  metroNIDAZOLE (FLAGYL) 500 MG tablet Take 1 tablet (500 mg total) by mouth 2 (two) times daily. Patient not taking: Reported on 04/23/2017 04/14/14   Charlestine Night, PA-C  oxyCODONE-acetaminophen (PERCOCET/ROXICET) 5-325 MG per tablet Take 1 tablet by mouth every 6 (six) hours as needed for moderate pain or severe pain. Patient not taking: Reported on 04/13/2014 01/11/14   Junious Silk, PA-C  pantoprazole (PROTONIX) 20 MG tablet Take 1 tablet (20 mg total) by mouth 2 (two) times daily. 04/23/17   Devorah Givhan, Chase Picket, PA-C  promethazine (PHENERGAN) 25 MG tablet Take  1 tablet (25 mg total) by mouth every 6 (six) hours as needed for nausea or vomiting. Patient not taking: Reported on 04/13/2014 01/11/14   Junious SilkMerrell, Hannah, PA-C    Family History Family History  Problem Relation Age of Onset  . Hypertension Mother   . Diabetes Mother   . Diabetes Other   . Hypertension Other     Social History Social History   Tobacco Use  . Smoking status: Never Smoker  . Smokeless tobacco: Never Used  Substance Use Topics  . Alcohol use: No  . Drug use: No     Allergies   Patient has no known allergies.   Review of Systems Review of  Systems  Gastrointestinal: Positive for abdominal pain and nausea. Negative for blood in stool, diarrhea and vomiting.  All other systems reviewed and are negative.    Physical Exam Updated Vital Signs BP (!) 148/102 (BP Location: Left Wrist)   Pulse 75   Temp 98.2 F (36.8 C)   Resp 18   LMP 03/23/2017   SpO2 98%   Physical Exam  Constitutional: She is oriented to person, place, and time. She appears well-developed and well-nourished. No distress.  Well appearing.   HENT:  Head: Normocephalic and atraumatic.  Neck: Neck supple.  Cardiovascular: Normal rate, regular rhythm and normal heart sounds.  No murmur heard. Pulmonary/Chest: Effort normal and breath sounds normal. No respiratory distress.  Abdominal: Soft. Bowel sounds are normal. She exhibits no distension.  Tenderness palpation of the epigastrium and right upper quadrant. No rebound or guarding.  Neurological: She is alert and oriented to person, place, and time.  Skin: Skin is warm and dry.  Nursing note and vitals reviewed.    ED Treatments / Results  Labs (all labs ordered are listed, but only abnormal results are displayed) Labs Reviewed  LIPASE, BLOOD - Abnormal; Notable for the following components:      Result Value   Lipase 62 (*)    All other components within normal limits  COMPREHENSIVE METABOLIC PANEL - Abnormal; Notable for the following components:   Glucose, Bld 138 (*)    All other components within normal limits  CBC - Abnormal; Notable for the following components:   Hemoglobin 10.2 (*)    HCT 34.3 (*)    MCH 23.6 (*)    MCHC 29.7 (*)    RDW 15.9 (*)    Platelets 418 (*)    All other components within normal limits  URINALYSIS, ROUTINE W REFLEX MICROSCOPIC - Abnormal; Notable for the following components:   APPearance HAZY (*)    Specific Gravity, Urine 1.031 (*)    All other components within normal limits  PREGNANCY, URINE    EKG  EKG Interpretation None        Radiology [place] Abdomen Limited Ruq  Result Date: 04/23/2017 CLINICAL DATA:  32 y/o F; several days of right upper quadrant abdominal pain. EXAM: ULTRASOUND ABDOMEN LIMITED RIGHT UPPER QUADRANT COMPARISON:  None. FINDINGS: Gallbladder: No gallstones or wall thickening visualized. No sonographic Murphy sign noted by sonographer. Common bile duct: Diameter: 2.9 mm Liver: No focal lesion identified. Increased liver echogenicity. Portal vein is patent on color Doppler imaging with normal direction of blood flow towards the liver. IMPRESSION: Hepatic steatosis. Otherwise unremarkable right upper quadrant abdomen ultrasound. Electronically Signed   By: Mitzi HansenLance  Furusawa-Stratton M.D.   On: 04/23/2017 18:33    Procedures Procedures (including critical care time)  Medications Ordered in ED Medications  gi cocktail (Maalox,Lidocaine,Donnatal) (30  mLs Oral Given 04/23/17 1748)     Initial Impression / Assessment and Plan / ED Course  I have reviewed the triage vital signs and the nursing notes.  Pertinent labs & imaging results that were available during my care of the patient were reviewed by me and considered in my medical decision making (see chart for details).    Vanessa Morton is a 32 y.o. female who presents to ED for intermittent upper abdominal pain for 6-8 months. Feels pain is worse after eating. Labs reviewed with mild elevation in lipase of 62. Would expect higher value for pancreatitis and clinically with prolonged symptoms, doubt this. Remainder of labs reassuring. Baseline anemia. UA with no signs of infection or hgb. RUQ ultrasound obtained with no acute findings. Patient is nontoxic, nonseptic appearing with a non-surgical abdominal exam. On repeat exam, abdominal exam with no peritoneal signs. No indication of appendicitis, bowel obstruction, bowel perforation, cholecystitis, diverticulitis, PID or ectopic pregnancy. Patient discharged home with symptomatic treatment and encouraged to  follow up with GI. I have also discussed reasons to return immediately to the ER. Patient expresses understanding and agrees with plan as dictated above.   Final Clinical Impressions(s) / ED Diagnoses   Final diagnoses:  RUQ pain    ED Discharge Orders        Ordered    pantoprazole (PROTONIX) 20 MG tablet  2 times daily     04/23/17 1841       Kearia Yin, Chase Picket, PA-C 04/23/17 Jerene Bears    Shaune Pollack, MD 04/24/17 0225

## 2017-04-23 NOTE — Discharge Instructions (Signed)
It was my pleasure taking care of you today!   Take Protonix twice daily as directed.   Fortunately, your lab work and imaging was very reassuring today. It is important that you follow up with a GI doctor to further discuss the cause of your abdominal pain. At this time, there does not appear to be an acute, emergent cause for your abdominal pain. However, this does not mean that your abdominal pain may not become an emergency in the future. It is VERY important that you monitor your symptoms and return to the Emergency Department if you develop any of the following symptoms:  You have a fever.  You keep throwing up and can't keep fluids down. You pass bloody or black tarry stools.  There is bright red blood in the stool. You do not seem to be getting better.  You have any questions or concerns.

## 2017-05-25 ENCOUNTER — Emergency Department (HOSPITAL_COMMUNITY)
Admission: EM | Admit: 2017-05-25 | Discharge: 2017-05-25 | Disposition: A | Discharge status: Home / Self Care | Payer: Medicaid Other | Attending: Emergency Medicine | Admitting: Emergency Medicine

## 2017-05-25 ENCOUNTER — Encounter (HOSPITAL_COMMUNITY): Payer: Self-pay

## 2017-05-25 ENCOUNTER — Other Ambulatory Visit: Payer: Self-pay

## 2017-05-25 ENCOUNTER — Emergency Department (HOSPITAL_COMMUNITY): Payer: Medicaid Other

## 2017-05-25 DIAGNOSIS — I10 Essential (primary) hypertension: Secondary | ICD-10-CM | POA: Insufficient documentation

## 2017-05-25 DIAGNOSIS — R2241 Localized swelling, mass and lump, right lower limb: Secondary | ICD-10-CM | POA: Diagnosis not present

## 2017-05-25 DIAGNOSIS — D649 Anemia, unspecified: Secondary | ICD-10-CM | POA: Diagnosis not present

## 2017-05-25 DIAGNOSIS — Z79899 Other long term (current) drug therapy: Secondary | ICD-10-CM | POA: Insufficient documentation

## 2017-05-25 DIAGNOSIS — M7989 Other specified soft tissue disorders: Secondary | ICD-10-CM

## 2017-05-25 DIAGNOSIS — R2242 Localized swelling, mass and lump, left lower limb: Secondary | ICD-10-CM | POA: Diagnosis present

## 2017-05-25 LAB — COMPREHENSIVE METABOLIC PANEL
ALT: 17 U/L (ref 14–54)
AST: 20 U/L (ref 15–41)
Albumin: 3.8 g/dL (ref 3.5–5.0)
Alkaline Phosphatase: 49 U/L (ref 38–126)
Anion gap: 6 (ref 5–15)
BILIRUBIN TOTAL: 0.2 mg/dL — AB (ref 0.3–1.2)
BUN: 12 mg/dL (ref 6–20)
CO2: 25 mmol/L (ref 22–32)
Calcium: 9.3 mg/dL (ref 8.9–10.3)
Chloride: 110 mmol/L (ref 101–111)
Creatinine, Ser: 0.61 mg/dL (ref 0.44–1.00)
GFR calc Af Amer: >60 mL/min (ref >60)
GFR calc non Af Amer: >60 mL/min (ref >60)
GLUCOSE: 99 mg/dL (ref 65–99)
Potassium: 3.9 mmol/L (ref 3.5–5.1)
SODIUM: 141 mmol/L (ref 135–145)
TOTAL PROTEIN: 7.4 g/dL (ref 6.5–8.1)

## 2017-05-25 LAB — CBC WITH DIFFERENTIAL/PLATELET
BASOS PCT: 0 %
Basophils Absolute: 0 10*3/uL (ref 0.0–0.1)
Eosinophils Absolute: 0.3 10*3/uL (ref 0.0–0.7)
Eosinophils Relative: 4 %
HCT: 28.5 % — ABNORMAL LOW (ref 36.0–46.0)
Hemoglobin: 8.6 g/dL — ABNORMAL LOW (ref 12.0–15.0)
Lymphocytes Relative: 33 %
Lymphs Abs: 2.2 10*3/uL (ref 0.7–4.0)
MCH: 23.9 pg — ABNORMAL LOW (ref 26.0–34.0)
MCHC: 30.2 g/dL (ref 30.0–36.0)
MCV: 79.2 fL (ref 78.0–100.0)
MONO ABS: 0.6 10*3/uL (ref 0.1–1.0)
MONOS PCT: 9 %
Neutro Abs: 3.6 10*3/uL (ref 1.7–7.7)
Neutrophils Relative %: 54 %
Platelets: 434 10*3/uL — ABNORMAL HIGH (ref 150–400)
RBC: 3.6 MIL/uL — ABNORMAL LOW (ref 3.87–5.11)
RDW: 16 % — AB (ref 11.5–15.5)
WBC: 6.8 10*3/uL (ref 4.0–10.5)

## 2017-05-25 LAB — I-STAT CG4 LACTIC ACID, ED: Lactic Acid, Venous: 0.52 mmol/L (ref 0.5–1.9)

## 2017-05-25 LAB — I-STAT BETA HCG BLOOD, ED (MC, WL, AP ONLY)

## 2017-05-25 LAB — D-DIMER, QUANTITATIVE: D-Dimer, Quant: 0.52 ug/mL-FEU — ABNORMAL HIGH (ref 0.00–0.50)

## 2017-05-25 MED ORDER — HYDROCODONE-ACETAMINOPHEN 5-325 MG PO TABS
2.0000 | ORAL_TABLET | Freq: Once | ORAL | Status: AC
  Administered 2017-05-25: 2 via ORAL
  Filled 2017-05-25: qty 2

## 2017-05-25 MED ORDER — HYDROCODONE-ACETAMINOPHEN 5-325 MG PO TABS: 1.0000 | ORAL_TABLET | ORAL | 0 refills | Status: DC | PRN

## 2017-05-25 NOTE — Discharge Instructions (Addendum)
Review develop chest pain, shortness of breath, dizziness, or lightheadedness, return to the ER immediately.  Otherwise [facility] in the morning for an outpatient DVT study.  Will also be beneficial to get compression stockings to help with your leg swelling.  Continue to take ibuprofen and Tylenol.   IMPORTANT PATIENT INSTRUCTIONS:  You have been scheduled for an Outpatient Vascular [facility].    If tomorrow is a Saturday, Sunday or holiday, please go to the [facility] Emergency Department Registration Desk at 8 am tomorrow morning and tell them you are there for a vascular study.  If tomorrow is a weekday (Monday-Friday), please [facility] Ad[facility] at 8 am and tell them  you are  there for a vascular study.

## 2017-05-25 NOTE — ED Provider Notes (Signed)
[facility]-EMERGENCY DEPT Provider Note   CSN: [id number] Arrival date & time: 05/25/17  1723     History   Chief Complaint Chief Complaint  Patient presents with  . Foot Swelling    HPI Vanessa Morton is a 32 y.o. female.  HPI  32 year old female presents with bilateral leg pain and swelling.  The pain is primarily in the feet but her left leg is also swollen in her calf.  There has been no trauma.  The patient's pain started about 1 week ago but has progressively worsened and been much more severe over the last couple days.  She has a hard time walking due to the pain it causes on her plantar foot bilaterally.  She denies any pain past her knees or swelling past her knees.  There is no chest pain, shortness of breath, dizziness.  No known history of DVT/PE.  No recent travel/surgery.  She has been taking ibuprofen and Tylenol with no relief.  Past Medical History:  Diagnosis Date  . Abscess   . Chest pain   . Gonorrhea   . Hypertension   . Obesity   . Urinary tract infection     There are no active problems to display for this patient.   Past Surgical History:  Procedure Laterality Date  . CESAREAN SECTION    . TONSILLECTOMY      OB History    No data available       Home Medications    Prior to Admission medications   Medication Sig Start Date End Date Taking? Authorizing Provider  Aspirin-Acetaminophen 500-325 MG PACK Take 1 each by mouth every 6 (six) hours as needed (pain).    [provider]  doxycycline (VIBRAMYCIN) 100 MG capsule Take 1 capsule (100 mg total) by mouth 2 (two) times daily. Patient not taking: Reported on 04/23/2017 04/14/14   Charlestine Night, PA-C  HYDROcodone-acetaminophen (NORCO) 5-325 MG tablet Take 1-2 tablets by mouth every 4 (four) hours as needed for severe pain. 05/25/17   Pricilla Loveless, MD  ibuprofen (ADVIL,MOTRIN) 200 MG tablet Take 600 mg by mouth daily as needed for moderate pain.      [provider]  metroNIDAZOLE (FLAGYL) 500 MG tablet Take 1 tablet (500 mg total) by mouth 2 (two) times daily. Patient not taking: Reported on 04/23/2017 04/14/14   Charlestine Night, PA-C  oxyCODONE-acetaminophen (PERCOCET/ROXICET) 5-325 MG per tablet Take 1 tablet by mouth every 6 (six) hours as needed for moderate pain or severe pain. Patient not taking: Reported on 04/13/2014 01/11/14   Junious Silk, PA-C  pantoprazole (PROTONIX) 20 MG tablet Take 1 tablet (20 mg total) by mouth 2 (two) times daily. 04/23/17   Ward, Chase Picket, PA-C  promethazine (PHENERGAN) 25 MG tablet Take 1 tablet (25 mg total) by mouth every 6 (six) hours as needed for nausea or vomiting. Patient not taking: Reported on 04/13/2014 01/11/14   Junious Silk, PA-C    Family History Family History  Problem Relation Age of Onset  . Hypertension Mother   . Diabetes Mother   . Diabetes Other   . Hypertension Other     Social History Social History   Tobacco Use  . Smoking status: Never Smoker  . Smokeless tobacco: Never Used  Substance Use Topics  . Alcohol use: No  . Drug use: No     Allergies   Patient has no known allergies.   Review of Systems Review of Systems  Respiratory: Negative for shortness  of breath.   Cardiovascular: Positive for leg swelling. Negative for chest pain.  Musculoskeletal: Positive for myalgias.  Neurological: Negative for dizziness, weakness and numbness.  All other systems reviewed and are negative.    Physical Exam Updated Vital Signs BP (!) 159/104 (BP Location: Left Arm)   Pulse 70   Temp 98.3 F (36.8 C) (Oral)   Resp 14   Ht 5\' 1"  (1.549 m)   Wt 132.9 kg (293 lb)   LMP 05/18/2017   SpO2 98%   BMI 55.36 kg/m   Physical Exam  Constitutional: She is oriented to person, place, and time. She appears well-developed and well-nourished. No distress.  obese  HENT:  Head: Normocephalic and atraumatic.  Right Ear: External ear normal.  Left Ear:  External ear normal.  Nose: Nose normal.  Eyes: Right eye exhibits no discharge. Left eye exhibits no discharge.  Cardiovascular: Normal rate, regular rhythm, normal heart sounds and intact distal pulses.  Pulmonary/Chest: Effort normal and breath sounds normal.  Abdominal: Soft. There is no tenderness.  Musculoskeletal:       Right knee: She exhibits no swelling. No tenderness found.       Left knee: She exhibits no swelling. No tenderness found.       Right ankle: No tenderness.       Left ankle: No tenderness.       Right lower leg: She exhibits no tenderness and no swelling.       Left lower leg: She exhibits tenderness and swelling.       Right foot: There is tenderness and swelling.       Left foot: There is tenderness and swelling.  Neurological: She is alert and oriented to person, place, and time.  Skin: Skin is warm and dry. She is not diaphoretic.  Nursing note and vitals reviewed.    ED Treatments / Results  Labs (all labs ordered are listed, but only abnormal results are displayed) Labs Reviewed  COMPREHENSIVE METABOLIC PANEL - Abnormal; Notable for the following components:      Result Value   Total Bilirubin 0.2 (*)    All other components within normal limits  CBC WITH DIFFERENTIAL/PLATELET - Abnormal; Notable for the following components:   RBC 3.60 (*)    Hemoglobin 8.6 (*)    HCT 28.5 (*)    MCH 23.9 (*)    RDW 16.0 (*)    Platelets 434 (*)    All other components within normal limits  D-DIMER, QUANTITATIVE (NOT AT [facility]) - Abnormal; Notable for the following components:   D-Dimer, Quant 0.52 (*)    All other components within normal limits  I-STAT CG4 LACTIC ACID, ED  I-STAT BETA HCG BLOOD, ED ([facility], [facility], [facility] ONLY)    EKG  EKG Interpretation None       Radiology Dg Foot Complete Left  Result Date: 05/25/2017 CLINICAL DATA:  Foot pain and possible DVT, initial encounter EXAM: LEFT FOOT - COMPLETE 3+ VIEW COMPARISON:  None. FINDINGS: Mild soft tissue  swelling is noted. No acute fracture or dislocation is noted. IMPRESSION: Soft tissue swelling without acute bony abnormality Electronically Signed   By: Alcide CleverMark  Lukens M.D.   On: 05/25/2017 22:13   Dg Foot Complete Right  Result Date: 05/25/2017 CLINICAL DATA:  Right foot pain and possible DVT, initial encounter EXAM: RIGHT FOOT COMPLETE - 3+ VIEW COMPARISON:  None. FINDINGS: Mild soft tissue swelling is noted. No acute fracture or dislocation is noted. IMPRESSION: Soft tissue changes without  acute bony abnormality. Electronically Signed   By: Alcide Clever M.D.   On: 05/25/2017 22:14    Procedures Procedures (including critical care time)  Medications Ordered in ED Medications  HYDROcodone-acetaminophen (NORCO/VICODIN) 5-325 MG per tablet 2 tablet (2 tablets Oral Given 05/25/17 2210)     Initial Impression / Assessment and Plan / ED Course  I have reviewed the triage vital signs and the nursing notes.  Pertinent labs & imaging results that were available during my care of the patient were reviewed by me and considered in my medical decision making (see chart for details).     The patient presents with atraumatic bilateral leg pain and swelling.  It is most prominent in her feet but she does have some right calf swelling.  At this time and I we do not have DVT ultrasound available.  Lab work was obtained in triage including a d-dimer which is mildly positive.  However she does not have any chest pain or shortness of breath.  Thus I think PE is highly unlikely.  Given that it is bilateral, DVT is less likely but I think an ultrasound in the morning is reasonable.  She will go [facility] this.  Given that most of her tenderness seems to be on her plantar feet, bilateral feet x-rays obtained and are negative.  She is able to ambulate however it is painful.  Given she is maxed out on ibuprofen and Tylenol with still significant pain she will be given a short course of hydrocodone.  I discussed  using compression hose.  She appears neurovascular intact.  The swelling does not go past her knees my suspicion of a DVT that would progress quickly is quite low.  I did offer her Lovenox but after discussion she declines.  She understands possible propagation of the DVT if there.  She is also noted to be anemic more than typical.  She just came off her menstrual cycle about a week ago.  This is probably the cause and she denies any GI bleeding.  She also declines rectal exam.  I discussed we cannot fully rule out GI bleeding but she still declines.  Advised to start on iron.  Follow-up with PCP and return to [facility] tomorrow for the DVT ultrasound.  Discussed strict return precautions.  Final Clinical Impressions(s) / ED Diagnoses   Final diagnoses:  Anemia, unspecified type  Left leg swelling  Right leg swelling    ED Discharge Orders        Ordered    LE VENOUS     05/25/17 2136    HYDROcodone-acetaminophen (NORCO) 5-325 MG tablet  Every 4 hours PRN     05/25/17 2233       Pricilla Loveless, MD 05/25/17 2321

## 2017-05-25 NOTE — ED Triage Notes (Signed)
Pt complains of bilateral foot swelling, the left one worse than the right, her feet are warm and her left lower leg is tight

## 2017-05-26 ENCOUNTER — Ambulatory Visit (HOSPITAL_COMMUNITY)
Admission: RE | Admit: 2017-05-26 | Discharge: 2017-05-26 | Disposition: A | Discharge status: Home / Self Care | Payer: Medicaid Other | Source: Ambulatory Visit | Attending: Emergency Medicine | Admitting: Emergency Medicine

## 2017-05-26 DIAGNOSIS — M7989 Other specified soft tissue disorders: Secondary | ICD-10-CM

## 2017-05-26 DIAGNOSIS — M79609 Pain in unspecified limb: Secondary | ICD-10-CM

## 2017-05-26 NOTE — Progress Notes (Signed)
VASCULAR LAB PRELIMINARY  PRELIMINARY  PRELIMINARY  PRELIMINARY  Bilateral lower extremity venous duplex completed.    Preliminary report:  There is no obvious evidence of DVT or SVT noted in the bilateral lower extremities.  Edwen Mclester, RVT 05/26/2017, 11:46 AM

## 2017-05-30 ENCOUNTER — Emergency Department (HOSPITAL_COMMUNITY)
Admission: EM | Admit: 2017-05-30 | Discharge: 2017-05-30 | Disposition: A | Discharge status: Home / Self Care | Payer: Medicaid Other | Attending: Emergency Medicine | Admitting: Emergency Medicine

## 2017-05-30 ENCOUNTER — Emergency Department (HOSPITAL_COMMUNITY): Payer: Medicaid Other

## 2017-05-30 ENCOUNTER — Other Ambulatory Visit: Payer: Self-pay

## 2017-05-30 ENCOUNTER — Encounter (HOSPITAL_COMMUNITY): Payer: Self-pay | Admitting: Emergency Medicine

## 2017-05-30 DIAGNOSIS — R609 Edema, unspecified: Secondary | ICD-10-CM | POA: Insufficient documentation

## 2017-05-30 DIAGNOSIS — I10 Essential (primary) hypertension: Secondary | ICD-10-CM | POA: Diagnosis not present

## 2017-05-30 DIAGNOSIS — Z79899 Other long term (current) drug therapy: Secondary | ICD-10-CM | POA: Diagnosis not present

## 2017-05-30 LAB — COMPREHENSIVE METABOLIC PANEL
ALT: 27 U/L (ref 14–54)
AST: 29 U/L (ref 15–41)
Albumin: 3.5 g/dL (ref 3.5–5.0)
Alkaline Phosphatase: 41 U/L (ref 38–126)
Anion gap: 8 (ref 5–15)
BUN: 10 mg/dL (ref 6–20)
CHLORIDE: 102 mmol/L (ref 101–111)
CO2: 27 mmol/L (ref 22–32)
Calcium: 9.1 mg/dL (ref 8.9–10.3)
Creatinine, Ser: 0.72 mg/dL (ref 0.44–1.00)
Glucose, Bld: 153 mg/dL — ABNORMAL HIGH (ref 65–99)
POTASSIUM: 3.8 mmol/L (ref 3.5–5.1)
Sodium: 137 mmol/L (ref 135–145)
Total Bilirubin: 0.5 mg/dL (ref 0.3–1.2)
Total Protein: 6.5 g/dL (ref 6.5–8.1)

## 2017-05-30 LAB — CBC
HCT: 29 % — ABNORMAL LOW (ref 36.0–46.0)
Hemoglobin: 8.6 g/dL — ABNORMAL LOW (ref 12.0–15.0)
MCH: 23.6 pg — AB (ref 26.0–34.0)
MCHC: 29.7 g/dL — AB (ref 30.0–36.0)
MCV: 79.5 fL (ref 78.0–100.0)
PLATELETS: 378 10*3/uL (ref 150–400)
RBC: 3.65 MIL/uL — ABNORMAL LOW (ref 3.87–5.11)
RDW: 16.2 % — AB (ref 11.5–15.5)
WBC: 8.2 10*3/uL (ref 4.0–10.5)

## 2017-05-30 NOTE — ED Provider Notes (Signed)
Patient placed in Quick Look pathway, seen and evaluated   Chief Complaint: BL leg swellijng  HPI:   Morbidly obese female with worsening BL edema. Patient was told by her mother to come to the ER because it coud be "heartfailure or lymphede  ROS: leg swelling, negative for cp orthopnea sob (one)  Physical Exam:   Gen: No distress  Neuro: Awake and Alert  Skin: Warm    Focused Exam: Obese abdomen. BL le edema,, firm, non pittting, woody skin changes suggestive of chronic edema   Initiation of care has begun. The patient has been counseled on the process, plan, and necessity for staying for the completion/evaluation, and the remainder of the medical screening examination    Arthor CaptainHarris, Avarose Mervine, PA-C 05/30/17 1726    Cardama, Amadeo GarnetPedro Eduardo, MD 05/31/17 [phone]

## 2017-05-30 NOTE — ED Provider Notes (Signed)
[facility] EMERGENCY DEPARTMENT Provider Note   CSN: [id number] Arrival date & time: 05/30/17  1643     History   Chief Complaint Chief Complaint  Patient presents with  . foot swelling    HPI Vanessa Morton is a 32 y.o. female whom I assess in quick look.  Patient presents the emergency department chief complaint bilateral leg swelling.  This is been ongoing and worsening over time.  She states that her mother wanted her to come to the emergency department because it could potentially be heart failure or your or lymphedema.  Denies chest pain, shortness of breath, orthopnea, PND.  HPI  Past Medical History:  Diagnosis Date  . Abscess   . Chest pain   . Gonorrhea   . Hypertension   . Obesity   . Urinary tract infection     There are no active problems to display for this patient.   Past Surgical History:  Procedure Laterality Date  . CESAREAN SECTION    . TONSILLECTOMY      OB History    No data available       Home Medications    Prior to Admission medications   Medication Sig Start Date End Date Taking? Authorizing Provider  Aspirin-Acetaminophen 500-325 MG PACK Take 1 each by mouth every 6 (six) hours as needed (pain).    [provider]  doxycycline (VIBRAMYCIN) 100 MG capsule Take 1 capsule (100 mg total) by mouth 2 (two) times daily. Patient not taking: Reported on 04/23/2017 04/14/14   Charlestine Night, PA-C  HYDROcodone-acetaminophen (NORCO) 5-325 MG tablet Take 1-2 tablets by mouth every 4 (four) hours as needed for severe pain. 05/25/17   Pricilla Loveless, MD  ibuprofen (ADVIL,MOTRIN) 200 MG tablet Take 600 mg by mouth daily as needed for moderate pain.     [provider]  metroNIDAZOLE (FLAGYL) 500 MG tablet Take 1 tablet (500 mg total) by mouth 2 (two) times daily. Patient not taking: Reported on 04/23/2017 04/14/14   Charlestine Night, PA-C  oxyCODONE-acetaminophen (PERCOCET/ROXICET) 5-325 MG per tablet Take 1  tablet by mouth every 6 (six) hours as needed for moderate pain or severe pain. Patient not taking: Reported on 04/13/2014 01/11/14   Junious Silk, PA-C  pantoprazole (PROTONIX) 20 MG tablet Take 1 tablet (20 mg total) by mouth 2 (two) times daily. 04/23/17   Ward, Chase Picket, PA-C  promethazine (PHENERGAN) 25 MG tablet Take 1 tablet (25 mg total) by mouth every 6 (six) hours as needed for nausea or vomiting. Patient not taking: Reported on 04/13/2014 01/11/14   Junious Silk, PA-C    Family History Family History  Problem Relation Age of Onset  . Hypertension Mother   . Diabetes Mother   . Diabetes Other   . Hypertension Other     Social History Social History   Tobacco Use  . Smoking status: Never Smoker  . Smokeless tobacco: Never Used  Substance Use Topics  . Alcohol use: No  . Drug use: No     Allergies   Patient has no known allergies.   Review of Systems Review of Systems  Ten systems reviewed and are negative for acute change, except as noted in the HPI.   Physical Exam Updated Vital Signs BP (!) 150/99 (BP Location: Right Wrist)   Pulse 95   Temp 98.4 F (36.9 C) (Oral)   Resp 18   LMP 05/18/2017   SpO2 100%   Physical Exam Physical Exam  Nursing note  and vitals reviewed. Constitutional: Morbidly obese female in no acute distress HENT:  Head: Normocephalic and atraumatic.  Eyes: Conjunctivae normal and EOM are normal. Pupils are equal, round, and reactive to light. No scleral icterus.  Neck: Normal range of motion.  Cardiovascular: Normal rate, regular rhythm and normal heart sounds.  Exam reveals no gallop and no friction rub.   No murmur heard. Bilateral nonpitting equal peripheral edema.  Bloody appearance of the skin suggestive of chronic edema.  Normal pulses bilaterally, no heat or redness Pulmonary/Chest: Effort normal and breath sounds normal. No respiratory distress.  Abdominal: Soft. Bowel sounds are normal. She exhibits no distension  and no mass. There is no tenderness. There is no guarding.  Neurological: She is alert and oriented to person, place, and time.  Skin: Skin is warm and dry. She is not diaphoretic.     ED Treatments / Results  Labs (all labs ordered are listed, but only abnormal results are displayed) Labs Reviewed  CBC - Abnormal; Notable for the following components:      Result Value   RBC 3.65 (*)    Hemoglobin 8.6 (*)    HCT 29.0 (*)    MCH 23.6 (*)    MCHC 29.7 (*)    RDW 16.2 (*)    All other components within normal limits  COMPREHENSIVE METABOLIC PANEL - Abnormal; Notable for the following components:   Glucose, Bld 153 (*)    All other components within normal limits    EKG  EKG Interpretation None      ECG interpretation   Date: 05/30/2017  Rate: 80  Rhythm: normal sinus rhythm  QRS Axis: normal  Intervals: normal  ST/T Wave abnormalities:  Nonspecific t wave abnormalities  Conduction Disutrbances: none  Narrative Interpretation: abnormal ecg     Radiology Dg Chest 2 View  Result Date: 05/30/2017 CLINICAL DATA:  Bilateral foot swelling with worsening. EXAM: CHEST - 2 VIEW COMPARISON:  06/25/2012 FINDINGS: Lung volumes are low with crowding of interstitial lung markings. Stable cardiomegaly is noted without aortic aneurysm. No overt pulmonary edema, pneumonic consolidation, effusion or pneumothorax. No acute osseous abnormality. IMPRESSION: Stable cardiomegaly. No active pulmonary disease. Low lung volumes are redemonstrated. Electronically Signed   By: Tollie Ethavid  Kwon M.D.   On: 05/30/2017 18:42    Procedures Procedures (including critical care time)  Medications Ordered in ED Medications - No data to display   Initial Impression / Assessment and Plan / ED Course  I have reviewed the triage vital signs and the nursing notes.  Pertinent labs & imaging results that were available during my care of the patient were reviewed by me and considered in my medical decision  making (see chart for details).  Clinical Course as of May 30 1956  Fri May 30, 2017  1958 Hgb at baseline Hemoglobin: (!) 8.6 [AH]    Clinical Course User Index [AH] Arthor CaptainHarris, Mallery Harshman, PA-C    His EKG, chest x-ray and labs all reviewed.  No significant abnormalities.  I believe that her peripheral edema for which she has been seen for the past 3 days is due to her obesity.  I have advised the patient on weight loss, compression hose, and elevating her legs.  She appears appropriate for discharge at this time  Final Clinical Impressions(s) / ED Diagnoses   Final diagnoses:  None    ED Discharge Orders    None       Arthor CaptainHarris, Alannah Averhart, PA-C 05/30/17 2050    Drema Pryardama, Pedro  Domingo Cocking, MD 05/31/17 [phone]

## 2017-05-30 NOTE — ED Triage Notes (Signed)
Pt reports bilateral foot swelling , seen 3/10 for same and had bilateral DVT studies. Pt states swelling is worse, pt states "my mom wants me tested for heart failure and lymph edema."

## 2017-05-30 NOTE — Discharge Instructions (Addendum)
You do not appear to have any heart failure as a cause of your peripheral edema.  Your swelling is likely due to being overweight.  Things that can help with this are some weight loss along with compression stockings, elevating her feet and legs at night.  If he continued to have issues with your legs please follow-up with your primary care physician as soon as possible.  Get help right away if: You develop shortness of breath or chest pain. You cannot breathe when you lie down. You develop pain, redness, or warmth in the swollen areas. You have heart, liver, or kidney disease and suddenly get edema. You have a fever and your symptoms suddenly get worse.

## 2017-07-02 ENCOUNTER — Other Ambulatory Visit: Payer: Self-pay

## 2017-07-02 ENCOUNTER — Emergency Department (HOSPITAL_COMMUNITY)
Admission: EM | Admit: 2017-07-02 | Discharge: 2017-07-02 | Disposition: A | Discharge status: Home / Self Care | Payer: Medicaid Other | Attending: Emergency Medicine | Admitting: Emergency Medicine

## 2017-07-02 ENCOUNTER — Emergency Department (HOSPITAL_COMMUNITY): Payer: Medicaid Other

## 2017-07-02 ENCOUNTER — Encounter (HOSPITAL_COMMUNITY): Payer: Self-pay

## 2017-07-02 DIAGNOSIS — M25511 Pain in right shoulder: Secondary | ICD-10-CM | POA: Diagnosis present

## 2017-07-02 DIAGNOSIS — Z79899 Other long term (current) drug therapy: Secondary | ICD-10-CM | POA: Insufficient documentation

## 2017-07-02 DIAGNOSIS — R0789 Other chest pain: Secondary | ICD-10-CM | POA: Diagnosis not present

## 2017-07-02 DIAGNOSIS — I10 Essential (primary) hypertension: Secondary | ICD-10-CM | POA: Diagnosis not present

## 2017-07-02 DIAGNOSIS — R202 Paresthesia of skin: Secondary | ICD-10-CM | POA: Insufficient documentation

## 2017-07-02 LAB — CBC
HEMATOCRIT: 31.7 % — AB (ref 36.0–46.0)
HEMOGLOBIN: 9.3 g/dL — AB (ref 12.0–15.0)
MCH: 23.1 pg — ABNORMAL LOW (ref 26.0–34.0)
MCHC: 29.3 g/dL — ABNORMAL LOW (ref 30.0–36.0)
MCV: 78.9 fL (ref 78.0–100.0)
Platelets: 375 10*3/uL (ref 150–400)
RBC: 4.02 MIL/uL (ref 3.87–5.11)
RDW: 15.5 % (ref 11.5–15.5)
WBC: 9 10*3/uL (ref 4.0–10.5)

## 2017-07-02 LAB — BASIC METABOLIC PANEL
ANION GAP: 8 (ref 5–15)
BUN: 11 mg/dL (ref 6–20)
CALCIUM: 9.2 mg/dL (ref 8.9–10.3)
CO2: 25 mmol/L (ref 22–32)
Chloride: 107 mmol/L (ref 101–111)
Creatinine, Ser: 0.59 mg/dL (ref 0.44–1.00)
Glucose, Bld: 106 mg/dL — ABNORMAL HIGH (ref 65–99)
POTASSIUM: 3.7 mmol/L (ref 3.5–5.1)
SODIUM: 140 mmol/L (ref 135–145)

## 2017-07-02 LAB — I-STAT BETA HCG BLOOD, ED (MC, WL, AP ONLY)

## 2017-07-02 LAB — I-STAT TROPONIN, ED: TROPONIN I, POC: 0 ng/mL (ref 0.00–0.08)

## 2017-07-02 MED ORDER — METHOCARBAMOL 500 MG PO TABS: 500.0000 mg | ORAL_TABLET | Freq: Every evening | ORAL | 0 refills | Status: DC | PRN

## 2017-07-02 MED ORDER — MELOXICAM 7.5 MG PO TABS: 7.5000 mg | ORAL_TABLET | Freq: Every day | ORAL | 0 refills | Status: DC

## 2017-07-02 NOTE — ED Triage Notes (Signed)
Pt reports that she has had rt shoulder pain 8/10 x 2  weeks with swelling and numbness and tingling down rt arm . Pt reports that she has rt sided chest pain pain that is sharp and aches 7/10

## 2017-07-02 NOTE — Discharge Instructions (Addendum)
Take mobic once a day with meals.  Do not take other anti-inflammatories at the same time open (Advil, Motrin, naproxen, ibuprofen, Aleve). You may supplement with Tylenol if you need further pain control. Use ice packs or heating pads if this helps control your pain. Use the muscle relaxer as needed for pain. Have caution, as this may make you tired or groggy,. Do not drive or operate heavy machinery until you know how this affects you.  Follow up with your primary care doctor for further evaluation and management. Return to the ER if you develop numbness, start dropping things with your hand, or any new or concerning symptoms.

## 2017-07-02 NOTE — ED Provider Notes (Signed)
[facility]-EMERGENCY DEPT Provider Note   CSN: [id number] Arrival date & time: 07/02/17  1212     History   Chief Complaint Chief Complaint  Patient presents with  . Shoulder Pain  . Chest Pain    HPI Vanessa Morton is a 32 y.o. female presenting for evaluation of right sided arm, shoulder, and chest pain.  Patient states for the past 2 weeks, she has been having right-sided shoulder pain.  The pain shoots down her arm into her hand.  She reports associated tingling without numbness.  Pain is worse with certain movements.  It comes up into the right side of her neck, but no pain in the middle of her neck.  Yesterday she developed some right-sided chest pain, also radiating from her shoulder.  Right-sided chest pain is described as a throbbing, which is intermittent and reproducible with palpation.  She denies change in activity, heavy lifting, or abnormal activity.  She denies fevers, chills, shortness of breath, left-sided chest pain, cough, nausea, vomiting, abdominal pain, or abnormal urination.  She denies leg pain or swelling.  She has not discussed her symptoms with her primary care doctor.  She has not taken anything for her pain including Tylenol or ibuprofen.  Nothing makes it better.  She states that she has gained weight recently.  She stopped using Suboxone strips last week because she felt that this was the cause of her weight gain.  Pain began gradually.  She denies fall, trauma, or injury.  HPI  Past Medical History:  Diagnosis Date  . Abscess   . Chest pain   . Gonorrhea   . Hypertension   . Obesity   . Urinary tract infection     There are no active problems to display for this patient.   Past Surgical History:  Procedure Laterality Date  . CESAREAN SECTION    . TONSILLECTOMY       OB History   None      Home Medications    Prior to Admission medications   Medication Sig Start Date End Date Taking? Authorizing Provider    Aspirin-Acetaminophen 500-325 MG PACK Take 1 each by mouth every 6 (six) hours as needed (pain).    [provider]  doxycycline (VIBRAMYCIN) 100 MG capsule Take 1 capsule (100 mg total) by mouth 2 (two) times daily. Patient not taking: Reported on 04/23/2017 04/14/14   Charlestine Night, PA-C  HYDROcodone-acetaminophen (NORCO) 5-325 MG tablet Take 1-2 tablets by mouth every 4 (four) hours as needed for severe pain. 05/25/17   Pricilla Loveless, MD  ibuprofen (ADVIL,MOTRIN) 200 MG tablet Take 600 mg by mouth daily as needed for moderate pain.     [provider]  meloxicam (MOBIC) 7.5 MG tablet Take 1 tablet (7.5 mg total) by mouth daily. 07/02/17   Jaleesa Cervi, PA-C  methocarbamol (ROBAXIN) 500 MG tablet Take 1 tablet (500 mg total) by mouth at bedtime as needed for muscle spasms. 07/02/17   Lindy Garczynski, PA-C  metroNIDAZOLE (FLAGYL) 500 MG tablet Take 1 tablet (500 mg total) by mouth 2 (two) times daily. Patient not taking: Reported on 04/23/2017 04/14/14   Charlestine Night, PA-C  oxyCODONE-acetaminophen (PERCOCET/ROXICET) 5-325 MG per tablet Take 1 tablet by mouth every 6 (six) hours as needed for moderate pain or severe pain. Patient not taking: Reported on 04/13/2014 01/11/14   Junious Silk, PA-C  pantoprazole (PROTONIX) 20 MG tablet Take 1 tablet (20 mg total) by mouth 2 (two) times daily. 04/23/17  Ward, Chase PicketJaime Pilcher, PA-C  promethazine (PHENERGAN) 25 MG tablet Take 1 tablet (25 mg total) by mouth every 6 (six) hours as needed for nausea or vomiting. Patient not taking: Reported on 04/13/2014 01/11/14   Junious SilkMerrell, Hannah, PA-C    Family History Family History  Problem Relation Age of Onset  . Hypertension Mother   . Diabetes Mother   . Diabetes Other   . Hypertension Other     Social History Social History   Tobacco Use  . Smoking status: Never Smoker  . Smokeless tobacco: Never Used  Substance Use Topics  . Alcohol use: No  . Drug use: No      Allergies   Patient has no known allergies.   Review of Systems Review of Systems  Respiratory: Negative for cough, chest tightness and shortness of breath.   Cardiovascular: Positive for chest pain (R sided ). Negative for palpitations and leg swelling.  Gastrointestinal: Negative for abdominal pain, nausea and vomiting.  Musculoskeletal: Positive for arthralgias. Negative for back pain, neck pain and neck stiffness.  Skin: Negative for rash.  All other systems reviewed and are negative.    Physical Exam Updated Vital Signs BP 136/89   Pulse 65   Temp 98.7 F (37.1 C) (Oral)   Resp 17   Ht 5\' 1"  (1.549 m)   Wt 136.1 kg (300 lb)   SpO2 100%   BMI 56.68 kg/m   Physical Exam  Constitutional: She is oriented to person, place, and time. She appears well-developed and well-nourished. No distress.  Pt sitting comfortably in bed in no apparent distress  HENT:  Head: Normocephalic and atraumatic.  Eyes: Pupils are equal, round, and reactive to light. Conjunctivae and EOM are normal.  Neck: Normal range of motion. Neck supple.  No tenderness to palpation midline C-spine.  No step-offs.  Tenderness to palpation of right-sided neck musculature  Cardiovascular: Normal rate, regular rhythm and intact distal pulses.  Pulmonary/Chest: Effort normal and breath sounds normal. No respiratory distress. She has no wheezes. She exhibits tenderness.  Tenderness to palpation of anterior chest wall  Abdominal: Soft. She exhibits no distension and no mass. There is no tenderness. There is no guarding.  Musculoskeletal: Normal range of motion. She exhibits tenderness. She exhibits no deformity.  Tenderness to palpation of right shoulder, biceps, and forearm.  Increased pain with palpation of the ulnar groove.  Positive Tinel's.  Grip strength equal bilaterally.  Sensation intact bilaterally.  Radial pulses intact bilaterally. No warmth or swelling  Neurological: She is alert and oriented to  person, place, and time. No sensory deficit.  Skin: Skin is warm and dry.  Psychiatric: She has a normal mood and affect.  Nursing note and vitals reviewed.    ED Treatments / Results  Labs (all labs ordered are listed, but only abnormal results are displayed) Labs Reviewed  BASIC METABOLIC PANEL - Abnormal; Notable for the following components:      Result Value   Glucose, Bld 106 (*)    All other components within normal limits  CBC - Abnormal; Notable for the following components:   Hemoglobin 9.3 (*)    HCT 31.7 (*)    MCH 23.1 (*)    MCHC 29.3 (*)    All other components within normal limits  I-STAT TROPONIN, ED  I-STAT BETA HCG BLOOD, ED ([facility], [facility], [facility] ONLY)    EKG None  Radiology No results found.  Procedures Procedures (including critical care time)  Medications Ordered in ED Medications -  No data to display   Initial Impression / Assessment and Plan / ED Course  I have reviewed the triage vital signs and the nursing notes.  Pertinent labs & imaging results that were available during my care of the patient were reviewed by me and considered in my medical decision making (see chart for details).     Patient presenting for evaluation of right-sided shoulder and arm pain.  Physical exam reassuring, patient is neurovascularly intact.  No tenderness palpation of midline C-spine.  Increased pain with palpation of the ulnar groove and positive Tinel's.  ?radicular etiology.  Doubt ACS, PE, infection.  Labs reassuring, no leukocytosis.  Hemoglobin stable/baseline.  No electrolyte abnormality.  EKG without STEMI. Discussed findings with patient.  Because likely radicular etiology, and symptom medic treatment with NSAIDs, steroids, and muscle relaxers.  Patient states she does not want steroids.  Will prescribe Mobic and Robaxin.  Encouraged follow-up with PCP.  At this time, patient appears safe for discharge.  Return precautions given.  Patient states she understands and  agrees to plan.  Final Clinical Impressions(s) / ED Diagnoses   Final diagnoses:  Acute pain of right shoulder    ED Discharge Orders        Ordered    meloxicam (MOBIC) 7.5 MG tablet  Daily     07/02/17 1408    methocarbamol (ROBAXIN) 500 MG tablet  At bedtime PRN     07/02/17 1408       Tenika Keeran, PA-C 07/02/17 1553    Azalia Bilis, MD 07/02/17 1640

## 2017-12-24 ENCOUNTER — Other Ambulatory Visit: Payer: Self-pay

## 2017-12-24 ENCOUNTER — Emergency Department (HOSPITAL_BASED_OUTPATIENT_CLINIC_OR_DEPARTMENT_OTHER): Payer: Medicaid Other

## 2017-12-24 ENCOUNTER — Emergency Department (HOSPITAL_COMMUNITY)
Admission: EM | Admit: 2017-12-24 | Discharge: 2017-12-24 | Disposition: A | Discharge status: Home / Self Care | Payer: Medicaid Other | Attending: Emergency Medicine | Admitting: Emergency Medicine

## 2017-12-24 ENCOUNTER — Encounter (HOSPITAL_COMMUNITY): Payer: Self-pay

## 2017-12-24 DIAGNOSIS — R600 Localized edema: Secondary | ICD-10-CM | POA: Insufficient documentation

## 2017-12-24 DIAGNOSIS — I10 Essential (primary) hypertension: Secondary | ICD-10-CM | POA: Diagnosis not present

## 2017-12-24 DIAGNOSIS — Z79899 Other long term (current) drug therapy: Secondary | ICD-10-CM | POA: Insufficient documentation

## 2017-12-24 DIAGNOSIS — M79609 Pain in unspecified limb: Secondary | ICD-10-CM | POA: Diagnosis not present

## 2017-12-24 DIAGNOSIS — M7989 Other specified soft tissue disorders: Secondary | ICD-10-CM

## 2017-12-24 DIAGNOSIS — R609 Edema, unspecified: Secondary | ICD-10-CM

## 2017-12-24 DIAGNOSIS — R2241 Localized swelling, mass and lump, right lower limb: Secondary | ICD-10-CM | POA: Diagnosis present

## 2017-12-24 LAB — BASIC METABOLIC PANEL
ANION GAP: 11 (ref 5–15)
BUN: 14 mg/dL (ref 6–20)
CO2: 25 mmol/L (ref 22–32)
Calcium: 9.2 mg/dL (ref 8.9–10.3)
Chloride: 105 mmol/L (ref 98–111)
Creatinine, Ser: 0.68 mg/dL (ref 0.44–1.00)
GFR calc Af Amer: >60 mL/min (ref >60)
GFR calc non Af Amer: >60 mL/min (ref >60)
Glucose, Bld: 106 mg/dL — ABNORMAL HIGH (ref 70–99)
POTASSIUM: 4 mmol/L (ref 3.5–5.1)
SODIUM: 141 mmol/L (ref 135–145)

## 2017-12-24 LAB — CBC WITH DIFFERENTIAL/PLATELET
ABS IMMATURE GRANULOCYTES: 0.03 10*3/uL (ref 0.00–0.07)
Basophils Absolute: 0 10*3/uL (ref 0.0–0.1)
Basophils Relative: 0 %
Eosinophils Absolute: 0.3 10*3/uL (ref 0.0–0.5)
Eosinophils Relative: 3 %
HCT: 31.4 % — ABNORMAL LOW (ref 36.0–46.0)
HEMOGLOBIN: 9.2 g/dL — AB (ref 12.0–15.0)
Immature Granulocytes: 0 %
LYMPHS PCT: 31 %
Lymphs Abs: 2.9 10*3/uL (ref 0.7–4.0)
MCH: 24.4 pg — AB (ref 26.0–34.0)
MCHC: 29.3 g/dL — ABNORMAL LOW (ref 30.0–36.0)
MCV: 83.3 fL (ref 80.0–100.0)
MONO ABS: 0.7 10*3/uL (ref 0.1–1.0)
MONOS PCT: 7 %
NEUTROS ABS: 5.5 10*3/uL (ref 1.7–7.7)
Neutrophils Relative %: 59 %
Platelets: 265 10*3/uL (ref 150–400)
RBC: 3.77 MIL/uL — AB (ref 3.87–5.11)
RDW: 17.2 % — ABNORMAL HIGH (ref 11.5–15.5)
WBC: 9.5 10*3/uL (ref 4.0–10.5)
nRBC: 0 % (ref 0.0–0.2)

## 2017-12-24 LAB — D-DIMER, QUANTITATIVE (NOT AT ARMC)

## 2017-12-24 NOTE — Discharge Instructions (Addendum)
The swelling and discomfort in your legs is secondary to edema.  Elevate your legs above your heart is much as possible, and use your compression stockings to help with fluid retention.

## 2017-12-24 NOTE — ED Triage Notes (Signed)
Pt reports bilateral leg swelling with her left being worse. She states that she has had cellulitis before and this feels similar. She also endorses some intermittent SOB. A&Ox4. States that nothing improves it.

## 2017-12-24 NOTE — ED Provider Notes (Signed)
[facility]-EMERGENCY DEPT Provider Note   CSN: [id number] Arrival date & time: 12/24/17  0214     History   Chief Complaint Chief Complaint  Patient presents with  . Leg Swelling    HPI Vanessa Morton is a 32 y.o. female.  HPI   She presents for evaluation of left greater than right leg swelling for several days.  She is concerned that she might have cellulitis.  She denies fever, chills, shortness of breath, chest pain, weakness or dizziness.  She has been eating well.  No recent trauma to the leg.  There are no other known modifying factors.  Past Medical History:  Diagnosis Date  . Abscess   . Chest pain   . Gonorrhea   . Hypertension   . Obesity   . Urinary tract infection     There are no active problems to display for this patient.   Past Surgical History:  Procedure Laterality Date  . CESAREAN SECTION    . TONSILLECTOMY       OB History   None      Home Medications    Prior to Admission medications   Medication Sig Start Date End Date Taking? Authorizing Provider  ibuprofen (ADVIL,MOTRIN) 200 MG tablet Take 600 mg by mouth daily as needed for moderate pain.    Yes [provider]  SUBOXONE 8-2 MG FILM Place 1 strip under the tongue 2 (two) times daily. 12/16/17  Yes [provider]  doxycycline (VIBRAMYCIN) 100 MG capsule Take 1 capsule (100 mg total) by mouth 2 (two) times daily. Patient not taking: Reported on 04/23/2017 04/14/14   Charlestine Night, PA-C  HYDROcodone-acetaminophen (NORCO) 5-325 MG tablet Take 1-2 tablets by mouth every 4 (four) hours as needed for severe pain. Patient not taking: Reported on 12/24/2017 05/25/17   Pricilla Loveless, MD  meloxicam (MOBIC) 7.5 MG tablet Take 1 tablet (7.5 mg total) by mouth daily. Patient not taking: Reported on 12/24/2017 07/02/17   Caccavale, Sophia, PA-C  methocarbamol (ROBAXIN) 500 MG tablet Take 1 tablet (500 mg total) by mouth at bedtime as needed for muscle  spasms. Patient not taking: Reported on 12/24/2017 07/02/17   Caccavale, Sophia, PA-C  metroNIDAZOLE (FLAGYL) 500 MG tablet Take 1 tablet (500 mg total) by mouth 2 (two) times daily. Patient not taking: Reported on 04/23/2017 04/14/14   Charlestine Night, PA-C  oxyCODONE-acetaminophen (PERCOCET/ROXICET) 5-325 MG per tablet Take 1 tablet by mouth every 6 (six) hours as needed for moderate pain or severe pain. Patient not taking: Reported on 04/13/2014 01/11/14   Junious Silk, PA-C  pantoprazole (PROTONIX) 20 MG tablet Take 1 tablet (20 mg total) by mouth 2 (two) times daily. Patient not taking: Reported on 12/24/2017 04/23/17   Ward, Chase Picket, PA-C  promethazine (PHENERGAN) 25 MG tablet Take 1 tablet (25 mg total) by mouth every 6 (six) hours as needed for nausea or vomiting. Patient not taking: Reported on 04/13/2014 01/11/14   Junious Silk, PA-C    Family History Family History  Problem Relation Age of Onset  . Hypertension Mother   . Diabetes Mother   . Diabetes Other   . Hypertension Other     Social History Social History   Tobacco Use  . Smoking status: Never Smoker  . Smokeless tobacco: Never Used  Substance Use Topics  . Alcohol use: No  . Drug use: No     Allergies   Patient has no known allergies.   Review of Systems Review  of Systems  All other systems reviewed and are negative.    Physical Exam Updated Vital Signs BP 116/88   Pulse 86   Temp 97.8 F (36.6 C) (Oral)   Resp 16   Ht 5\' 1"  (1.549 m)   Wt (!) 139.3 kg   SpO2 99%   BMI 58.01 kg/m   Physical Exam  Constitutional: She is oriented to person, place, and time. She appears well-developed.  Morbidly obese  HENT:  Head: Normocephalic and atraumatic.  Eyes: Pupils are equal, round, and reactive to light. Conjunctivae and EOM are normal.  Neck: Normal range of motion and phonation normal. Neck supple.  Cardiovascular: Normal rate and regular rhythm.  Pulmonary/Chest: Effort normal and  breath sounds normal. She exhibits no tenderness.  Abdominal: Soft. She exhibits no distension. There is no tenderness. There is no guarding.  Musculoskeletal: Normal range of motion. She exhibits edema (Left leg tender and swollen from knee to toes.  Left lower leg diffusely tender.).  No redness, drainage, rash or skin lesions of the left lower leg.  Neurological: She is alert and oriented to person, place, and time. She exhibits normal muscle tone.  Skin: Skin is warm and dry.  Psychiatric: She has a normal mood and affect. Her behavior is normal. Judgment and thought content normal.  Nursing note and vitals reviewed.    ED Treatments / Results  Labs (all labs ordered are listed, but only abnormal results are displayed) Labs Reviewed  CBC WITH DIFFERENTIAL/PLATELET - Abnormal; Notable for the following components:      Result Value   RBC 3.77 (*)    Hemoglobin 9.2 (*)    HCT 31.4 (*)    MCH 24.4 (*)    MCHC 29.3 (*)    RDW 17.2 (*)    All other components within normal limits  BASIC METABOLIC PANEL - Abnormal; Notable for the following components:   Glucose, Bld 106 (*)    All other components within normal limits  D-DIMER, QUANTITATIVE (NOT AT [facility])    EKG None  Radiology No results found.  Procedures Procedures (including critical care time)  Medications Ordered in ED Medications - No data to display   Initial Impression / Assessment and Plan / ED Course  I have reviewed the triage vital signs and the nursing notes.  Pertinent labs & imaging results that were available during my care of the patient were reviewed by me and considered in my medical decision making (see chart for details).  Clinical Course as of Dec 25 1002  Wed Dec 24, 2017  1610 Venous Doppler left leg, performed, negative.   [EW]  0935 Normal  D-dimer, quantitative (not at [facility]) [EW]  0935 Normal except hemoglobin low  CBC with Differential/Platelet(!) [EW]  0935 Normal except glucose  slightly elevated  Basic metabolic panel(!) [EW]    Clinical Course User Index [EW] Mancel Bale, MD     Patient Vitals for the past 24 hrs:  BP Temp Temp src Pulse Resp SpO2 Height Weight  12/24/17 0900 116/88 - - 86 16 99 % - -  12/24/17 0800 126/82 - - - - - - -  12/24/17 0745 - - - 88 - 91 % - -  12/24/17 0700 120/75 - - 70 16 99 % - -  12/24/17 0316 125/82 - - 71 15 100 % - -  12/24/17 0232 (!) 140/99 97.8 F (36.6 C) Oral 79 16 100 % 5\' 1"  (1.549 m) (!) 139.3 kg  9:39 AM Reevaluation with update and discussion. After initial assessment and treatment, an updated evaluation reveals no additional complaints.  She now states that she has compression stockings to use for ongoing leg swelling but has not used them recently.  Findings discussed and questions answered. Mancel Bale   Medical Decision Making: Leg swelling likely due to body habitus.  No evidence for acute congestive heart failure, venous thromboembolism or metabolic instability.  Doubt cellulitis as a cause of her discomfort.  CRITICAL CARE-no Performed by: Mancel Bale   Nursing Notes Reviewed/ Care Coordinated Applicable Imaging Reviewed Interpretation of Laboratory Data incorporated into ED treatment  The patient appears reasonably screened and/or stabilized for discharge and I doubt any other medical condition or other [facility] requiring further screening, evaluation, or treatment in the ED at this time prior to discharge.  Plan: Home Medications-OTC, PRN; Home Treatments-elevation and compression; return here if the recommended treatment, does not improve the symptoms; Recommended follow up-PCP follow-up as needed.     Final Clinical Impressions(s) / ED Diagnoses   Final diagnoses:  Peripheral edema  Leg swelling    ED Discharge Orders    None       Mancel Bale, MD 12/24/17 1005

## 2017-12-24 NOTE — Progress Notes (Signed)
Left lower extremity venous duplex completed. Preliminary results - Technically difficult due to body habitus - There is no evidence of DVT or Baker's cyst. There is no change since 05/2017. [employer], RVS 12/24/2017, 9:40 AM

## 2018-01-02 ENCOUNTER — Inpatient Hospital Stay (HOSPITAL_COMMUNITY)
Admission: AD | Admit: 2018-01-02 | Discharge: 2018-01-02 | Disposition: A | Discharge status: Home / Self Care | Payer: Medicaid Other | Source: Ambulatory Visit | Attending: Obstetrics and Gynecology | Admitting: Obstetrics and Gynecology

## 2018-01-02 DIAGNOSIS — L03311 Cellulitis of abdominal wall: Secondary | ICD-10-CM | POA: Diagnosis not present

## 2018-01-02 DIAGNOSIS — R109 Unspecified abdominal pain: Secondary | ICD-10-CM | POA: Diagnosis present

## 2018-01-02 LAB — URINALYSIS, ROUTINE W REFLEX MICROSCOPIC
BILIRUBIN URINE: NEGATIVE
GLUCOSE, UA: NEGATIVE mg/dL
Ketones, ur: NEGATIVE mg/dL
NITRITE: NEGATIVE
PH: 7 (ref 5.0–8.0)
Protein, ur: NEGATIVE mg/dL
SPECIFIC GRAVITY, URINE: 1.017 (ref 1.005–1.030)

## 2018-01-02 LAB — POCT PREGNANCY, URINE: Preg Test, Ur: NEGATIVE

## 2018-01-02 MED ORDER — CEPHALEXIN 500 MG PO CAPS: 500.0000 mg | ORAL_CAPSULE | Freq: Three times a day (TID) | ORAL | 0 refills | Status: AC

## 2018-01-02 NOTE — MAU Provider Note (Signed)
History     CSN: [id number]  Arrival date and time: 01/02/18 0126   First Provider Initiated Contact with Patient 01/02/18 0200      Chief Complaint  Patient presents with  . Abdominal Pain   Vanessa Morton is a 32 y.o. Non-pregnant female is here today because she "has something on my stomach and I need antibiotics". She states that she had this about a month ago, but then it got better. She about 1-2 weeks ago. It has been gradually worsening. She states that it is a "big area all across my stomach". She reports she has 7/10 pain in this area. She has tried tylenol and ibuprofen and it has not helped.   Abdominal Pain  This is a new problem. The current episode started in the past 7 days. The onset quality is sudden. The problem has been gradually worsening. The pain is located in the suprapubic region. The pain is at a severity of 7/10. Pertinent negatives include no dysuria, fever, frequency, nausea or vomiting. Nothing aggravates the pain. The pain is relieved by nothing. She has tried nothing for the symptoms.    OB History   None     Past Medical History:  Diagnosis Date  . Abscess   . Chest pain   . Gonorrhea   . Hypertension   . Obesity   . Urinary tract infection     Past Surgical History:  Procedure Laterality Date  . CESAREAN SECTION    . TONSILLECTOMY      Family History  Problem Relation Age of Onset  . Hypertension Mother   . Diabetes Mother   . Diabetes Other   . Hypertension Other     Social History   Tobacco Use  . Smoking status: Never Smoker  . Smokeless tobacco: Never Used  Substance Use Topics  . Alcohol use: No  . Drug use: No    Allergies: No Known Allergies  Medications Prior to Admission  Medication Sig Dispense Refill Last Dose  . ibuprofen (ADVIL,MOTRIN) 200 MG tablet Take 600 mg by mouth daily as needed for moderate pain.    01/01/2018 at 2000  . SUBOXONE 8-2 MG FILM Place 1 strip under the tongue 2 (two) times daily.  0  01/01/2018 at Unknown time  . doxycycline (VIBRAMYCIN) 100 MG capsule Take 1 capsule (100 mg total) by mouth 2 (two) times daily. (Patient not taking: Reported on 04/23/2017) 28 capsule 0 Not Taking at Unknown time  . HYDROcodone-acetaminophen (NORCO) 5-325 MG tablet Take 1-2 tablets by mouth every 4 (four) hours as needed for severe pain. (Patient not taking: Reported on 12/24/2017) 10 tablet 0 Not Taking at Unknown time  . meloxicam (MOBIC) 7.5 MG tablet Take 1 tablet (7.5 mg total) by mouth daily. (Patient not taking: Reported on 12/24/2017) 20 tablet 0 Not Taking at Unknown time  . methocarbamol (ROBAXIN) 500 MG tablet Take 1 tablet (500 mg total) by mouth at bedtime as needed for muscle spasms. (Patient not taking: Reported on 12/24/2017) 10 tablet 0 Not Taking at Unknown time  . metroNIDAZOLE (FLAGYL) 500 MG tablet Take 1 tablet (500 mg total) by mouth 2 (two) times daily. (Patient not taking: Reported on 04/23/2017) 28 tablet 0 Not Taking at Unknown time  . oxyCODONE-acetaminophen (PERCOCET/ROXICET) 5-325 MG per tablet Take 1 tablet by mouth every 6 (six) hours as needed for moderate pain or severe pain. (Patient not taking: Reported on 04/13/2014) 12 tablet 0 Not Taking at Unknown time  .  pantoprazole (PROTONIX) 20 MG tablet Take 1 tablet (20 mg total) by mouth 2 (two) times daily. (Patient not taking: Reported on 12/24/2017) 60 tablet 0 Not Taking at Unknown time  . promethazine (PHENERGAN) 25 MG tablet Take 1 tablet (25 mg total) by mouth every 6 (six) hours as needed for nausea or vomiting. (Patient not taking: Reported on 04/13/2014) 12 tablet 0 Not Taking at Unknown time    Review of Systems  Constitutional: Negative for chills and fever.  Gastrointestinal: Positive for abdominal pain. Negative for nausea and vomiting.  Genitourinary: Negative for dysuria and frequency.   Physical Exam   Blood pressure 129/83, pulse 79, temperature 98.4 F (36.9 C), resp. rate 19, height 5\' 1"  (1.549 m), weight  (!) 137.7 kg, SpO2 100 %.  Physical Exam  Nursing note and vitals reviewed. Constitutional: She is oriented to person, place, and time. She appears well-developed and well-nourished. No distress.  HENT:  Head: Normocephalic.  Cardiovascular: Normal rate.  Respiratory: Effort normal.  GI: Soft. There is no tenderness. There is no rebound.  The majority of the patient's panus is diffusely reddened with edema, and warmth.   Neurological: She is alert and oriented to person, place, and time.  Skin: Skin is warm and dry.  Psychiatric: She has a normal mood and affect.   Results for orders placed or performed during the hospital encounter of 01/02/18 (from the past 24 hour(s))  Pregnancy, urine POC     Status: None   Collection Time: 01/02/18  2:02 AM  Result Value Ref Range   Preg Test, Ur NEGATIVE NEGATIVE     MAU Course  Procedures  MDM   Assessment and Plan   1. Cellulitis of abdominal wall    DC home Comfort measures reviewed  RX: keflex TID x 7 days  FU with PCP if sx do not improve      Thressa Sheller 01/02/2018, 2:02 AM

## 2018-01-02 NOTE — Discharge Instructions (Signed)
In late 2019, the [facility] will be moving to the [facility] campus. At that time, the MAU (Maternity Admissions Unit), where you are being seen today, will no longer take care of non-pregnant patients. We strongly encourage you to find a doctor's office before that time, so that you can be seen with any GYN concerns, like vaginal discharge, urinary tract infection, etc.. in a timely manner.  In order to make an office visit more convenient, the Center for [facility] at [facility] will be offering evening hours with same-day appointments, walk-in appointments and scheduled appointments available during this time.  Center for [facility] Hours: Monday - 8am - 7:30 pm with walk-in between 4pm- 7:30 pm Tuesday - 8 am - 5 pm (starting 06/17/17 we will be open late and accepting walk-ins from 4pm - 7:30pm) Wednesday - 8 am - 5 pm (starting 09/17/17 we will be open late and accepting walk-ins from 4pm - 7:30pm) Thursday 8 am - 5 pm (starting 12/18/17 we will be open late and accepting walk-ins from 4pm - 7:30pm) Friday 8 am - 5 pm  For an appointment please call the Center for [facility] @ [facility] at [phone]  For urgent needs, [facility] is also available for management of urgent GYN complaints such as vaginal discharge or urinary tract infections.    Primary care follow up  [facility] (will see you even if you do not have sickle cell): [phone] [facility]: [phone] [facility]: [phone] [facility] [phone]    Cellulitis, Adult Cellulitis is a skin infection. The infected area is usually red and sore. This condition occurs most often in the arms and lower legs. It is very important to get treated for this condition. Follow these instructions at home:  Take over-the-counter and prescription medicines only as told by your doctor.  If you were  prescribed an antibiotic medicine, take it as told by your doctor. Do not stop taking the antibiotic even if you start to feel better.  Drink enough fluid to keep your pee (urine) clear or pale yellow.  Do not touch or rub the infected area.  Raise (elevate) the infected area above the level of your heart while you are sitting or lying down.  Place warm or cold wet cloths (warm or cold compresses) on the infected area. Do this as told by your doctor.  Keep all follow-up visits as told by your doctor. This is important. These visits let your doctor make sure your infection is not getting worse. Contact a doctor if:  You have a fever.  Your symptoms do not get better after 1-2 days of treatment.  Your bone or joint under the infected area starts to hurt after the skin has healed.  Your infection comes back. This can happen in the same area or another area.  You have a swollen bump in the infected area.  You have new symptoms.  You feel ill and also have muscle aches and pains. Get help right away if:  Your symptoms get worse.  You feel very sleepy.  You throw up (vomit) or have watery poop (diarrhea) for a long time.  There are red streaks coming from the infected area.  Your red area gets larger.  Your red area turns darker. This information is not intended to replace advice given to you by your health care provider. Make sure you discuss any questions you have with  your health care provider. Document Released: 08/21/2007 Document Revised: 08/10/2015 Document Reviewed: 01/11/2015 Elsevier Interactive Patient Education  2018 [employer].

## 2018-01-02 NOTE — MAU Note (Signed)
Noticed a bump on the lower part of her stomach.  States it started a couple months ago and comes up and goes away.  Did not receive the referral she needed to follow up with [facility].  LMP 12/24/2017
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
## 2018-04-08 ENCOUNTER — Encounter (HOSPITAL_COMMUNITY): Payer: Self-pay | Admitting: Student

## 2018-04-08 ENCOUNTER — Emergency Department (HOSPITAL_COMMUNITY)
Admission: EM | Admit: 2018-04-08 | Discharge: 2018-04-08 | Disposition: A | Discharge status: Home / Self Care | Payer: Medicaid Other | Attending: Emergency Medicine | Admitting: Emergency Medicine

## 2018-04-08 ENCOUNTER — Emergency Department (HOSPITAL_COMMUNITY): Payer: Medicaid Other

## 2018-04-08 DIAGNOSIS — I10 Essential (primary) hypertension: Secondary | ICD-10-CM | POA: Diagnosis not present

## 2018-04-08 DIAGNOSIS — M7918 Myalgia, other site: Secondary | ICD-10-CM | POA: Diagnosis not present

## 2018-04-08 DIAGNOSIS — Z79899 Other long term (current) drug therapy: Secondary | ICD-10-CM | POA: Diagnosis not present

## 2018-04-08 LAB — POC URINE PREG, ED: Preg Test, Ur: NEGATIVE

## 2018-04-08 MED ORDER — NAPROXEN 250 MG PO TABS
500.0000 mg | ORAL_TABLET | Freq: Once | ORAL | Status: AC
  Administered 2018-04-08: 500 mg via ORAL
  Filled 2018-04-08: qty 2

## 2018-04-08 MED ORDER — NAPROXEN 500 MG PO TABS: 500.0000 mg | ORAL_TABLET | Freq: Two times a day (BID) | ORAL | 0 refills | Status: DC

## 2018-04-08 MED ORDER — METHOCARBAMOL 500 MG PO TABS: 500.0000 mg | ORAL_TABLET | Freq: Three times a day (TID) | ORAL | 0 refills | Status: DC | PRN

## 2018-04-08 NOTE — ED Notes (Signed)
Esignature not available. Pt received discharge instructions/agreeable to discharge.
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
## 2018-04-08 NOTE — ED Notes (Signed)
PA Petrucelli at bedside. 
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
## 2018-04-08 NOTE — ED Notes (Signed)
ED Provider at bedside. 

## 2018-04-10 ENCOUNTER — Emergency Department (HOSPITAL_COMMUNITY)
Admission: EM | Admit: 2018-04-10 | Discharge: 2018-04-10 | Disposition: A | Discharge status: Home / Self Care | Payer: Medicaid Other | Attending: Emergency Medicine | Admitting: Emergency Medicine

## 2018-04-10 ENCOUNTER — Encounter (HOSPITAL_COMMUNITY): Payer: Self-pay | Admitting: Emergency Medicine

## 2018-04-10 DIAGNOSIS — M545 Low back pain, unspecified: Secondary | ICD-10-CM

## 2018-04-10 DIAGNOSIS — Y998 Other external cause status: Secondary | ICD-10-CM | POA: Insufficient documentation

## 2018-04-10 DIAGNOSIS — Y9389 Activity, other specified: Secondary | ICD-10-CM | POA: Diagnosis not present

## 2018-04-10 DIAGNOSIS — M62830 Muscle spasm of back: Secondary | ICD-10-CM | POA: Diagnosis not present

## 2018-04-10 DIAGNOSIS — I10 Essential (primary) hypertension: Secondary | ICD-10-CM | POA: Insufficient documentation

## 2018-04-10 DIAGNOSIS — M542 Cervicalgia: Secondary | ICD-10-CM | POA: Diagnosis present

## 2018-04-10 DIAGNOSIS — M62838 Other muscle spasm: Secondary | ICD-10-CM | POA: Diagnosis not present

## 2018-04-10 MED ORDER — LIDOCAINE 5 % EX PTCH
1.0000 | MEDICATED_PATCH | CUTANEOUS | Status: DC
  Administered 2018-04-10: 1 via TRANSDERMAL
  Filled 2018-04-10: qty 1

## 2018-04-10 MED ORDER — LIDOCAINE 5 % EX PTCH: 1.0000 | MEDICATED_PATCH | CUTANEOUS | 0 refills | Status: AC
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
## 2018-04-10 NOTE — Discharge Instructions (Signed)
Follow up with your primary care doctor.

## 2018-04-10 NOTE — ED Notes (Signed)
Bed: WTR7 Expected date:  Expected time:  Means of arrival:  Comments: 

## 2018-04-10 NOTE — ED Notes (Signed)
Attempted to discharge patient-patient on phone

## 2018-10-20 ENCOUNTER — Ambulatory Visit: Payer: Medicaid Other | Admitting: Family

## 2018-10-20 ENCOUNTER — Encounter: Payer: Self-pay | Admitting: Family

## 2018-10-20 ENCOUNTER — Other Ambulatory Visit: Payer: Self-pay

## 2018-10-20 DIAGNOSIS — R21 Rash and other nonspecific skin eruption: Secondary | ICD-10-CM | POA: Diagnosis not present

## 2018-10-20 DIAGNOSIS — L02212 Cutaneous abscess of back [any part, except buttock]: Secondary | ICD-10-CM

## 2018-10-20 DIAGNOSIS — L02419 Cutaneous abscess of limb, unspecified: Secondary | ICD-10-CM | POA: Diagnosis not present

## 2018-10-20 NOTE — Assessment & Plan Note (Signed)
Most recent available BMI of 57 indicating morbid obesity.  Currently working on potential bariatric surgical interventions and weight loss.  This is likely a significant contributing factor to her multiple lesions and symptoms.  Encouraged continued weight loss and bariatric surgery as indicated.

## 2018-10-20 NOTE — Assessment & Plan Note (Signed)
Rash/skin eruption of the lower abdomen seen on her phone as well as during office visit appears consistent with fungal infection and recommended over the counter clotrimazole cream in a very thin layer to avoid excessive moisture as it is located in her pannus.  Informed it may take several days up to a couple of weeks for rash to resolve completely.  Follow-up with primary care or dermatology if symptoms worsen or do not improve.

## 2018-10-20 NOTE — Assessment & Plan Note (Signed)
Ms. Cashatt has an abscess on her back refractory to doxycycline and Zyvox.  Discussed the need for incision and drainage to obtain source control as it is unlikely further antimicrobial therapy will have any significant improvements.  Would recommend culturing abscess although likely MRSA to determine any additional need for treatment.  Surgical intervention would likely be definitive therapy with no further antibiotic therapy necessary.  Referral to general surgery placed.

## 2018-10-20 NOTE — Patient Instructions (Signed)
Nice to meet you.  It appears that your abscesses will require surgical drainage in order to fully resolve. Additional antibiotics are unlikely to help solve this problem.   Recommend clotriamozle cream for your abdomen. Place a very thin layer of cream on. This is available over the counter.  A referral to general surgery has been sent and you should be hearing from either our office or the surgical office.  Recommend using compression socks to help with the fluid in your legs. This does not appear to be infection or cellulitis at the present time, however having fluid in your legs can increase that risk.  Continue to work with your PCP and follow up here with ID as needed.

## 2018-10-20 NOTE — Progress Notes (Signed)
Subjective:    Patient ID: Vanessa Morton, female    DOB: [birth year], 33 y.o.   MRN: [id number]  Chief Complaint  Patient presents with   Recurrent Skin Infections    HPI:  Vanessa Morton is a 33 y.o. female with previous medical history of obesity, hypertension, gonorrhea, and recurrent abscesses referred for evaluation and treatment of recurrent abscesses and cellulitis.  [name] has been having abscesses for several years located in various locations around her body most recently on her bilateral lower back, right breast, and a rash located around her abdomen.  Initially seen by primary care with recommendation for incision and drainage, however [name] refused at the time wishing to pursue oral therapy and was a started on oral doxycycline.  She completed her doxycycline as prescribed with no significant improvement in her symptoms.  Then transitioned to 600 mg of Zyvox twice daily with goal of 14 days of therapy.  She was only able to complete approximately 10 days due to the adverse side effect of diarrhea.  She continues to have abscesses located on her left lower back at the level of her bra, 1 in the right axilla, and a rash located on her abdomen.  No systemic symptoms of fevers, chills, or sweats.  Previous history of a recurrent lesion located on her upper back requiring surgical incision and drainage.  If incision and drainage is necessary she is wishing to be sedated for the procedure.  No cultures have been obtained today.   No Known Allergies    Outpatient Medications Prior to Visit  Medication Sig Dispense Refill   Buprenorphine HCl-Naloxone HCl 8-2 MG FILM PLACE 1 STRIP UNDER THE TONGUE TWICE DAILY AND 1 2 (ONE HALF) AT BEDTIME     escitalopram (LEXAPRO) 10 MG tablet Take 10 mg by mouth daily.     ibuprofen (ADVIL,MOTRIN) 200 MG tablet Take 600 mg by mouth daily as needed for moderate pain.      losartan (COZAAR) 50 MG tablet      methocarbamol  (ROBAXIN) 500 MG tablet Take 1 tablet (500 mg total) by mouth every 8 (eight) hours as needed for muscle spasms. (Patient not taking: Reported on 10/20/2018) 15 tablet 0   naproxen (NAPROSYN) 500 MG tablet Take 1 tablet (500 mg total) by mouth 2 (two) times daily. (Patient not taking: Reported on 10/20/2018) 10 tablet 0   No facility-administered medications prior to visit.      Past Medical History:  Diagnosis Date   Abscess    Chest pain    Gonorrhea    Hypertension    Obesity    Urinary tract infection       Past Surgical History:  Procedure Laterality Date   CESAREAN SECTION     TONSILLECTOMY        Family History  Problem Relation Age of Onset   Hypertension Mother    Diabetes Mother    Diabetes Other    Hypertension Other       Social History   Socioeconomic History   Marital status: Single    Spouse name: Not on file   Number of children: Not on file   Years of education: Not on file   Highest education level: Not on file  Occupational History   Not on file  Social Needs   Financial resource strain: Not on file   Food insecurity    Worry: Not on file    Inability: Not on file  Transportation needs    Medical: Not on file    Non-medical: Not on file  Tobacco Use   Smoking status: Never Smoker   Smokeless tobacco: Never Used  Substance and Sexual Activity   Alcohol use: No   Drug use: No   Sexual activity: Yes    Birth control/protection: None  Lifestyle   Physical activity    Days per week: Not on file    Minutes per session: Not on file   Stress: Not on file  Relationships   Social connections    Talks on phone: Not on file    Gets together: Not on file    Attends religious service: Not on file    Active member of club or organization: Not on file    Attends meetings of clubs or organizations: Not on file    Relationship status: Not on file   Intimate partner violence    Fear of current or ex partner: Not  on file    Emotionally abused: Not on file    Physically abused: Not on file    Forced sexual activity: Not on file  Other Topics Concern   Not on file  Social History Narrative   Not on file      Review of Systems  Constitutional: Negative for chills and fever.  Respiratory: Negative for chest tightness, shortness of breath and wheezing.   Cardiovascular: Positive for leg swelling. Negative for chest pain.  Gastrointestinal: Negative for abdominal pain, constipation, diarrhea and nausea.  Skin:       Positive for multiple cysts/lesions       Objective:    BP (!) 146/87    Pulse 86    Temp 99.1 F (37.3 C)  Nursing note and vital signs reviewed.  Physical Exam Constitutional:      General: She is not in acute distress.    Appearance: She is well-developed. She is obese.  Cardiovascular:     Rate and Rhythm: Normal rate and regular rhythm.     Heart sounds: Normal heart sounds.  Pulmonary:     Effort: Pulmonary effort is normal.     Breath sounds: Normal breath sounds.  Skin:    General: Skin is warm and dry.     Comments: Lesion of the left mid lateral back approximately quarter sized area of induration appears coming to ahead with no drainage or odor presently.  Secondary nodule/lesion located in the right axilla in the right lower quadrant is firm and tender to the touch with induration about the size of a dime/nickel.  Neurological:     Mental Status: She is alert.  Psychiatric:        Mood and Affect: Mood normal.         Assessment & Plan:   Problem List Items Addressed This Visit      Musculoskeletal and Integument   Rash/skin eruption    Rash/skin eruption of the lower abdomen seen on her phone as well as during office visit appears consistent with fungal infection and recommended over the counter clotrimazole cream in a very thin layer to avoid excessive moisture as it is located in her pannus.  Informed it may take several days up to a couple of  weeks for rash to resolve completely.  Follow-up with primary care or dermatology if symptoms worsen or do not improve.        Other   Abscess of back    [name] has an abscess on her back  refractory to doxycycline and Zyvox.  Discussed the need for incision and drainage to obtain source control as it is unlikely further antimicrobial therapy will have any significant improvements.  Would recommend culturing abscess although likely MRSA to determine any additional need for treatment.  Surgical intervention would likely be definitive therapy with no further antibiotic therapy necessary.  Referral to general surgery placed.      Relevant Orders   Ambulatory referral to General Surgery   Axillary abscess    Smaller axillary abscess located in the right lower quadrant refractory to doxycycline and Zyvox.  Will likely require surgical intervention to obtain definitive cure.  Would recommend cultures during surgical procedure.  Discussed possibility of warm compresses.  Referral placed to general surgery.      Relevant Orders   Ambulatory referral to General Surgery   Morbid obesity (HCC)    Most recent available BMI of 57 indicating morbid obesity.  Currently working on potential bariatric surgical interventions and weight loss.  This is likely a significant contributing factor to her multiple lesions and symptoms.  Encouraged continued weight loss and bariatric surgery as indicated.          I have discontinued Lauretta ChesterLydia D. Kawamoto's naproxen and methocarbamol. I am also having her maintain her ibuprofen, Buprenorphine HCl-Naloxone HCl, escitalopram, and losartan.   Follow-up: As needed   Marcos EkeGreg Racquel Arkin, MSN, FNP-C Nurse Practitioner [facility] Office phone: [phone] Pager: [phone] [facility] Main number: [phone]

## 2018-10-20 NOTE — Assessment & Plan Note (Signed)
Smaller axillary abscess located in the right lower quadrant refractory to doxycycline and Zyvox.  Will likely require surgical intervention to obtain definitive cure.  Would recommend cultures during surgical procedure.  Discussed possibility of warm compresses.  Referral placed to general surgery.

## 2018-12-27 ENCOUNTER — Emergency Department (HOSPITAL_COMMUNITY)
Admission: EM | Admit: 2018-12-27 | Discharge: 2018-12-27 | Disposition: A | Discharge status: Home / Self Care | Payer: Medicaid Other | Attending: Emergency Medicine | Admitting: Emergency Medicine

## 2018-12-27 ENCOUNTER — Other Ambulatory Visit: Payer: Self-pay

## 2018-12-27 DIAGNOSIS — L304 Erythema intertrigo: Secondary | ICD-10-CM | POA: Diagnosis not present

## 2018-12-27 DIAGNOSIS — Z79899 Other long term (current) drug therapy: Secondary | ICD-10-CM | POA: Insufficient documentation

## 2018-12-27 DIAGNOSIS — I10 Essential (primary) hypertension: Secondary | ICD-10-CM | POA: Insufficient documentation

## 2018-12-27 DIAGNOSIS — R21 Rash and other nonspecific skin eruption: Secondary | ICD-10-CM | POA: Diagnosis present

## 2018-12-27 MED ORDER — SODIUM CHLORIDE 0.9% FLUSH: 3.0000 mL | Freq: Once | INTRAVENOUS | Status: DC

## 2018-12-27 NOTE — ED Provider Notes (Signed)
[facility] DEPT Provider Note   CSN: [id number] Arrival date & time: 12/27/18  1329     History   Chief Complaint Chief Complaint  Patient presents with  . Abdominal Pain  . Leg Swelling    HPI Vanessa Morton is a 33 y.o. female.     HPI  33 year old female with abdominal rash.  She states that she has been dealing with this at least months to up to a year.  Is been treated in various ways including oral antibiotics and topical powder.  Symptoms wax and wane.  The last few days she feels like she has had worsened symptoms in her lower abdomen.  Some burning and discomfort.  Seems worse when she bends forward at the waist.  No fevers or chills.  No drainage noted.  Past Medical History:  Diagnosis Date  . Abscess   . Chest pain   . Gonorrhea   . Hypertension   . Obesity   . Urinary tract infection     Patient Active Problem List   Diagnosis Date Noted  . Abscess of back 10/20/2018  . Axillary abscess 10/20/2018  . Morbid obesity ([place]) 10/20/2018  . Rash/skin eruption 10/20/2018    Past Surgical History:  Procedure Laterality Date  . CESAREAN SECTION    . TONSILLECTOMY       OB History   No obstetric history on file.      Home Medications    Prior to Admission medications   Medication Sig Start Date End Date Taking? Authorizing Provider  Buprenorphine HCl-Naloxone HCl 8-2 MG FILM PLACE 1 STRIP UNDER THE TONGUE TWICE DAILY AND 1 2 (ONE HALF) AT BEDTIME 07/02/18   [provider]  escitalopram (LEXAPRO) 10 MG tablet Take 10 mg by mouth daily. 09/29/18   [provider]  ibuprofen (ADVIL,MOTRIN) 200 MG tablet Take 600 mg by mouth daily as needed for moderate pain.     [provider]  losartan (COZAAR) 50 MG tablet  06/26/18   [provider]    Family History Family History  Problem Relation Age of Onset  . Hypertension Mother   . Diabetes Mother   . Diabetes Other   . Hypertension  Other     Social History Social History   Tobacco Use  . Smoking status: Never Smoker  . Smokeless tobacco: Never Used  Substance Use Topics  . Alcohol use: No  . Drug use: No     Allergies   Patient has no known allergies.   Review of Systems Review of Systems  All systems reviewed and negative, other than as noted in HPI.  Physical Exam Updated Vital Signs BP (!) 160/104 (BP Location: Left Arm)   Pulse 63   Temp 98.1 F (36.7 C)   Resp 17   Wt (!) 147.2 kg   SpO2 97%   BMI 61.33 kg/m   Physical Exam Vitals signs and nursing note reviewed.  Constitutional:      General: She is not in acute distress.    Appearance: She is well-developed.     Comments: Morbidly obese  HENT:     Head: Normocephalic and atraumatic.  Eyes:     General:        Right eye: No discharge.        Left eye: No discharge.     Conjunctiva/sclera: Conjunctivae normal.  Neck:     Musculoskeletal: Neck supple.  Cardiovascular:     Rate and Rhythm: Normal  rate and regular rhythm.     Heart sounds: Normal heart sounds. No murmur. No friction rub. No gallop.   Pulmonary:     Effort: Pulmonary effort is normal. No respiratory distress.     Breath sounds: Normal breath sounds.  Abdominal:     General: There is no distension.     Palpations: Abdomen is soft.     Tenderness: There is no abdominal tenderness.  Musculoskeletal:        General: No tenderness.  Skin:    General: Skin is warm and dry.     Findings: Rash present.     Comments: Faint erythematous rash beneath pannus with somewhat macerated appearance.  Chronic appearing skin changes below bilateral breasts.  The skin there is mildly thickened and darker in color than the adjacent skin beyond the breast fold.  Neurological:     Mental Status: She is alert.  Psychiatric:        Behavior: Behavior normal.        Thought Content: Thought content normal.      ED Treatments / Results  Labs (all labs ordered are listed, but  only abnormal results are displayed) Labs Reviewed - No data to display  EKG None  Radiology No results found.  Procedures Procedures (including critical care time)  Medications Ordered in ED Medications  sodium chloride flush (NS) 0.9 % injection 3 mL (has no administration in time range)     Initial Impression / Assessment and Plan / ED Course  I have reviewed the triage vital signs and the nursing notes.  Pertinent labs & imaging results that were available during my care of the patient were reviewed by me and considered in my medical decision making (see chart for details).        33yF with with what is likely intertrigo. She has been dealing with this for months. Currently it doesn't look too bad. Underneath her breasts looks like more chronic/healed areas. Some mild active inflammation underneath her panus. From her description she has already been using some type of prescribed medicated powder (nystatin?). She says she has also been on oral abx previously. I do not think that it warranted currently. Advised to keep areas clean/dry the best she can. Pat the areas dry after she bathes and use a hair dryer to dry further. Wear loose fitting clothing or even minimize given the privacy constraints she may have at home. She is trying to lose weight. Encouraged her in this as it will be the most beneficial thing for her long term in regards to this as well as a myriad of other potential health issues.  She is concerned about possible bacteremia, but clinically have extremely low suspicion.  Exam is consistent with mild superficial involvement only.  Final Clinical Impressions(s) / ED Diagnoses   Final diagnoses:  Intertrigo    ED Discharge Orders    None       Virgel Manifold, MD 12/28/18 [phone]

## 2018-12-27 NOTE — ED Triage Notes (Signed)
Pt reports that's had infection on her stomach for several months and been on many antibiotics for it. Reports her skin is red and black. Pt adds bilat leg swelling for same time frame as well. Reports not currently on antibiotics.

## 2018-12-27 NOTE — ED Notes (Signed)
Merle, RN aware of bp previously charted. Pt ambulated to BR to provide urine specimen.

## 2019-01-21 ENCOUNTER — Ambulatory Visit: Payer: Self-pay | Admitting: General Surgery

## 2019-03-01 ENCOUNTER — Other Ambulatory Visit (HOSPITAL_COMMUNITY)
Admission: RE | Admit: 2019-03-01 | Discharge: 2019-03-01 | Disposition: A | Discharge status: Home / Self Care | Payer: Medicaid Other | Source: Ambulatory Visit | Attending: General Surgery | Admitting: General Surgery

## 2019-03-01 DIAGNOSIS — Z20828 Contact with and (suspected) exposure to other viral communicable diseases: Secondary | ICD-10-CM | POA: Diagnosis not present

## 2019-03-01 DIAGNOSIS — Z01812 Encounter for preprocedural laboratory examination: Secondary | ICD-10-CM | POA: Insufficient documentation

## 2019-03-02 LAB — NOVEL CORONAVIRUS, NAA (HOSP ORDER, SEND-OUT TO REF LAB; TAT 18-24 HRS): SARS-CoV-2, NAA: NOT DETECTED

## 2019-03-03 ENCOUNTER — Other Ambulatory Visit: Payer: Self-pay

## 2019-03-03 ENCOUNTER — Encounter (HOSPITAL_COMMUNITY): Payer: Self-pay | Admitting: General Surgery

## 2019-03-03 MED ORDER — VANCOMYCIN HCL 1500 MG/300ML IV SOLN
1500.0000 mg | INTRAVENOUS | Status: AC
  Administered 2019-03-04: 1500 mg via INTRAVENOUS
  Filled 2019-03-03: qty 300

## 2019-03-03 NOTE — Progress Notes (Signed)
Anesthesia Chart Review: Hx of frequent ED visits for atypical chest pain dating back to at least 2013, most recently recently 07/02/17. Workups have been benign, negative enzymes, EKG without ischemic changes.   Last A1c per care everywhere 7.7 02/01/2019.  Last EKG 07/02/17: Sinus rhythm. Rate 72. Borderline T abnormalities, anterior leads.  Will need DOS labs and eval.   TTE 2013 (pt possibly seen at [facility] around this time but Sadie Haber was unable to locate any records on her): Left ventricle: Wall thickness was increased in a pattern  of mild LVH. Systolic function was mildly reduced. The  estimated ejection fraction was in the range of 45% to 50%.  Wall motion was normal; there were no regional wall motion  abnormalities. Doppler parameters are consistent with  abnormal left ventricular relaxation (grade 1 diastolic  dysfunction).    Vanessa Morton [facility] Stay [facility]/Anesthesiology Phone [phone] 03/03/2019 1:32 PM

## 2019-03-03 NOTE — Anesthesia Preprocedure Evaluation (Addendum)
Anesthesia Evaluation  Patient identified by MRN, date of birth, ID band Patient awake    Reviewed: Allergy & Precautions, NPO status , Patient's Chart, lab work & pertinent test results  Airway Mallampati: II  TM Distance: >3 FB     Dental   Pulmonary    breath sounds clear to auscultation       Cardiovascular hypertension,  Rhythm:Regular Rate:Normal     Neuro/Psych    GI/Hepatic negative GI ROS, Neg liver ROS,   Endo/Other  negative endocrine ROS  Renal/GU negative Renal ROS     Musculoskeletal   Abdominal   Peds  Hematology   Anesthesia Other Findings   Reproductive/Obstetrics                            Anesthesia Physical Anesthesia Plan  ASA: III  Anesthesia Plan: General   Post-op Pain Management:    Induction: Intravenous  PONV Risk Score and Plan: 3 and Ondansetron, Dexamethasone and Midazolam  Airway Management Planned: Oral ETT  Additional Equipment:   Intra-op Plan:   Post-operative Plan: Extubation in OR  Informed Consent: I have reviewed the patients History and Physical, chart, labs and discussed the procedure including the risks, benefits and alternatives for the proposed anesthesia with the patient or authorized representative who has indicated his/her understanding and acceptance.     Dental advisory given  Plan Discussed with: CRNA and Anesthesiologist  Anesthesia Plan Comments: (Hx of frequent ED visits for atypical chest pain dating back to at least 2013, most recently recently 07/02/17. Workups have been benign, negative enzymes, EKG without ischemic changes.   Last A1c per care everywhere 7.7 02/01/2019.  Last EKG 07/02/17: Sinus rhythm. Rate 72. Borderline T abnormalities, anterior leads.  Will need DOS labs and eval.   TTE 2013 (pt possibly seen at [facility] around this time but Sadie Haber was unable to locate any records on her): Left  ventricle: Wall thickness was increased in a pattern  of mild LVH. Systolic function was mildly reduced. The  estimated ejection fraction was in the range of 45% to 50%.  Wall motion was normal; there were no regional wall motion  abnormalities. Doppler parameters are consistent with  abnormal left ventricular relaxation (grade 1 diastolic  dysfunction). )   Anesthesia Quick Evaluation

## 2019-03-04 ENCOUNTER — Encounter (HOSPITAL_COMMUNITY)
Admission: RE | Disposition: A | Discharge status: Home / Self Care | Payer: Self-pay | Source: Home / Self Care | Attending: General Surgery

## 2019-03-04 ENCOUNTER — Other Ambulatory Visit: Payer: Self-pay

## 2019-03-04 ENCOUNTER — Ambulatory Visit (HOSPITAL_COMMUNITY): Payer: Medicaid Other | Admitting: Physician Assistant

## 2019-03-04 ENCOUNTER — Encounter (HOSPITAL_COMMUNITY): Payer: Self-pay | Admitting: General Surgery

## 2019-03-04 ENCOUNTER — Ambulatory Visit (HOSPITAL_COMMUNITY)
Admission: RE | Admit: 2019-03-04 | Discharge: 2019-03-04 | Disposition: A | Discharge status: Home / Self Care | Payer: Medicaid Other | Attending: General Surgery | Admitting: General Surgery

## 2019-03-04 DIAGNOSIS — L732 Hidradenitis suppurativa: Secondary | ICD-10-CM | POA: Insufficient documentation

## 2019-03-04 DIAGNOSIS — I10 Essential (primary) hypertension: Secondary | ICD-10-CM | POA: Insufficient documentation

## 2019-03-04 DIAGNOSIS — Z79899 Other long term (current) drug therapy: Secondary | ICD-10-CM | POA: Diagnosis not present

## 2019-03-04 HISTORY — DX: Anxiety disorder, unspecified: F41.9

## 2019-03-04 HISTORY — PX: HYDRADENITIS EXCISION: SHX5243

## 2019-03-04 LAB — BASIC METABOLIC PANEL
Anion gap: 11 (ref 5–15)
BUN: 15 mg/dL (ref 6–20)
CO2: 18 mmol/L — ABNORMAL LOW (ref 22–32)
Calcium: 8.9 mg/dL (ref 8.9–10.3)
Chloride: 108 mmol/L (ref 98–111)
Creatinine, Ser: 0.77 mg/dL (ref 0.44–1.00)
GFR calc Af Amer: >60 mL/min (ref >60)
GFR calc non Af Amer: >60 mL/min (ref >60)
Glucose, Bld: 138 mg/dL — ABNORMAL HIGH (ref 70–99)
Potassium: 4.6 mmol/L (ref 3.5–5.1)
Sodium: 137 mmol/L (ref 135–145)

## 2019-03-04 LAB — CBC
HCT: 37.4 % (ref 36.0–46.0)
Hemoglobin: 11.4 g/dL — ABNORMAL LOW (ref 12.0–15.0)
MCH: 27.3 pg (ref 26.0–34.0)
MCHC: 30.5 g/dL (ref 30.0–36.0)
MCV: 89.7 fL (ref 80.0–100.0)
Platelets: 325 10*3/uL (ref 150–400)
RBC: 4.17 MIL/uL (ref 3.87–5.11)
RDW: 14.6 % (ref 11.5–15.5)
WBC: 9.9 10*3/uL (ref 4.0–10.5)
nRBC: 0 % (ref 0.0–0.2)

## 2019-03-04 LAB — POCT PREGNANCY, URINE: Preg Test, Ur: NEGATIVE

## 2019-03-04 SURGERY — EXCISION, HIDRADENITIS, AXILLA
Anesthesia: General | Site: Back

## 2019-03-04 MED ORDER — SUCCINYLCHOLINE CHLORIDE 200 MG/10ML IV SOSY
PREFILLED_SYRINGE | INTRAVENOUS | Status: AC
  Filled 2019-03-04: qty 10

## 2019-03-04 MED ORDER — PHENYLEPHRINE 40 MCG/ML (10ML) SYRINGE FOR IV PUSH (FOR BLOOD PRESSURE SUPPORT)
PREFILLED_SYRINGE | INTRAVENOUS | Status: DC | PRN
  Administered 2019-03-04: 80 ug via INTRAVENOUS
  Administered 2019-03-04 (×2): 40 ug via INTRAVENOUS

## 2019-03-04 MED ORDER — 0.9 % SODIUM CHLORIDE (POUR BTL) OPTIME
TOPICAL | Status: DC | PRN
  Administered 2019-03-04: 1000 mL

## 2019-03-04 MED ORDER — FENTANYL CITRATE (PF) 250 MCG/5ML IJ SOLN
INTRAMUSCULAR | Status: AC
  Filled 2019-03-04: qty 5

## 2019-03-04 MED ORDER — DEXAMETHASONE SODIUM PHOSPHATE 10 MG/ML IJ SOLN
INTRAMUSCULAR | Status: DC | PRN
  Administered 2019-03-04: 10 mg via INTRAVENOUS

## 2019-03-04 MED ORDER — PROPOFOL 10 MG/ML IV BOLUS
INTRAVENOUS | Status: DC | PRN
  Administered 2019-03-04: 200 mg via INTRAVENOUS
  Administered 2019-03-04: 30 mg via INTRAVENOUS

## 2019-03-04 MED ORDER — FENTANYL CITRATE (PF) 100 MCG/2ML IJ SOLN
INTRAMUSCULAR | Status: AC
  Administered 2019-03-04: 50 ug via INTRAVENOUS
  Filled 2019-03-04: qty 2

## 2019-03-04 MED ORDER — LIDOCAINE 2% (20 MG/ML) 5 ML SYRINGE
INTRAMUSCULAR | Status: DC | PRN
  Administered 2019-03-04: 100 mg via INTRAVENOUS

## 2019-03-04 MED ORDER — ROCURONIUM BROMIDE 10 MG/ML (PF) SYRINGE
PREFILLED_SYRINGE | INTRAVENOUS | Status: DC | PRN
  Administered 2019-03-04: 50 mg via INTRAVENOUS
  Administered 2019-03-04: 20 mg via INTRAVENOUS

## 2019-03-04 MED ORDER — ACETAMINOPHEN 500 MG PO TABS
ORAL_TABLET | ORAL | Status: AC
  Administered 2019-03-04: 1000 mg via ORAL
  Filled 2019-03-04: qty 2

## 2019-03-04 MED ORDER — HYDROCODONE-ACETAMINOPHEN 5-325 MG PO TABS: 1.0000 | ORAL_TABLET | Freq: Four times a day (QID) | ORAL | 0 refills | Status: DC | PRN

## 2019-03-04 MED ORDER — ACETAMINOPHEN 500 MG PO TABS: 1000.0000 mg | ORAL_TABLET | ORAL | Status: AC

## 2019-03-04 MED ORDER — ONDANSETRON HCL 4 MG/2ML IJ SOLN
INTRAMUSCULAR | Status: AC
  Filled 2019-03-04: qty 2

## 2019-03-04 MED ORDER — FENTANYL CITRATE (PF) 100 MCG/2ML IJ SOLN
25.0000 ug | INTRAMUSCULAR | Status: DC | PRN
  Administered 2019-03-04: 50 ug via INTRAVENOUS

## 2019-03-04 MED ORDER — CHLORHEXIDINE GLUCONATE CLOTH 2 % EX PADS: 6.0000 | MEDICATED_PAD | Freq: Once | CUTANEOUS | Status: DC

## 2019-03-04 MED ORDER — FENTANYL CITRATE (PF) 250 MCG/5ML IJ SOLN
INTRAMUSCULAR | Status: DC | PRN
  Administered 2019-03-04 (×8): 50 ug via INTRAVENOUS
  Administered 2019-03-04: 100 ug via INTRAVENOUS

## 2019-03-04 MED ORDER — SUGAMMADEX SODIUM 500 MG/5ML IV SOLN
INTRAVENOUS | Status: AC
  Filled 2019-03-04: qty 5

## 2019-03-04 MED ORDER — BUPIVACAINE-EPINEPHRINE 0.5% -1:200000 IJ SOLN
INTRAMUSCULAR | Status: AC
  Filled 2019-03-04: qty 1

## 2019-03-04 MED ORDER — ROCURONIUM BROMIDE 10 MG/ML (PF) SYRINGE
PREFILLED_SYRINGE | INTRAVENOUS | Status: AC
  Filled 2019-03-04: qty 10

## 2019-03-04 MED ORDER — LACTATED RINGERS IV SOLN: INTRAVENOUS | Status: DC

## 2019-03-04 MED ORDER — MIDAZOLAM HCL 5 MG/5ML IJ SOLN
INTRAMUSCULAR | Status: DC | PRN
  Administered 2019-03-04: 2 mg via INTRAVENOUS

## 2019-03-04 MED ORDER — SUGAMMADEX SODIUM 200 MG/2ML IV SOLN
INTRAVENOUS | Status: DC | PRN
  Administered 2019-03-04: 300 mg via INTRAVENOUS

## 2019-03-04 MED ORDER — GABAPENTIN 300 MG PO CAPS
ORAL_CAPSULE | ORAL | Status: AC
  Administered 2019-03-04: 300 mg via ORAL
  Filled 2019-03-04: qty 1

## 2019-03-04 MED ORDER — LIDOCAINE 2% (20 MG/ML) 5 ML SYRINGE
INTRAMUSCULAR | Status: AC
  Filled 2019-03-04: qty 5

## 2019-03-04 MED ORDER — CELECOXIB 200 MG PO CAPS: 200.0000 mg | ORAL_CAPSULE | ORAL | Status: AC

## 2019-03-04 MED ORDER — PROPOFOL 10 MG/ML IV BOLUS
INTRAVENOUS | Status: AC
  Filled 2019-03-04: qty 40

## 2019-03-04 MED ORDER — DEXAMETHASONE SODIUM PHOSPHATE 10 MG/ML IJ SOLN
INTRAMUSCULAR | Status: AC
  Filled 2019-03-04: qty 1

## 2019-03-04 MED ORDER — ONDANSETRON HCL 4 MG/2ML IJ SOLN
INTRAMUSCULAR | Status: DC | PRN
  Administered 2019-03-04: 4 mg via INTRAVENOUS

## 2019-03-04 MED ORDER — GABAPENTIN 300 MG PO CAPS: 300.0000 mg | ORAL_CAPSULE | ORAL | Status: AC

## 2019-03-04 MED ORDER — SUCCINYLCHOLINE CHLORIDE 200 MG/10ML IV SOSY
PREFILLED_SYRINGE | INTRAVENOUS | Status: DC | PRN
  Administered 2019-03-04: 160 mg via INTRAVENOUS

## 2019-03-04 MED ORDER — MIDAZOLAM HCL 2 MG/2ML IJ SOLN
INTRAMUSCULAR | Status: AC
  Filled 2019-03-04: qty 2

## 2019-03-04 MED ORDER — BUPIVACAINE-EPINEPHRINE (PF) 0.5% -1:200000 IJ SOLN
INTRAMUSCULAR | Status: DC | PRN
  Administered 2019-03-04: 30 mL

## 2019-03-04 MED ORDER — CELECOXIB 200 MG PO CAPS
ORAL_CAPSULE | ORAL | Status: AC
  Administered 2019-03-04: 200 mg via ORAL
  Filled 2019-03-04: qty 1

## 2019-03-04 SURGICAL SUPPLY — 34 items
BENZOIN TINCTURE PRP APPL 2/3 (GAUZE/BANDAGES/DRESSINGS) IMPLANT
BNDG GAUZE ELAST 4 BULKY (GAUZE/BANDAGES/DRESSINGS) IMPLANT
CHLORAPREP W/TINT 26 (MISCELLANEOUS) ×2 IMPLANT
COVER SURGICAL LIGHT HANDLE (MISCELLANEOUS) ×2 IMPLANT
COVER WAND RF STERILE (DRAPES) ×2 IMPLANT
DECANTER SPIKE VIAL GLASS SM (MISCELLANEOUS) ×2 IMPLANT
DERMABOND ADVANCED (GAUZE/BANDAGES/DRESSINGS) ×3
DERMABOND ADVANCED .7 DNX12 (GAUZE/BANDAGES/DRESSINGS) IMPLANT
DRAPE LAPAROTOMY 100X72 PEDS (DRAPES) ×2 IMPLANT
ELECT REM PT RETURN 9FT ADLT (ELECTROSURGICAL) ×2
ELECTRODE REM PT RTRN 9FT ADLT (ELECTROSURGICAL) ×1 IMPLANT
GAUZE SPONGE 4X4 12PLY STRL (GAUZE/BANDAGES/DRESSINGS) IMPLANT
GLOVE BIO SURGEON STRL SZ7.5 (GLOVE) ×2 IMPLANT
GOWN STRL REUS W/ TWL LRG LVL3 (GOWN DISPOSABLE) ×2 IMPLANT
GOWN STRL REUS W/TWL LRG LVL3 (GOWN DISPOSABLE) ×2
KIT BASIN OR (CUSTOM PROCEDURE TRAY) ×2 IMPLANT
KIT TURNOVER KIT B (KITS) ×2 IMPLANT
NDL HYPO 25GX1X1/2 BEV (NEEDLE) ×1 IMPLANT
NEEDLE HYPO 25GX1X1/2 BEV (NEEDLE) ×2 IMPLANT
NS IRRIG 1000ML POUR BTL (IV SOLUTION) ×2 IMPLANT
PACK GENERAL/GYN (CUSTOM PROCEDURE TRAY) ×2 IMPLANT
PAD ARMBOARD 7.5X6 YLW CONV (MISCELLANEOUS) ×2 IMPLANT
PENCIL SMOKE EVACUATOR (MISCELLANEOUS) ×2 IMPLANT
STRIP CLOSURE SKIN 1/4X4 (GAUZE/BANDAGES/DRESSINGS) IMPLANT
SUT ETHILON 3 0 FSL (SUTURE) IMPLANT
SUT MNCRL AB 4-0 PS2 18 (SUTURE) ×1 IMPLANT
SUT VIC AB 2-0 SH 18 (SUTURE) ×1 IMPLANT
SUT VIC AB 3-0 SH 27 (SUTURE)
SUT VIC AB 3-0 SH 27XBRD (SUTURE) IMPLANT
SYR CONTROL 10ML LL (SYRINGE) ×2 IMPLANT
TOWEL GREEN STERILE (TOWEL DISPOSABLE) ×2 IMPLANT
TOWEL GREEN STERILE FF (TOWEL DISPOSABLE) ×2 IMPLANT
TUBE CONNECTING 12X1/4 (SUCTIONS) ×1 IMPLANT
YANKAUER SUCT BULB TIP NO VENT (SUCTIONS) ×1 IMPLANT

## 2019-03-04 NOTE — Transfer of Care (Signed)
Immediate Anesthesia Transfer of Care Note  Patient: Vanessa Morton  Procedure(s) Performed: EXCISION HIDRADENITIS RIGHT AXILLA X2 AND BACK X2 (N/A Back)  Patient Location: PACU  Anesthesia Type:General  Level of Consciousness: awake, alert  and oriented  Airway & Oxygen Therapy: Patient Spontanous Breathing  Post-op Assessment: Report given to RN, Post -op Vital signs reviewed and stable and Patient moving all extremities X 4  Post vital signs: Reviewed and stable  Last Vitals:  Vitals Value Taken Time  BP 131/65 03/04/19 1223  Temp    Pulse 103 03/04/19 1223  Resp 21 03/04/19 1223  SpO2 100 % 03/04/19 1223  Vitals shown include unvalidated device data.  Last Pain:  Vitals:   03/04/19 0855  PainSc: 0-No pain         Complications: No apparent anesthesia complications

## 2019-03-04 NOTE — Anesthesia Procedure Notes (Signed)
Procedure Name: Intubation Date/Time: 03/04/2019 10:49 AM Performed by: Harden Mo, CRNA Pre-anesthesia Checklist: Patient identified, Emergency Drugs available, Suction available and Patient being monitored Patient Re-evaluated:Patient Re-evaluated prior to induction Oxygen Delivery Method: Circle System Utilized Preoxygenation: Pre-oxygenation with 100% oxygen Induction Type: IV induction and Rapid sequence Laryngoscope Size: Miller and 2 Grade View: Grade I Tube type: Oral Tube size: 7.5 mm Number of attempts: 1 Airway Equipment and Method: Stylet and Oral airway Placement Confirmation: ETT inserted through vocal cords under direct vision,  positive ETCO2 and breath sounds checked- equal and bilateral Secured at: 23 cm Tube secured with: Tape Dental Injury: Teeth and Oropharynx as per pre-operative assessment

## 2019-03-04 NOTE — Anesthesia Postprocedure Evaluation (Signed)
Anesthesia Post Note  Patient: Vanessa Morton  Procedure(s) Performed: EXCISION HIDRADENITIS RIGHT AXILLA X2 AND BACK X2 (N/A Back)     Patient location during evaluation: PACU Anesthesia Type: General Level of consciousness: awake Pain management: pain level controlled Vital Signs Assessment: post-procedure vital signs reviewed and stable Respiratory status: spontaneous breathing Cardiovascular status: stable Postop Assessment: no apparent nausea or vomiting Anesthetic complications: no    Last Vitals:  Vitals:   03/04/19 1255 03/04/19 1303  BP: 135/75 133/76  Pulse: 83 80  Resp: 17 14  Temp:  (!) 36.3 C  SpO2: 100% 100%    Last Pain:  Vitals:   03/04/19 1253  PainSc: 7                  Shriyan Arakawa

## 2019-03-04 NOTE — H&P (Signed)
Vanessa Morton  Location: [facility] Patient #: [id number] DOB: 03/30/1985 Single / Language: [place] / Race: Black or African American Female   History of Present Illness  The patient is a 33 year old female who presents with a complaint of Hidradenitis. We are asked to see the patient in consultation by Dr. Jeanine Luz to evaluate her for abscesses. The patient is a 33 year old black female who has had problems with painful draining areas in the skin of her right axilla and both sides of her mid back. She denies any current fevers or chills. She has had some drainage intermittently at times. She has had multiple areas like this incised and drained with a constantly recur.   Allergies  No Known Drug Allergies   Medication History  Suboxone (8-2MG  Film, Sublingual) Active. Escitalopram Oxalate (10MG  Tablet, Oral) Active. Losartan Potassium (50MG  Tablet, Oral) Active. Medications Reconciled  Social History  Tobacco use  Never smoker.    Review of Systems  General Not Present- Appetite Loss, Chills, Fatigue, Fever, Night Sweats, Weight Gain and Weight Loss. Note: All other systems negative (unless as noted in HPI & included Review of Systems) Skin Not Present- Change in Wart/Mole, Dryness, Hives, Jaundice, New Lesions, Non-Healing Wounds, Rash and Ulcer. HEENT Not Present- Earache, Hearing Loss, Hoarseness, Nose Bleed, Oral Ulcers, Ringing in the Ears, Seasonal Allergies, Sinus Pain, Sore Throat, Visual Disturbances, Wears glasses/contact lenses and Yellow Eyes. Respiratory Not Present- Bloody sputum, Chronic Cough, Difficulty Breathing, Snoring and Wheezing. Breast Not Present- Breast Mass, Breast Pain, Nipple Discharge and Skin Changes. Cardiovascular Not Present- Chest Pain, Difficulty Breathing Lying Down, Leg Cramps, Palpitations, Rapid Heart Rate, Shortness of Breath and Swelling of Extremities. Gastrointestinal Not Present- Abdominal Pain, Bloating,  Bloody Stool, Change in Bowel Habits, Chronic diarrhea, Constipation, Difficulty Swallowing, Excessive gas, Gets full quickly at meals, Hemorrhoids, Indigestion, Nausea, Rectal Pain and Vomiting. Female Genitourinary Not Present- Frequency, Nocturia, Painful Urination, Pelvic Pain and Urgency. Musculoskeletal Not Present- Back Pain, Joint Pain, Joint Stiffness, Muscle Pain, Muscle Weakness and Swelling of Extremities. Neurological Not Present- Decreased Memory, Fainting, Headaches, Numbness, Seizures, Tingling, Tremor, Trouble walking and Weakness. Psychiatric Not Present- Anxiety, Bipolar, Change in Sleep Pattern, Depression, Fearful and Frequent crying. Endocrine Not Present- Cold Intolerance, Excessive Hunger, Hair Changes, Heat Intolerance, Hot flashes and New Diabetes. Hematology Not Present- Easy Bruising, Excessive bleeding, Gland problems, HIV and Persistent Infections.  Vitals  Weight: 311.4 lb Height: 61in Body Surface Area: 2.28 m Body Mass Index: 58.84 kg/m  Temp.: 98.27F  Pulse: 94 (Regular)  BP: 130/90 (Sitting, Left Arm, Standard)       Physical Exam  General Mental Status-Alert. General Appearance-Consistent with stated age. Hydration-Well hydrated. Voice-Normal. Note: Morbidly obese   Integumentary Note: There are 2 hard areas of tenderness in the right axilla. There are also 2 hard areas of tenderness in the folds of the mid back on each side. No current drainage from any of these areas.   Head and Neck Head-normocephalic, atraumatic with no lesions or palpable masses. Trachea-midline. Thyroid Gland Characteristics - normal size and consistency.  Eye Eyeball - Bilateral-Extraocular movements intact. Sclera/Conjunctiva - Bilateral-No scleral icterus.  Chest and Lung Exam Chest and lung exam reveals -quiet, even and easy respiratory effort with no use of accessory muscles and on auscultation, normal breath sounds, no  adventitious sounds and normal vocal resonance. Inspection Chest Wall - Normal. Back - normal.  Cardiovascular Cardiovascular examination reveals -normal heart sounds, regular rate and rhythm with no murmurs and  normal pedal pulses bilaterally.  Abdomen Inspection Inspection of the abdomen reveals - No Hernias. Skin - Scar - no surgical scars. Palpation/Percussion Palpation and Percussion of the abdomen reveal - Soft, Non Tender, No Rebound tenderness, No Rigidity (guarding) and No hepatosplenomegaly. Auscultation Auscultation of the abdomen reveals - Bowel sounds normal. Note: There are some peau d'orange changes of the lower abdominal wall. No cellulitis   Neurologic Neurologic evaluation reveals -alert and oriented x 3 with no impairment of recent or remote memory. Mental Status-Normal.  Musculoskeletal Normal Exam - Left-Upper Extremity Strength Normal and Lower Extremity Strength Normal. Normal Exam - Right-Upper Extremity Strength Normal and Lower Extremity Strength Normal.  Lymphatic Head & Neck  General Head & Neck Lymphatics: Bilateral - Description - Normal. Axillary  General Axillary Region: Bilateral - Description - Normal. Tenderness - Non Tender. Femoral & Inguinal  Generalized Femoral & Inguinal Lymphatics: Bilateral - Description - Normal. Tenderness - Non Tender.    Assessment & Plan HIDRADENITIS SUPPURATIVA OF RIGHT AXILLA (L73.2) Impression: The patient appears to have areas of hidradenitis in her right axilla as well as the folds of her back. Because of the recurrent nature of the infections I think she may benefit from having these areas excised. She would also like to have this done. I have discussed with her in detail the risks and benefits of the operation as well as some of the technical aspects including the possibility of having to leave the wounds open and she understands and wishes to proceed. It also appears as though she may have  evidence of panniculitis. I think this would be significantly improved with weight loss and consultation with plastic surgery. Current Plans Pt Education - Hidradenitis Suppurativa: discussed with patient and provided information.

## 2019-03-04 NOTE — Op Note (Signed)
03/04/2019  12:14 PM  PATIENT:  Vanessa Morton  33 y.o. female  PRE-OPERATIVE DIAGNOSIS:  HIDRADENITIS BACK AND RIGHT AXILLA  POST-OPERATIVE DIAGNOSIS:  HIDRADENITIS BACK AND RIGHT AXILLA  PROCEDURE:  Procedure(s): EXCISION HIDRADENITIS RIGHT AXILLA AND BACK X2 (N/A)  SURGEON:  Surgeon(s) and Role:    * Jovita Kussmaul, MD - Primary  PHYSICIAN ASSISTANT:   ASSISTANTS: none   ANESTHESIA:   local and general  EBL:  20 mL   BLOOD ADMINISTERED:none  DRAINS: none   LOCAL MEDICATIONS USED:  MARCAINE     SPECIMEN:  Source of Specimen:  hidradenitis back x 2 and right axilla  DISPOSITION OF SPECIMEN:  PATHOLOGY  COUNTS:  YES  TOURNIQUET:  * No tourniquets in log *  DICTATION: .Dragon Dictation   After informed consent was obtained the patient was brought to the operating room and left in the supine position on the stretcher.  After adequate induction of general anesthesia the patient was moved into a prone position on the operating room table and all pressure points were padded.  An appropriate timeout was performed.  The back was prepped with ChloraPrep, allowed to dry, and draped in usual sterile manner.  She had 2 areas of hidradenitis one on each side.  Each of these areas was infiltrated with quarter percent Marcaine.  Elliptical incisions were made in the skin overlying each of these areas.  The incision was carried through the skin and subcutaneous tissue sharply with electrocautery until the entire area of hidradenitis was removed.  Hemostasis was achieved using the Bovie electrocautery.  The deep layer of each wound was closed with interrupted 2-0 Vicryl stitches.  The skin incisions were closed with running 4-0 Monocryl subcuticular stitches.  Dermabond dressings were applied.  The patient was then moved into a supine position on the operating table.  The right axilla was then prepped with ChloraPrep, allowed to dry, and draped in usual sterile manner.  Another timeout was  performed.  The area around the hidradenitis high in the right axilla was then infiltrated with quarter percent Marcaine.  An elliptical incision was made in the skin around the area of hidradenitis with a 15 blade knife.  The incision was carried through the skin and subcutaneous tissue sharply with electrocautery until the entire area of hidradenitis that I could identify was removed.  Hemostasis was achieved using the Bovie electrocautery.  The incision was then closed with interrupted 4-0 Monocryl subcuticular stitches.  Dermabond dressings were applied.  The patient tolerated the procedure well.  At the end of the case all needle sponge and instrument counts were correct.  The patient was then awakened and taken to recovery in stable condition.  Each of the areas measured approximately 1 x 6 cm.  PLAN OF CARE: Discharge to home after PACU  PATIENT DISPOSITION:  PACU - hemodynamically stable.   Delay start of Pharmacological VTE agent (>24hrs) due to surgical blood loss or risk of bleeding: not applicable

## 2019-03-08 LAB — SURGICAL PATHOLOGY

## 2019-05-11 ENCOUNTER — Encounter (HOSPITAL_COMMUNITY): Payer: Self-pay | Admitting: *Deleted

## 2019-05-11 ENCOUNTER — Other Ambulatory Visit: Payer: Self-pay

## 2019-05-11 ENCOUNTER — Emergency Department (HOSPITAL_COMMUNITY)
Admission: EM | Admit: 2019-05-11 | Discharge: 2019-05-11 | Disposition: A | Discharge status: Home / Self Care | Payer: Medicaid Other | Attending: Emergency Medicine | Admitting: Emergency Medicine

## 2019-05-11 DIAGNOSIS — R55 Syncope and collapse: Secondary | ICD-10-CM | POA: Diagnosis not present

## 2019-05-11 DIAGNOSIS — Z5321 Procedure and treatment not carried out due to patient leaving prior to being seen by health care provider: Secondary | ICD-10-CM | POA: Insufficient documentation

## 2019-05-11 LAB — CBG MONITORING, ED: Glucose-Capillary: 161 mg/dL — ABNORMAL HIGH (ref 70–99)

## 2019-05-11 MED ORDER — SODIUM CHLORIDE 0.9% FLUSH: 3.0000 mL | Freq: Once | INTRAVENOUS | Status: DC

## 2019-05-11 NOTE — ED Notes (Signed)
PT request to have labs draw once in back

## 2019-05-11 NOTE — ED Notes (Signed)
I attempted to collect labs and was unsuccessful. 
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
## 2019-07-31 ENCOUNTER — Emergency Department (HOSPITAL_COMMUNITY): Payer: Medicaid Other

## 2019-07-31 ENCOUNTER — Other Ambulatory Visit: Payer: Self-pay

## 2019-07-31 ENCOUNTER — Encounter (HOSPITAL_COMMUNITY): Payer: Self-pay | Admitting: Emergency Medicine

## 2019-07-31 ENCOUNTER — Emergency Department (HOSPITAL_COMMUNITY)
Admission: EM | Admit: 2019-07-31 | Discharge: 2019-07-31 | Disposition: A | Discharge status: Home / Self Care | Payer: Medicaid Other | Attending: Emergency Medicine | Admitting: Emergency Medicine

## 2019-07-31 DIAGNOSIS — R109 Unspecified abdominal pain: Secondary | ICD-10-CM | POA: Diagnosis not present

## 2019-07-31 DIAGNOSIS — R110 Nausea: Secondary | ICD-10-CM | POA: Insufficient documentation

## 2019-07-31 DIAGNOSIS — R05 Cough: Secondary | ICD-10-CM | POA: Insufficient documentation

## 2019-07-31 DIAGNOSIS — Z79899 Other long term (current) drug therapy: Secondary | ICD-10-CM | POA: Diagnosis not present

## 2019-07-31 DIAGNOSIS — U071 COVID-19: Secondary | ICD-10-CM | POA: Insufficient documentation

## 2019-07-31 DIAGNOSIS — Z20822 Contact with and (suspected) exposure to covid-19: Secondary | ICD-10-CM

## 2019-07-31 LAB — COMPREHENSIVE METABOLIC PANEL
ALT: 110 U/L — ABNORMAL HIGH (ref 0–44)
AST: 119 U/L — ABNORMAL HIGH (ref 15–41)
Albumin: 3.5 g/dL (ref 3.5–5.0)
Alkaline Phosphatase: 81 U/L (ref 38–126)
Anion gap: 15 (ref 5–15)
BUN: 11 mg/dL (ref 6–20)
CO2: 24 mmol/L (ref 22–32)
Calcium: 9.2 mg/dL (ref 8.9–10.3)
Chloride: 101 mmol/L (ref 98–111)
Creatinine, Ser: 0.64 mg/dL (ref 0.44–1.00)
GFR calc Af Amer: >60 mL/min (ref >60)
GFR calc non Af Amer: >60 mL/min (ref >60)
Glucose, Bld: 141 mg/dL — ABNORMAL HIGH (ref 70–99)
Potassium: 3.7 mmol/L (ref 3.5–5.1)
Sodium: 140 mmol/L (ref 135–145)
Total Bilirubin: 1 mg/dL (ref 0.3–1.2)
Total Protein: 7.8 g/dL (ref 6.5–8.1)

## 2019-07-31 LAB — CBC WITH DIFFERENTIAL/PLATELET
Abs Immature Granulocytes: 0.05 10*3/uL (ref 0.00–0.07)
Basophils Absolute: 0 10*3/uL (ref 0.0–0.1)
Basophils Relative: 0 %
Eosinophils Absolute: 0.1 10*3/uL (ref 0.0–0.5)
Eosinophils Relative: 1 %
HCT: 45.6 % (ref 36.0–46.0)
Hemoglobin: 13.5 g/dL (ref 12.0–15.0)
Immature Granulocytes: 1 %
Lymphocytes Relative: 33 %
Lymphs Abs: 2.4 10*3/uL (ref 0.7–4.0)
MCH: 26.4 pg (ref 26.0–34.0)
MCHC: 29.6 g/dL — ABNORMAL LOW (ref 30.0–36.0)
MCV: 89.1 fL (ref 80.0–100.0)
Monocytes Absolute: 0.3 10*3/uL (ref 0.1–1.0)
Monocytes Relative: 4 %
Neutro Abs: 4.3 10*3/uL (ref 1.7–7.7)
Neutrophils Relative %: 61 %
Platelets: 501 10*3/uL — ABNORMAL HIGH (ref 150–400)
RBC: 5.12 MIL/uL — ABNORMAL HIGH (ref 3.87–5.11)
RDW: 13.7 % (ref 11.5–15.5)
WBC: 7.2 10*3/uL (ref 4.0–10.5)
nRBC: 0.6 % — ABNORMAL HIGH (ref 0.0–0.2)

## 2019-07-31 LAB — URINALYSIS, ROUTINE W REFLEX MICROSCOPIC
Bilirubin Urine: NEGATIVE
Glucose, UA: NEGATIVE mg/dL
Ketones, ur: 20 mg/dL — AB
Nitrite: NEGATIVE
Protein, ur: 100 mg/dL — AB
Specific Gravity, Urine: 1.029 (ref 1.005–1.030)
pH: 6 (ref 5.0–8.0)

## 2019-07-31 LAB — LACTIC ACID, PLASMA: Lactic Acid, Venous: 1.6 mmol/L (ref 0.5–1.9)

## 2019-07-31 LAB — SARS CORONAVIRUS 2 BY RT PCR (HOSPITAL ORDER, PERFORMED IN ~~LOC~~ HOSPITAL LAB): SARS Coronavirus 2: POSITIVE — AB

## 2019-07-31 LAB — I-STAT BETA HCG BLOOD, ED (MC, WL, AP ONLY): I-stat hCG, quantitative: <5 m[IU]/mL (ref <5)

## 2019-07-31 MED ORDER — ACETAMINOPHEN 500 MG PO TABS
1000.0000 mg | ORAL_TABLET | Freq: Once | ORAL | Status: AC
  Administered 2019-07-31: 1000 mg via ORAL
  Filled 2019-07-31: qty 2

## 2019-07-31 MED ORDER — SODIUM CHLORIDE 0.9 % IV BOLUS
1000.0000 mL | Freq: Once | INTRAVENOUS | Status: AC
  Administered 2019-07-31: 1000 mL via INTRAVENOUS

## 2019-07-31 MED ORDER — DIPHENHYDRAMINE HCL 50 MG/ML IJ SOLN
25.0000 mg | Freq: Once | INTRAMUSCULAR | Status: AC
  Administered 2019-07-31: 25 mg via INTRAVENOUS
  Filled 2019-07-31: qty 1

## 2019-07-31 MED ORDER — KETOROLAC TROMETHAMINE 30 MG/ML IJ SOLN
15.0000 mg | Freq: Once | INTRAMUSCULAR | Status: AC
  Administered 2019-07-31: 15 mg via INTRAVENOUS
  Filled 2019-07-31: qty 1

## 2019-07-31 MED ORDER — ONDANSETRON 4 MG PO TBDP
4.0000 mg | ORAL_TABLET | Freq: Three times a day (TID) | ORAL | 0 refills | Status: DC | PRN
Start: 2019-07-31 — End: 2020-11-08

## 2019-07-31 MED ORDER — BENZONATATE 100 MG PO CAPS
100.0000 mg | ORAL_CAPSULE | Freq: Three times a day (TID) | ORAL | 0 refills | Status: DC
Start: 2019-07-31 — End: 2020-11-08

## 2019-07-31 MED ORDER — HYDROCOD POLST-CPM POLST ER 10-8 MG/5ML PO SUER
5.0000 mL | Freq: Once | ORAL | Status: AC
  Administered 2019-07-31: 5 mL via ORAL
  Filled 2019-07-31: qty 5

## 2019-07-31 MED ORDER — PROCHLORPERAZINE EDISYLATE 10 MG/2ML IJ SOLN
10.0000 mg | Freq: Once | INTRAMUSCULAR | Status: AC
  Administered 2019-07-31: 10 mg via INTRAVENOUS
  Filled 2019-07-31: qty 2

## 2019-07-31 NOTE — Discharge Instructions (Signed)
Follow up with your family doc.  Return for worsening sob or if you are unable to eat or drink

## 2019-07-31 NOTE — ED Provider Notes (Signed)
[facility] EMERGENCY DEPARTMENT Provider Note   CSN: [id number] Arrival date & time: 07/31/19  1217     History Chief Complaint  Patient presents with  . Cough  . Nausea    Vanessa Morton is a 34 y.o. female.  34 yo F with a cc of cough congestion fevers chills myalgias nausea vomiting diarrhea and generalized fatigue.  This been going on for about a week now.  She just found out that she had been at a meal with someone who was diagnosed with novel coronavirus.  She has not been feeling well.  Has kept her son out of school who also recently started throwing up.  She describes some diffuse abdominal pain.  Not really eating and drinking for the past week.  The history is provided by the patient.  Cough Associated symptoms: chills, fever and myalgias   Associated symptoms: no chest pain, no headaches, no rhinorrhea, no shortness of breath and no wheezing   Illness Severity:  Moderate Onset quality:  Gradual Duration:  1 week Timing:  Constant Progression:  Worsening Chronicity:  New Associated symptoms: abdominal pain, cough, diarrhea, fever, myalgias, nausea and vomiting   Associated symptoms: no chest pain, no congestion, no headaches, no rhinorrhea, no shortness of breath and no wheezing        Past Medical History:  Diagnosis Date  . Abscess   . Anxiety   . Chest pain   . Gonorrhea   . Hypertension   . Obesity   . Urinary tract infection     Patient Active Problem List   Diagnosis Date Noted  . Abscess of back 10/20/2018  . Axillary abscess 10/20/2018  . Morbid obesity (HCC) 10/20/2018  . Rash/skin eruption 10/20/2018    Past Surgical History:  Procedure Laterality Date  . CESAREAN SECTION    . HYDRADENITIS EXCISION N/A 03/04/2019   Procedure: EXCISION HIDRADENITIS RIGHT AXILLA X2 AND BACK X2;  Surgeon: Griselda Miner, MD;  Location: [facility] OR;  Service: General;  Laterality: N/A;  . TONSILLECTOMY       OB History   No obstetric  history on file.     Family History  Problem Relation Age of Onset  . Hypertension Mother   . Diabetes Mother   . Diabetes Other   . Hypertension Other     Social History   Tobacco Use  . Smoking status: Never Smoker  . Smokeless tobacco: Never Used  Substance Use Topics  . Alcohol use: No  . Drug use: No    Home Medications Prior to Admission medications   Medication Sig Start Date End Date Taking? Authorizing Provider  Buprenorphine HCl-Naloxone HCl 8-2 MG FILM Take 1 Film by mouth 3 (three) times daily.  07/02/18  Yes [provider]  diazepam (VALIUM) 5 MG tablet Take 5 mg by mouth 2 (two) times daily as needed. 06/23/19  Yes [provider]  gabapentin (NEURONTIN) 300 MG capsule Take 300 mg by mouth 3 (three) times daily as needed (pain).  01/25/19  Yes [provider]  MOVANTIK 25 MG TABS tablet Take 25 mg by mouth daily as needed for constipation.  12/18/18  Yes [provider]  phentermine (ADIPEX-P) 37.5 MG tablet Take 18.75 mg by mouth every morning. 06/25/19  Yes [provider]  topiramate (TOPAMAX) 50 MG tablet Take 50 mg by mouth at bedtime as needed (headaches).    Yes [provider]  Vitamin D, Ergocalciferol, (DRISDOL) 1.25 MG (50000 UT)  CAPS capsule Take 50,000 Units by mouth every Thursday.   Yes [provider]  zolpidem (AMBIEN) 5 MG tablet Take 5 mg by mouth at bedtime as needed for sleep.  02/10/19  Yes [provider]  benzonatate (TESSALON) 100 MG capsule Take 1 capsule (100 mg total) by mouth every 8 (eight) hours. 07/31/19   Melene Plan, DO  HYDROcodone-acetaminophen (NORCO/VICODIN) 5-325 MG tablet Take 1-2 tablets by mouth every 6 (six) hours as needed for moderate pain or severe pain. Patient not taking: Reported on 07/31/2019 03/04/19   Chevis Pretty III, MD  ondansetron (ZOFRAN ODT) 4 MG disintegrating tablet Take 1 tablet (4 mg total) by mouth every 8 (eight) hours as needed for nausea or  vomiting. 07/31/19   Melene Plan, DO    Allergies    Patient has no known allergies.  Review of Systems   Review of Systems  Constitutional: Positive for chills and fever.  HENT: Negative for congestion and rhinorrhea.   Eyes: Negative for redness and visual disturbance.  Respiratory: Positive for cough. Negative for shortness of breath and wheezing.   Cardiovascular: Negative for chest pain and palpitations.  Gastrointestinal: Positive for abdominal pain, diarrhea, nausea and vomiting.  Genitourinary: Negative for dysuria and urgency.  Musculoskeletal: Positive for myalgias. Negative for arthralgias.  Skin: Negative for pallor and wound.  Neurological: Negative for dizziness and headaches.    Physical Exam Updated Vital Signs BP (!) 125/97   Pulse (!) 104   Temp 98.7 F (37.1 C) (Oral)   Resp 20   SpO2 94%   Physical Exam Vitals and nursing note reviewed.  Constitutional:      General: She is not in acute distress.    Appearance: She is well-developed. She is obese. She is not diaphoretic.  HENT:     Head: Normocephalic and atraumatic.  Eyes:     Pupils: Pupils are equal, round, and reactive to light.  Cardiovascular:     Rate and Rhythm: Normal rate and regular rhythm.     Heart sounds: No murmur. No friction rub. No gallop.   Pulmonary:     Effort: Pulmonary effort is normal.     Breath sounds: No wheezing or rales.  Abdominal:     General: There is no distension.     Palpations: Abdomen is soft.     Tenderness: There is no abdominal tenderness.     Comments: Benign abdominal exam  Musculoskeletal:        General: No tenderness.     Cervical back: Normal range of motion and neck supple.  Skin:    General: Skin is warm and dry.  Neurological:     Mental Status: She is alert and oriented to person, place, and time.  Psychiatric:        Behavior: Behavior normal.     ED Results / Procedures / Treatments   Labs (all labs ordered are listed, but only  abnormal results are displayed) Labs Reviewed  COMPREHENSIVE METABOLIC PANEL - Abnormal; Notable for the following components:      Result Value   Glucose, Bld 141 (*)    AST 119 (*)    ALT 110 (*)    All other components within normal limits  CBC WITH DIFFERENTIAL/PLATELET - Abnormal; Notable for the following components:   RBC 5.12 (*)    MCHC 29.6 (*)    Platelets 501 (*)    nRBC 0.6 (*)    All other components within normal limits  URINALYSIS, ROUTINE  W REFLEX MICROSCOPIC - Abnormal; Notable for the following components:   Color, Urine AMBER (*)    APPearance CLOUDY (*)    Hgb urine dipstick SMALL (*)    Ketones, ur 20 (*)    Protein, ur 100 (*)    Leukocytes,Ua TRACE (*)    Bacteria, UA RARE (*)    All other components within normal limits  SARS CORONAVIRUS 2 BY RT PCR (HOSPITAL ORDER, PERFORMED IN [facility])  LACTIC ACID, PLASMA  I-STAT BETA HCG BLOOD, ED ([facility], [facility], [facility] ONLY)    EKG None  Radiology DG Chest Portable 1 View  Result Date: 07/31/2019 CLINICAL DATA:  Cough and fever with recent COVID exposure EXAM: PORTABLE CHEST 1 VIEW COMPARISON:  04/08/2018 FINDINGS: Cardiac shadow is enlarged but stable. The lungs are hypoinflated. Patchy opacities are noted throughout both lungs consistent with atypical pneumonia. No bony abnormality is noted. IMPRESSION: Patchy opacities bilaterally. Given the patient's clinical history correlation with COVID-19 testing is recommended. Electronically Signed   By: Alcide Clever M.D.   On: 07/31/2019 16:08    Procedures Procedures (including critical care time)  Medications Ordered in ED Medications  sodium chloride 0.9 % bolus 1,000 mL (1,000 mLs Intravenous New Bag/Given 07/31/19 1755)  prochlorperazine (COMPAZINE) injection 10 mg (10 mg Intravenous Given 07/31/19 1758)  diphenhydrAMINE (BENADRYL) injection 25 mg (25 mg Intravenous Given 07/31/19 1757)  ketorolac (TORADOL) 30 MG/ML injection 15 mg (15 mg Intravenous Given  07/31/19 1803)  acetaminophen (TYLENOL) tablet 1,000 mg (1,000 mg Oral Given 07/31/19 1751)  chlorpheniramine-HYDROcodone (TUSSIONEX) 10-8 MG/5ML suspension 5 mL (5 mLs Oral Given 07/31/19 1755)    ED Course  I have reviewed the triage vital signs and the nursing notes.  Pertinent labs & imaging results that were available during my care of the patient were reviewed by me and considered in my medical decision making (see chart for details).    MDM Rules/Calculators/A&P                      34 year old female with a chief complaints of cough congestion fever chills myalgias nausea vomiting diarrhea.  Patient was recently exposed to someone with the novel coronavirus.  Endorses that she is not been eating and drinking for the past week.  She is tachycardic here into the 120s.  We will give a bolus of IV fluids antiemetics reassess.  Patient feeling mildly better.  Tolerating PO.  D/c home.   Vanessa Morton was evaluated in Emergency Department on 07/31/2019 for the symptoms described in the history of present illness. He/she was evaluated in the context of the global COVID-19 pandemic, which necessitated consideration that the patient might be at risk for infection with the SARS-CoV-2 virus that causes COVID-19. Institutional protocols and algorithms that pertain to the evaluation of patients at risk for COVID-19 are in a state of rapid change based on information released by regulatory bodies including the CDC and federal and state organizations. These policies and algorithms were followed during the patient's care in the ED.  7:00 PM:  I have discussed the diagnosis/risks/treatment options with the patient and believe the pt to be eligible for discharge home to follow-up with PCP. We also discussed returning to the ED immediately if new or worsening sx occur. We discussed the sx which are most concerning (e.g., sudden worsening pain, fever, inability to tolerate by mouth) that necessitate immediate  return. Medications administered to the patient during their visit and any new  prescriptions provided to the patient are listed below.  Medications given during this visit Medications  sodium chloride 0.9 % bolus 1,000 mL (1,000 mLs Intravenous New Bag/Given 07/31/19 1755)  prochlorperazine (COMPAZINE) injection 10 mg (10 mg Intravenous Given 07/31/19 1758)  diphenhydrAMINE (BENADRYL) injection 25 mg (25 mg Intravenous Given 07/31/19 1757)  ketorolac (TORADOL) 30 MG/ML injection 15 mg (15 mg Intravenous Given 07/31/19 1803)  acetaminophen (TYLENOL) tablet 1,000 mg (1,000 mg Oral Given 07/31/19 1751)  chlorpheniramine-HYDROcodone (TUSSIONEX) 10-8 MG/5ML suspension 5 mL (5 mLs Oral Given 07/31/19 1755)     The patient appears reasonably screen and/or stabilized for discharge and I doubt any other medical condition or other [facility] requiring further screening, evaluation, or treatment in the ED at this time prior to discharge.    Final Clinical Impression(s) / ED Diagnoses Final diagnoses:  Suspected COVID-19 virus infection    Rx / DC Orders ED Discharge Orders         Ordered    ondansetron (ZOFRAN ODT) 4 MG disintegrating tablet  Every 8 hours PRN     07/31/19 1859    benzonatate (TESSALON) 100 MG capsule  Every 8 hours     07/31/19 1859           Deno Etienne, DO 07/31/19 1900

## 2019-07-31 NOTE — ED Notes (Signed)
Patient verbalizes understanding of discharge instructions. Opportunity for questioning and answers were provided. Armband removed by staff, pt discharged from ED.  

## 2019-07-31 NOTE — ED Triage Notes (Signed)
Pt reports covid exposure 1.5 weeks ago, states she began having cough and nausea shortly after. Endorses fevers, chills and bodyaches as well.

## 2019-08-01 ENCOUNTER — Telehealth: Payer: Self-pay | Admitting: Nurse Practitioner

## 2019-08-01 ENCOUNTER — Telehealth: Payer: Self-pay | Admitting: Physician Assistant

## 2019-08-01 ENCOUNTER — Encounter: Payer: Self-pay | Admitting: Physician Assistant

## 2019-08-01 NOTE — Telephone Encounter (Signed)
Called to discuss with patient about Covid symptoms and the use of bamlanivimab/etesevimab or casirivimab/imdevimab, a monoclonal antibody infusion for those with mild to moderate Covid symptoms and at a high risk of hospitalization.  Pt is qualified for this infusion at the [facility] infusion center due to BMI>35   Message left to call back  Loralye Loberg PA-C  [facility]    

## 2019-08-01 NOTE — Telephone Encounter (Signed)
Called to discuss with Sela Hilding about Covid symptoms and the use of bamlanivimab combination, a monoclonal antibody infusion for those with mild to moderate Covid symptoms and at a high risk of hospitalization.     Pt is qualified for this infusion at the [facility] infusion center due to co-morbid conditions and/or a member of an at-risk group (BMI >35).   Unable to reach, Message left.      Patient Active Problem List   Diagnosis Date Noted  . Abscess of back 10/20/2018  . Axillary abscess 10/20/2018  . Morbid obesity (HCC) 10/20/2018  . Rash/skin eruption 10/20/2018    Willette Alma, AGPCNP-BC Pager: [phone] Amion: Thea Alken

## 2019-08-02 ENCOUNTER — Telehealth (HOSPITAL_COMMUNITY): Payer: Self-pay

## 2019-08-10 ENCOUNTER — Encounter (HOSPITAL_COMMUNITY): Payer: Self-pay

## 2019-08-10 ENCOUNTER — Ambulatory Visit (HOSPITAL_COMMUNITY)
Admission: EM | Admit: 2019-08-10 | Discharge: 2019-08-10 | Disposition: A | Discharge status: Home / Self Care | Payer: Medicaid Other | Attending: Family Medicine | Admitting: Family Medicine

## 2019-08-10 ENCOUNTER — Emergency Department (HOSPITAL_COMMUNITY)
Admission: EM | Admit: 2019-08-10 | Discharge: 2019-08-10 | Disposition: A | Discharge status: Home / Self Care | Payer: Medicaid Other | Attending: Emergency Medicine | Admitting: Emergency Medicine

## 2019-08-10 ENCOUNTER — Other Ambulatory Visit: Payer: Self-pay

## 2019-08-10 DIAGNOSIS — R55 Syncope and collapse: Secondary | ICD-10-CM | POA: Insufficient documentation

## 2019-08-10 DIAGNOSIS — R2243 Localized swelling, mass and lump, lower limb, bilateral: Secondary | ICD-10-CM | POA: Diagnosis present

## 2019-08-10 DIAGNOSIS — Z5321 Procedure and treatment not carried out due to patient leaving prior to being seen by health care provider: Secondary | ICD-10-CM | POA: Insufficient documentation

## 2019-08-10 NOTE — ED Notes (Signed)
Patient is being discharged from the Urgent Care Center and sent to the Emergency Department via wheelchair by staff. Per Dr. Delton See, patient is stable but in need of higher level of care due to head injury, LOC, somnolence.  Patient is aware and verbalizes understanding of plan of care.  Vitals:   08/10/19 0916  BP: (!) 148/89  Pulse: 70  Resp: 18  Temp: 98.1 F (36.7 C)

## 2019-08-10 NOTE — ED Triage Notes (Signed)
Pt sent by UC for further evaluation of syncopal fall that occurred on Saturday. Pt c.o pain and swelling to both her legs, more so on the left foot. Pt doesn't remember much from the fall, states she just remembers waking up on the floor. Pt tested positive for COVID 5/15

## 2019-08-10 NOTE — ED Notes (Signed)
Pt left due to wait time. LWBS.  

## 2019-08-10 NOTE — ED Notes (Signed)
Eval pt in waiting room: pt somnolent at times and dosing while on phone. Pt reports she feel "a couple days ago" and struck head, with positive LOC and has been "sleepy" since then. Also c/o bilat let pain. Per Dr. Delton See, pt advised to go to ER for higher level eval/tx such as CT head to r/o head trauma. Explained need to go to ER to pt. At first pt was hesitant to go to ER, once pt verbalized understanding of [facility] medical staff concern for possible head injury and need for possible CT, pt verbalized understanding/agreement.  Pt continued to have periods of somnolence in wc.

## 2019-10-20 ENCOUNTER — Other Ambulatory Visit: Payer: Self-pay

## 2019-10-20 ENCOUNTER — Encounter (HOSPITAL_COMMUNITY): Payer: Self-pay

## 2019-10-20 ENCOUNTER — Ambulatory Visit (HOSPITAL_COMMUNITY)
Admission: EM | Admit: 2019-10-20 | Discharge: 2019-10-20 | Disposition: A | Discharge status: Home / Self Care | Payer: Medicaid Other | Attending: Physician Assistant | Admitting: Physician Assistant

## 2019-10-20 DIAGNOSIS — H60501 Unspecified acute noninfective otitis externa, right ear: Secondary | ICD-10-CM | POA: Diagnosis not present

## 2019-10-20 MED ORDER — NEOMYCIN-POLYMYXIN-HC 3.5-10000-1 OT SUSP
4.0000 [drp] | Freq: Three times a day (TID) | OTIC | 0 refills | Status: DC
Start: 2019-10-20 — End: 2020-11-08

## 2019-10-20 NOTE — ED Triage Notes (Signed)
Pt presents to UC for possible q-tip stuck in right ear. Pt states she has been trying to get it out with more q-tips, hair pins, peroxide, the tip of a comb, with no success. Pt states ear is now painful.

## 2019-10-20 NOTE — ED Provider Notes (Signed)
[facility]    CSN: [id number] Arrival date & time: 10/20/19  1749      History   Chief Complaint Chief Complaint  Patient presents with  . Foreign Body    HPI Vanessa Morton is a 34 y.o. female.   Patient reports for evaluation of right ear pain.  She reports she believes she got a Q-tip stuck in the right ear last night.  She has been trying many things to get this out.  Developed pain through the evening and this morning.  She reports pain is felt below her ear as well.  Denies fever or chills.     Past Medical History:  Diagnosis Date  . Abscess   . Anxiety   . Chest pain   . Gonorrhea   . Hypertension   . Morbid obesity (HCC)   . Obesity   . Urinary tract infection     Patient Active Problem List   Diagnosis Date Noted  . Abscess of back 10/20/2018  . Axillary abscess 10/20/2018  . Morbid obesity (HCC) 10/20/2018  . Rash/skin eruption 10/20/2018    Past Surgical History:  Procedure Laterality Date  . CESAREAN SECTION    . HYDRADENITIS EXCISION N/A 03/04/2019   Procedure: EXCISION HIDRADENITIS RIGHT AXILLA X2 AND BACK X2;  Surgeon: Griselda Miner, MD;  Location: [facility] OR;  Service: General;  Laterality: N/A;  . TONSILLECTOMY      OB History   No obstetric history on file.      Home Medications    Prior to Admission medications   Medication Sig Start Date End Date Taking? Authorizing Provider  benzonatate (TESSALON) 100 MG capsule Take 1 capsule (100 mg total) by mouth every 8 (eight) hours. 07/31/19   Melene Plan, DO  Buprenorphine HCl-Naloxone HCl 8-2 MG FILM Take 1 Film by mouth 3 (three) times daily.  07/02/18   [provider]  diazepam (VALIUM) 5 MG tablet Take 5 mg by mouth 2 (two) times daily as needed. 06/23/19   [provider]  gabapentin (NEURONTIN) 300 MG capsule Take 300 mg by mouth 3 (three) times daily as needed (pain).  01/25/19   [provider]  HYDROcodone-acetaminophen (NORCO/VICODIN) 5-325 MG  tablet Take 1-2 tablets by mouth every 6 (six) hours as needed for moderate pain or severe pain. Patient not taking: Reported on 07/31/2019 03/04/19   Chevis Pretty III, MD  MOVANTIK 25 MG TABS tablet Take 25 mg by mouth daily as needed for constipation.  12/18/18   [provider]  neomycin-polymyxin-hydrocortisone (CORTISPORIN) 3.5-10000-1 OTIC suspension Place 4 drops into the right ear 3 (three) times daily. 10/20/19   Estephanie Hubbs, Veryl Speak, PA-C  ondansetron (ZOFRAN ODT) 4 MG disintegrating tablet Take 1 tablet (4 mg total) by mouth every 8 (eight) hours as needed for nausea or vomiting. 07/31/19   Melene Plan, DO  phentermine (ADIPEX-P) 37.5 MG tablet Take 18.75 mg by mouth every morning. 06/25/19   [provider]  topiramate (TOPAMAX) 50 MG tablet Take 50 mg by mouth at bedtime as needed (headaches).     [provider]  Vitamin D, Ergocalciferol, (DRISDOL) 1.25 MG (50000 UT) CAPS capsule Take 50,000 Units by mouth every Thursday.    [provider]  zolpidem (AMBIEN) 5 MG tablet Take 5 mg by mouth at bedtime as needed for sleep.  02/10/19   [provider]    Family History Family History  Problem Relation Age of Onset  . Hypertension Mother   .  Diabetes Mother   . Diabetes Other   . Hypertension Other     Social History Social History   Tobacco Use  . Smoking status: Never Smoker  . Smokeless tobacco: Never Used  Vaping Use  . Vaping Use: Never used  Substance Use Topics  . Alcohol use: No  . Drug use: No     Allergies   Patient has no known allergies.   Review of Systems Review of Systems   Physical Exam Triage Vital Signs ED Triage Vitals  Enc Vitals Group     BP 10/20/19 1853 127/76     Pulse Rate 10/20/19 1853 73     Resp 10/20/19 1853 18     Temp 10/20/19 1853 98.1 F (36.7 C)     Temp Source 10/20/19 1853 Oral     SpO2 10/20/19 1853 99 %     Weight --      Height --      Head Circumference --      Peak Flow --       Pain Score 10/20/19 1855 0     Pain Loc --      Pain Edu? --      Excl. in GC? --    No data found.  Updated Vital Signs BP 127/76 (BP Location: Left Arm)   Pulse 73   Temp 98.1 F (36.7 C) (Oral)   Resp 18   SpO2 99%   Visual Acuity Right Eye Distance:   Left Eye Distance:   Bilateral Distance:    Right Eye Near:   Left Eye Near:    Bilateral Near:     Physical Exam Vitals and nursing note reviewed.  Constitutional:      General: She is not in acute distress.    Appearance: She is well-developed.  HENT:     Head: Normocephalic and atraumatic.     Right Ear: Tympanic membrane normal.     Left Ear: Tympanic membrane, ear canal and external ear normal.     Ears:     Comments: Right canal without foreign object.  Irritation and erythema in the canal.  There is tenderness to manipulation of the external ear. Eyes:     Conjunctiva/sclera: Conjunctivae normal.  Cardiovascular:     Rate and Rhythm: Normal rate.     Heart sounds: No murmur heard.   Pulmonary:     Effort: Pulmonary effort is normal. No respiratory distress.  Musculoskeletal:     Cervical back: Neck supple.  Skin:    General: Skin is warm and dry.  Neurological:     Mental Status: She is alert.      UC Treatments / Results  Labs (all labs ordered are listed, but only abnormal results are displayed) Labs Reviewed - No data to display  EKG   Radiology No results found.  Procedures Procedures (including critical care time)  Medications Ordered in UC Medications - No data to display  Initial Impression / Assessment and Plan / UC Course  I have reviewed the triage vital signs and the nursing notes.  Pertinent labs & imaging results that were available during my care of the patient were reviewed by me and considered in my medical decision making (see chart for details).     Otitis externa Patient is a 34 year old presenting with otitis externa secondary irritation from trying to remove a  foreign object.  No foreign object on exam.  Will trial Cortisporin drops for symptomatic relief and prevention of infection.  Also recommended over-the-counter ibuprofen.  Follow-up and return precautions discussed.  Patient verbalized understanding. Final Clinical Impressions(s) / UC Diagnoses   Final diagnoses:  Acute otitis externa of right ear, unspecified type     Discharge Instructions     Use the drops in the right ear 3 times a day for up to 7 days or until symptoms resolve  Take over the counter ibuprofen or tylenol as needed  If not improved over next 4-5 days return    ED Prescriptions    Medication Sig Dispense Auth. Provider   neomycin-polymyxin-hydrocortisone (CORTISPORIN) 3.5-10000-1 OTIC suspension Place 4 drops into the right ear 3 (three) times daily. 10 mL Vanessa Morton, Veryl Speak, PA-C     PDMP not reviewed this encounter.   Vanessa Medicus, PA-C 10/20/19 1930

## 2019-10-20 NOTE — Discharge Instructions (Signed)
Use the drops in the right ear 3 times a day for up to 7 days or until symptoms resolve  Take over the counter ibuprofen or tylenol as needed  If not improved over next 4-5 days return

## 2019-12-02 ENCOUNTER — Ambulatory Visit (HOSPITAL_COMMUNITY)
Admission: EM | Admit: 2019-12-02 | Discharge: 2019-12-02 | Disposition: A | Discharge status: Home / Self Care | Payer: Medicaid Other | Attending: Emergency Medicine | Admitting: Emergency Medicine

## 2019-12-02 ENCOUNTER — Other Ambulatory Visit: Payer: Self-pay

## 2019-12-02 ENCOUNTER — Encounter (HOSPITAL_COMMUNITY): Payer: Self-pay | Admitting: Emergency Medicine

## 2019-12-02 DIAGNOSIS — K649 Unspecified hemorrhoids: Secondary | ICD-10-CM | POA: Diagnosis not present

## 2019-12-02 DIAGNOSIS — K5900 Constipation, unspecified: Secondary | ICD-10-CM | POA: Diagnosis not present

## 2019-12-02 DIAGNOSIS — M7989 Other specified soft tissue disorders: Secondary | ICD-10-CM

## 2019-12-02 MED ORDER — FUROSEMIDE 20 MG PO TABS
20.0000 mg | ORAL_TABLET | Freq: Every day | ORAL | 0 refills | Status: DC
Start: 2019-12-02 — End: 2020-11-08

## 2019-12-02 MED ORDER — MAGNESIUM CITRATE PO SOLN
1.0000 | Freq: Once | ORAL | 2 refills | Status: AC
Start: 2019-12-02 — End: 2019-12-02

## 2019-12-02 NOTE — Discharge Instructions (Signed)
Pt will need to be seen in the ER if swelling to lower legs does not decrease with medications or she begins to have calf pain or shortness of breath  You spoke with your doctor about this on fri and would need to follow up with them in 2 days  Avoid salt  Stay hydrated to help with constipation  With need to take stool softners daily if you cont to take pain meds or weight loss medication

## 2019-12-02 NOTE — ED Triage Notes (Signed)
Pt presents with constipation and leg swelling.

## 2019-12-02 NOTE — ED Provider Notes (Signed)
[facility]    CSN: [id number] Arrival date & time: 12/02/19  1908      History   Chief Complaint Chief Complaint  Patient presents with  . Constipation    HPI Vanessa Morton is a 34 y.o. female.   HPI  Past Medical History:  Diagnosis Date  . Abscess   . Anxiety   . Chest pain   . Gonorrhea   . Hypertension   . Morbid obesity (HCC)   . Obesity   . Urinary tract infection     Patient Active Problem List   Diagnosis Date Noted  . Abscess of back 10/20/2018  . Axillary abscess 10/20/2018  . Morbid obesity (HCC) 10/20/2018  . Rash/skin eruption 10/20/2018    Past Surgical History:  Procedure Laterality Date  . CESAREAN SECTION    . HYDRADENITIS EXCISION N/A 03/04/2019   Procedure: EXCISION HIDRADENITIS RIGHT AXILLA X2 AND BACK X2;  Surgeon: Griselda Miner, MD;  Location: [facility] OR;  Service: General;  Laterality: N/A;  . TONSILLECTOMY      OB History   No obstetric history on file.      Home Medications    Prior to Admission medications   Medication Sig Start Date End Date Taking? Authorizing Provider  benzonatate (TESSALON) 100 MG capsule Take 1 capsule (100 mg total) by mouth every 8 (eight) hours. 07/31/19   Melene Plan, DO  Buprenorphine HCl-Naloxone HCl 8-2 MG FILM Take 1 Film by mouth 3 (three) times daily.  07/02/18   [provider]  diazepam (VALIUM) 5 MG tablet Take 5 mg by mouth 2 (two) times daily as needed. 06/23/19   [provider]  furosemide (LASIX) 20 MG tablet Take 1 tablet (20 mg total) by mouth daily. 12/02/19   Coralyn Mark, NP  gabapentin (NEURONTIN) 300 MG capsule Take 300 mg by mouth 3 (three) times daily as needed (pain).  01/25/19   [provider]  HYDROcodone-acetaminophen (NORCO/VICODIN) 5-325 MG tablet Take 1-2 tablets by mouth every 6 (six) hours as needed for moderate pain or severe pain. Patient not taking: Reported on 07/31/2019 03/04/19   Chevis Pretty III, MD  magnesium citrate SOLN  Take 296 mLs (1 Bottle total) by mouth once for 1 dose. 12/02/19 12/02/19  Coralyn Mark, NP  MOVANTIK 25 MG TABS tablet Take 25 mg by mouth daily as needed for constipation.  12/18/18   [provider]  neomycin-polymyxin-hydrocortisone (CORTISPORIN) 3.5-10000-1 OTIC suspension Place 4 drops into the right ear 3 (three) times daily. 10/20/19   Darr, Veryl Speak, PA-C  ondansetron (ZOFRAN ODT) 4 MG disintegrating tablet Take 1 tablet (4 mg total) by mouth every 8 (eight) hours as needed for nausea or vomiting. 07/31/19   Melene Plan, DO  phentermine (ADIPEX-P) 37.5 MG tablet Take 18.75 mg by mouth every morning. 06/25/19   [provider]  topiramate (TOPAMAX) 50 MG tablet Take 50 mg by mouth at bedtime as needed (headaches).     [provider]  Vitamin D, Ergocalciferol, (DRISDOL) 1.25 MG (50000 UT) CAPS capsule Take 50,000 Units by mouth every Thursday.    [provider]  zolpidem (AMBIEN) 5 MG tablet Take 5 mg by mouth at bedtime as needed for sleep.  02/10/19   [provider]    Family History Family History  Problem Relation Age of Onset  . Hypertension Mother   . Diabetes Mother   . Diabetes Other   . Hypertension Other  Social History Social History   Tobacco Use  . Smoking status: Never Smoker  . Smokeless tobacco: Never Used  Vaping Use  . Vaping Use: Never used  Substance Use Topics  . Alcohol use: No  . Drug use: No     Allergies   Patient has no known allergies.   Review of Systems Review of Systems   Physical Exam Triage Vital Signs ED Triage Vitals  Enc Vitals Group     BP 12/02/19 2025 139/80     Pulse Rate 12/02/19 2025 81     Resp 12/02/19 2025 18     Temp 12/02/19 2025 97.7 F (36.5 C)     Temp Source 12/02/19 2025 Oral     SpO2 12/02/19 2025 100 %     Weight --      Height --      Head Circumference --      Peak Flow --      Pain Score 12/02/19 2027 6     Pain Loc --      Pain Edu? --      Excl.  in GC? --    No data found.  Updated Vital Signs BP 139/80 (BP Location: Right Arm)   Pulse 81   Temp 97.7 F (36.5 C) (Oral)   Resp 18   SpO2 100%   Visual Acuity     Physical Exam   UC Treatments / Results  Labs (all labs ordered are listed, but only abnormal results are displayed) Labs Reviewed - No data to display  EKG   Radiology No results found.  Procedures Procedures (including critical care time)  Medications Ordered in UC Medications - No data to display  Initial Impression / Assessment and Plan / UC Course  I have reviewed the triage vital signs and the nursing notes.  Pertinent labs & imaging results that were available during my care of the patient were reviewed by me and considered in my medical decision making (see chart for details).     Pt will need to be seen in the ER if swelling to lower legs does not decrease with medications or she begins to have calf pain or shortness of breath  You spoke with your doctor about this on fri and would need to follow up with them in 2 days  Avoid salt  Stay hydrated to help with constipation  With need to take stool softners daily if you cont to take pain meds or weight loss medication  Final Clinical Impressions(s) / UC Diagnoses   Final diagnoses:  Constipation, unspecified constipation type  Leg swelling  Hemorrhoids, unspecified hemorrhoid type     Discharge Instructions     Pt will need to be seen in the ER if swelling to lower legs does not decrease with medications or she begins to have calf pain or shortness of breath  You spoke with your doctor about this on fri and would need to follow up with them in 2 days  Avoid salt  Stay hydrated to help with constipation  With need to take stool softners daily if you cont to take pain meds or weight loss medication    ED Prescriptions    Medication Sig Dispense Auth. Provider   magnesium citrate SOLN Take 296 mLs (1 Bottle total) by mouth once for  1 dose. 195 mL Maple Mirza L, NP   furosemide (LASIX) 20 MG tablet Take 1 tablet (20 mg total) by mouth daily. 15 tablet Maple Mirza  L, NP     PDMP not reviewed this encounter.   Coralyn Mark, NP 12/03/19 [phone]

## 2019-12-10 ENCOUNTER — Emergency Department (HOSPITAL_BASED_OUTPATIENT_CLINIC_OR_DEPARTMENT_OTHER): Payer: Medicaid Other

## 2019-12-10 ENCOUNTER — Emergency Department (HOSPITAL_COMMUNITY): Payer: Medicaid Other

## 2019-12-10 ENCOUNTER — Emergency Department (HOSPITAL_COMMUNITY)
Admission: EM | Admit: 2019-12-10 | Discharge: 2019-12-10 | Disposition: A | Discharge status: Home / Self Care | Payer: Medicaid Other | Attending: Emergency Medicine | Admitting: Emergency Medicine

## 2019-12-10 ENCOUNTER — Other Ambulatory Visit: Payer: Self-pay

## 2019-12-10 ENCOUNTER — Encounter (HOSPITAL_COMMUNITY): Payer: Self-pay

## 2019-12-10 DIAGNOSIS — Z20822 Contact with and (suspected) exposure to covid-19: Secondary | ICD-10-CM | POA: Insufficient documentation

## 2019-12-10 DIAGNOSIS — R079 Chest pain, unspecified: Secondary | ICD-10-CM | POA: Diagnosis not present

## 2019-12-10 DIAGNOSIS — Z79899 Other long term (current) drug therapy: Secondary | ICD-10-CM | POA: Diagnosis not present

## 2019-12-10 DIAGNOSIS — R7989 Other specified abnormal findings of blood chemistry: Secondary | ICD-10-CM

## 2019-12-10 DIAGNOSIS — R0600 Dyspnea, unspecified: Secondary | ICD-10-CM | POA: Insufficient documentation

## 2019-12-10 DIAGNOSIS — Z8616 Personal history of COVID-19: Secondary | ICD-10-CM | POA: Diagnosis not present

## 2019-12-10 DIAGNOSIS — R0602 Shortness of breath: Secondary | ICD-10-CM | POA: Diagnosis present

## 2019-12-10 DIAGNOSIS — M7989 Other specified soft tissue disorders: Secondary | ICD-10-CM | POA: Diagnosis not present

## 2019-12-10 DIAGNOSIS — R519 Headache, unspecified: Secondary | ICD-10-CM | POA: Insufficient documentation

## 2019-12-10 DIAGNOSIS — I10 Essential (primary) hypertension: Secondary | ICD-10-CM | POA: Diagnosis not present

## 2019-12-10 LAB — RESPIRATORY PANEL BY RT PCR (FLU A&B, COVID)
Influenza A by PCR: NEGATIVE
Influenza B by PCR: NEGATIVE
SARS Coronavirus 2 by RT PCR: NEGATIVE

## 2019-12-10 LAB — CBC
HCT: 33.8 % — ABNORMAL LOW (ref 36.0–46.0)
Hemoglobin: 10.4 g/dL — ABNORMAL LOW (ref 12.0–15.0)
MCH: 29.1 pg (ref 26.0–34.0)
MCHC: 30.8 g/dL (ref 30.0–36.0)
MCV: 94.4 fL (ref 80.0–100.0)
Platelets: 299 10*3/uL (ref 150–400)
RBC: 3.58 MIL/uL — ABNORMAL LOW (ref 3.87–5.11)
RDW: 14.6 % (ref 11.5–15.5)
WBC: 7.5 10*3/uL (ref 4.0–10.5)
nRBC: 0 % (ref 0.0–0.2)

## 2019-12-10 LAB — BASIC METABOLIC PANEL
Anion gap: 10 (ref 5–15)
BUN: 9 mg/dL (ref 6–20)
CO2: 28 mmol/L (ref 22–32)
Calcium: 9 mg/dL (ref 8.9–10.3)
Chloride: 107 mmol/L (ref 98–111)
Creatinine, Ser: 0.65 mg/dL (ref 0.44–1.00)
GFR calc Af Amer: >60 mL/min (ref >60)
GFR calc non Af Amer: >60 mL/min (ref >60)
Glucose, Bld: 93 mg/dL (ref 70–99)
Potassium: 4.1 mmol/L (ref 3.5–5.1)
Sodium: 145 mmol/L (ref 135–145)

## 2019-12-10 LAB — D-DIMER, QUANTITATIVE: D-Dimer, Quant: 0.75 ug/mL-FEU — ABNORMAL HIGH (ref 0.00–0.50)

## 2019-12-10 LAB — BRAIN NATRIURETIC PEPTIDE: B Natriuretic Peptide: 105.4 pg/mL — ABNORMAL HIGH (ref 0.0–100.0)

## 2019-12-10 LAB — TROPONIN I (HIGH SENSITIVITY)
Troponin I (High Sensitivity): 3 ng/L (ref <18)
Troponin I (High Sensitivity): 3 ng/L (ref <18)

## 2019-12-10 LAB — I-STAT BETA HCG BLOOD, ED (MC, WL, AP ONLY): I-stat hCG, quantitative: <5 m[IU]/mL (ref <5)

## 2019-12-10 MED ORDER — IOHEXOL 350 MG/ML SOLN
100.0000 mL | Freq: Once | INTRAVENOUS | Status: AC | PRN
  Administered 2019-12-10: 100 mL via INTRAVENOUS

## 2019-12-10 NOTE — ED Notes (Signed)
This RN attempted IV access with lab draw two times without success. Pt given water to drink. Covid swab sent to lab. Will attempt again after pt drinks water.

## 2019-12-10 NOTE — Progress Notes (Signed)
Bilateral lower extremity venous duplex completed. Refer to "CV Proc" under chart review to view preliminary results.  12/10/2019 3:11 PM Eula Fried., MHA, RVT, RDCS, RDMS

## 2019-12-10 NOTE — Discharge Instructions (Signed)
Your ER evaluation is reassuring.  No signs of pneumonia.  No signs of blood clot in the lungs.  You did not have any blood clots in your legs.  Your Covid and flu tests are negative.  Follow up with your doctor to be rechecked.

## 2019-12-10 NOTE — ED Provider Notes (Signed)
[facility]-EMERGENCY DEPT Provider Note   CSN: [id number] Arrival date & time: 12/10/19  0759     History Chief Complaint  Patient presents with  . Shortness of Breath  . Chills  . Chest Pain    Vanessa Morton is a 34 y.o. female.  HPI   Patient states she started having difficulty with some chest pain and body aches several days ago.  She began developing chills, headaches and some shortness of breath.  Patient states she did have Covid several months ago.  She has not been vaccinated.  She was recently reexposed to someone with Covid at work.  Patient does have history of hypertension has been taking her medications.  Patient also complains of some lower extremity soreness and swelling.  Patient states she did fall recently.  She was seen at an urgent care.  Patient was told the x-rays were negative.  Past Medical History:  Diagnosis Date  . Abscess   . Anxiety   . Chest pain   . Gonorrhea   . Hypertension   . Morbid obesity (HCC)   . Obesity   . Urinary tract infection     Patient Active Problem List   Diagnosis Date Noted  . Abscess of back 10/20/2018  . Axillary abscess 10/20/2018  . Morbid obesity (HCC) 10/20/2018  . Rash/skin eruption 10/20/2018    Past Surgical History:  Procedure Laterality Date  . CESAREAN SECTION    . HYDRADENITIS EXCISION N/A 03/04/2019   Procedure: EXCISION HIDRADENITIS RIGHT AXILLA X2 AND BACK X2;  Surgeon: Griselda Miner, MD;  Location: [facility] OR;  Service: General;  Laterality: N/A;  . TONSILLECTOMY       OB History   No obstetric history on file.     Family History  Problem Relation Age of Onset  . Hypertension Mother   . Diabetes Mother   . Diabetes Other   . Hypertension Other     Social History   Tobacco Use  . Smoking status: Never Smoker  . Smokeless tobacco: Never Used  Vaping Use  . Vaping Use: Never used  Substance Use Topics  . Alcohol use: No  . Drug use: No    Home  Medications Prior to Admission medications   Medication Sig Start Date End Date Taking? Authorizing Provider  Buprenorphine HCl-Naloxone HCl 8-2 MG FILM Take 1 Film by mouth 3 (three) times daily.  07/02/18  Yes [provider]  diazepam (VALIUM) 5 MG tablet Take 5 mg by mouth 2 (two) times daily as needed for anxiety.  06/23/19  Yes [provider]  furosemide (LASIX) 20 MG tablet Take 1 tablet (20 mg total) by mouth daily. 12/02/19  Yes Coralyn Mark, NP  gabapentin (NEURONTIN) 300 MG capsule Take 300 mg by mouth in the morning, at noon, and at bedtime.  01/25/19  Yes [provider]  hydrochlorothiazide (HYDRODIURIL) 25 MG tablet Take 25 mg by mouth every morning. 11/25/19  Yes [provider]  losartan (COZAAR) 50 MG tablet Take 50 mg by mouth daily. 10/29/19  Yes [provider]  magnesium citrate SOLN Take 1 Bottle by mouth as needed for mild constipation.  12/02/19  Yes [provider]  NYSTATIN powder Apply 1 application topically 3 (three) times daily as needed (skin irritation).  09/28/19  Yes [provider]  phentermine (ADIPEX-P) 37.5 MG tablet Take 37.5 mg by mouth every morning.  06/25/19  Yes [provider]  topiramate (TOPAMAX) 50 MG  tablet Take 50 mg by mouth at bedtime as needed (headaches).    Yes [provider]  zolpidem (AMBIEN) 10 MG tablet Take 10 mg by mouth at bedtime as needed for sleep.   Yes [provider]  benzonatate (TESSALON) 100 MG capsule Take 1 capsule (100 mg total) by mouth every 8 (eight) hours. Patient not taking: Reported on 12/10/2019 07/31/19   Melene PlanFloyd, Dan, DO  HYDROcodone-acetaminophen (NORCO/VICODIN) 5-325 MG tablet Take 1-2 tablets by mouth every 6 (six) hours as needed for moderate pain or severe pain. Patient not taking: Reported on 07/31/2019 03/04/19   Chevis Prettyoth, Paul III, MD  neomycin-polymyxin-hydrocortisone (CORTISPORIN) 3.5-10000-1 OTIC suspension Place 4 drops into  the right ear 3 (three) times daily. Patient not taking: Reported on 12/10/2019 10/20/19   Darr, Veryl SpeakJacob E, PA-C  ondansetron (ZOFRAN ODT) 4 MG disintegrating tablet Take 1 tablet (4 mg total) by mouth every 8 (eight) hours as needed for nausea or vomiting. Patient not taking: Reported on 12/10/2019 07/31/19   Melene PlanFloyd, Dan, DO    Allergies    Patient has no known allergies.  Review of Systems   Review of Systems  All other systems reviewed and are negative.   Physical Exam Updated Vital Signs BP 128/75   Pulse 66   Temp 97.7 F (36.5 C) (Oral)   Resp (!) 23   Ht 1.549 m (5\' 1" )   Wt 127 kg   LMP 11/09/2019 (Approximate)   SpO2 97%   BMI 52.91 kg/m   Physical Exam Vitals and nursing note reviewed.  Constitutional:      General: She is not in acute distress.    Appearance: She is well-developed. She is obese.  HENT:     Head: Normocephalic and atraumatic.     Right Ear: External ear normal.     Left Ear: External ear normal.  Eyes:     General: No scleral icterus.       Right eye: No discharge.        Left eye: No discharge.     Conjunctiva/sclera: Conjunctivae normal.  Neck:     Trachea: No tracheal deviation.  Cardiovascular:     Rate and Rhythm: Normal rate and regular rhythm.  Pulmonary:     Effort: Pulmonary effort is normal. No respiratory distress.     Breath sounds: Normal breath sounds. No stridor. No wheezing or rales.  Abdominal:     General: Bowel sounds are normal. There is no distension.     Palpations: Abdomen is soft.     Tenderness: There is no abdominal tenderness. There is no guarding or rebound.  Musculoskeletal:        General: No tenderness.     Cervical back: Neck supple.  Skin:    General: Skin is warm and dry.     Findings: No rash.  Neurological:     Mental Status: She is alert.     Cranial Nerves: No cranial nerve deficit (no facial droop, extraocular movements intact, no slurred speech).     Sensory: No sensory deficit.     Motor: No  abnormal muscle tone or seizure activity.     Coordination: Coordination normal.     ED Results / Procedures / Treatments   Labs (all labs ordered are listed, but only abnormal results are displayed) Labs Reviewed  CBC - Abnormal; Notable for the following components:      Result Value   RBC 3.58 (*)    Hemoglobin 10.4 (*)  HCT 33.8 (*)    All other components within normal limits  BRAIN NATRIURETIC PEPTIDE - Abnormal; Notable for the following components:   B Natriuretic Peptide 105.4 (*)    All other components within normal limits  D-DIMER, QUANTITATIVE (NOT AT [facility]) - Abnormal; Notable for the following components:   D-Dimer, Quant 0.75 (*)    All other components within normal limits  RESPIRATORY PANEL BY RT PCR (FLU A&B, COVID)  BASIC METABOLIC PANEL  I-STAT BETA HCG BLOOD, ED ([facility], [facility], [facility] ONLY)  TROPONIN I (HIGH SENSITIVITY)  TROPONIN I (HIGH SENSITIVITY)    EKG EKG Interpretation  Date/Time:  Friday December 10 2019 08:14:33 EDT Ventricular Rate:  76 PR Interval:    QRS Duration: 100 QT Interval:  376 QTC Calculation: 423 R Axis:   34 Text Interpretation: Sinus rhythm Nonspecific T abnormalities, anterior leads No significant change since last tracing Confirmed by Linwood Dibbles [phone]) on 12/10/2019 8:41:08 AM   Radiology CT Angio Chest PE W and/or Wo Contrast  Result Date: 12/10/2019 CLINICAL DATA:  Short of breath, chills, chest pain, COVID-19 exposure EXAM: CT ANGIOGRAPHY CHEST WITH CONTRAST TECHNIQUE: Multidetector CT imaging of the chest was performed using the standard protocol during bolus administration of intravenous contrast. Multiplanar CT image reconstructions and MIPs were obtained to evaluate the vascular anatomy. CONTRAST:  OMNIPAQUE IOHEXOL 350 MG/ML SOLN COMPARISON:  12/10/2019, 12/30/2011 FINDINGS: Cardiovascular: This is a technically adequate evaluation of the pulmonary vasculature. No filling defects or pulmonary emboli. The heart is  enlarged without pericardial effusion. Normal caliber of the thoracic aorta. Mediastinum/Nodes: Thyroid, trachea, and esophagus are unremarkable. There are borderline enlarged bilateral axillary lymph nodes, largest on the left measuring 13 mm. No mediastinal or hilar adenopathy. Lungs/Pleura: Hypoventilatory changes at the lung bases. No airspace disease, effusion, or pneumothorax. Central airways are patent. Upper Abdomen: No acute abnormality. Musculoskeletal: No acute or destructive bony lesions. Reconstructed images demonstrate no additional findings. Review of the MIP images confirms the above findings. IMPRESSION: 1. No evidence of pulmonary embolus. 2. Cardiomegaly. 3. Borderline enlarged bilateral axillary lymph nodes, nonspecific. Electronically Signed   By: Sharlet Salina M.D.   On: 12/10/2019 16:06   DG Chest Portable 1 View  Result Date: 12/10/2019 CLINICAL DATA:  Shortness of breath. EXAM: PORTABLE CHEST 1 VIEW COMPARISON:  Jul 31, 2019. FINDINGS: Stable cardiomediastinal silhouette. No pneumothorax or pleural effusion is noted. Bilateral patchy airspace opacities noted on prior exam are mildly improved currently. Bony thorax is unremarkable. IMPRESSION: Mildly improved bilateral patchy airspace opacities compared to prior exam. Electronically Signed   By: Lupita Raider M.D.   On: 12/10/2019 08:52   VAS [place] LOWER EXTREMITY VENOUS (DVT) (ONLY [facility])  Result Date: 12/10/2019  Lower Venous DVTStudy Indications: Swelling, and D-dimer= 0.75.  Limitations: Body habitus, poor ultrasound/tissue interface and depth of vessels. Comparison Study: 12/24/2017- negative left lower extremity venous duplex Performing Technologist: Gertie Fey MHA, RDMS, RVT, RDCS  Examination Guidelines: A complete evaluation includes B-mode imaging, spectral Doppler, color Doppler, and power Doppler as needed of all accessible portions of each vessel. Bilateral testing is considered an integral part of a complete  examination. Limited examinations for reoccurring indications may be performed as noted. The reflux portion of the exam is performed with the patient in reverse Trendelenburg.  +---------+---------------+---------+-----------+----------+--------------+ RIGHT    CompressibilityPhasicitySpontaneityPropertiesThrombus Aging +---------+---------------+---------+-----------+----------+--------------+ CFV      Full           Yes      Yes                                 +---------+---------------+---------+-----------+----------+--------------+  FV Prox  Full                                                        +---------+---------------+---------+-----------+----------+--------------+ FV Mid   Full                                                        +---------+---------------+---------+-----------+----------+--------------+ FV DistalFull                                                        +---------+---------------+---------+-----------+----------+--------------+ POP      Full           Yes      Yes                                 +---------+---------------+---------+-----------+----------+--------------+ PTV      Full                                                        +---------+---------------+---------+-----------+----------+--------------+   Right Technical Findings: Not visualized segments include SFJ, PFV, peroneal veins.  +---------+--------------------+---------+-----------+----------+--------------+ LEFT     Compressibility     PhasicitySpontaneityPropertiesThrombus Aging +---------+--------------------+---------+-----------+----------+--------------+ CFV      Full                Yes      Yes                                 +---------+--------------------+---------+-----------+----------+--------------+ SFJ      Full                                                              +---------+--------------------+---------+-----------+----------+--------------+ FV Prox  Full                                                             +---------+--------------------+---------+-----------+----------+--------------+ FV Mid   Full                                                             +---------+--------------------+---------+-----------+----------+--------------+ FV DistalPatient unable to  Yes                                          tolerate compression                                             +---------+--------------------+---------+-----------+----------+--------------+ PFV      Full                                                             +---------+--------------------+---------+-----------+----------+--------------+ POP      Full                Yes      Yes                                 +---------+--------------------+---------+-----------+----------+--------------+ PTV      Full                         Yes                                 +---------+--------------------+---------+-----------+----------+--------------+   Left Technical Findings: Not visualized segments include peroneal veins.   Summary: RIGHT: - There is no evidence of deep vein thrombosis in the lower extremity. However, portions of this examination were limited- see technologist comments above.  - No cystic structure found in the popliteal fossa.  LEFT: - There is no evidence of deep vein thrombosis in the lower extremity. However, portions of this examination were limited- see technologist comments above.  - No cystic structure found in the popliteal fossa.  *See table(s) above for measurements and observations.    Preliminary     Procedures Procedures (including critical care time)  Medications Ordered in ED Medications  iohexol (OMNIPAQUE) 350 MG/ML injection 100 mL (100 mLs Intravenous Contrast Given 12/10/19 1531)    ED Course  I have  reviewed the triage vital signs and the nursing notes.  Pertinent labs & imaging results that were available during my care of the patient were reviewed by me and considered in my medical decision making (see chart for details).  Clinical Course as of Dec 10 1639  Fri Dec 10, 2019  2956 Chest x-ray shows slightly improved bilateral airspace opacities   [JK]  0955 Blood pressure down to 153/114 at the bedside   [JK]  1024 Covid and flu test negative   [JK]    Clinical Course User Index [JK] Linwood Dibbles, MD   MDM Rules/Calculators/A&P                          Ed workup reassuring.   Patient does not have any evidence of pneumonia.  No PE.  Covid test was negative.  BNP slightly elevated but no signs to suggest CHF on exam or CT scan. Stable for discharge. Final Clinical Impression(s) / ED Diagnoses Final diagnoses:  Dyspnea, unspecified type  Rx / DC Orders ED Discharge Orders    None       Linwood Dibbles, MD 12/10/19 1642

## 2019-12-10 NOTE — ED Triage Notes (Addendum)
Patient c/o SOB, chills, chest pain, headache, and generalized body aches x 3 days. Patient states she was exposed to Covid.  Patient has not had the Covid vaccine.

## 2020-03-11 ENCOUNTER — Emergency Department (HOSPITAL_COMMUNITY)
Admission: EM | Admit: 2020-03-11 | Discharge: 2020-03-11 | Disposition: A | Discharge status: Home / Self Care | Payer: Medicaid Other | Attending: Emergency Medicine | Admitting: Emergency Medicine

## 2020-03-11 ENCOUNTER — Emergency Department (HOSPITAL_COMMUNITY): Payer: Medicaid Other

## 2020-03-11 ENCOUNTER — Encounter (HOSPITAL_COMMUNITY): Payer: Self-pay

## 2020-03-11 ENCOUNTER — Other Ambulatory Visit: Payer: Self-pay

## 2020-03-11 DIAGNOSIS — S0083XA Contusion of other part of head, initial encounter: Secondary | ICD-10-CM | POA: Insufficient documentation

## 2020-03-11 DIAGNOSIS — Z79899 Other long term (current) drug therapy: Secondary | ICD-10-CM | POA: Diagnosis not present

## 2020-03-11 DIAGNOSIS — Y92009 Unspecified place in unspecified non-institutional (private) residence as the place of occurrence of the external cause: Secondary | ICD-10-CM | POA: Insufficient documentation

## 2020-03-11 DIAGNOSIS — I10 Essential (primary) hypertension: Secondary | ICD-10-CM | POA: Insufficient documentation

## 2020-03-11 DIAGNOSIS — S0990XA Unspecified injury of head, initial encounter: Secondary | ICD-10-CM | POA: Insufficient documentation

## 2020-03-11 DIAGNOSIS — W19XXXA Unspecified fall, initial encounter: Secondary | ICD-10-CM | POA: Diagnosis not present

## 2020-03-11 LAB — CBG MONITORING, ED: Glucose-Capillary: 88 mg/dL (ref 70–99)

## 2020-03-11 NOTE — ED Notes (Addendum)
Pt lethargic and continues to fall asleep. s1 s2 clear

## 2020-03-11 NOTE — ED Notes (Signed)
Visually checked on Pt normal breathing pattern

## 2020-03-11 NOTE — ED Provider Notes (Signed)
[facility] EMERGENCY DEPARTMENT Provider Note   CSN: [id number] Arrival date & time: 03/11/20  7824     History Chief Complaint  Patient presents with  . Fall    Vanessa Morton is a 34 y.o. female with a past medical history of obesity, anxiety presenting to the ED with a chief complaint of fall.  Reports mechanical fall that occurred earlier tonight after tripping on something on the ground in the hallway of her home.  She reports hitting her head on the wall.  She reports pain to the right side of her face as well as the right side of her mouth.  She denies any loss of consciousness.  States that she has not been able to sleep all night and is drowsy as a result of this.  Denies any anticoagulant use.  No vomiting, neck stiffness, chest pain, shortness of breath or changes to gait.  She has not taken any medicine for pain.  No vision changes.  She reports chronic pain related to her neuropathy.  HPI     Past Medical History:  Diagnosis Date  . Abscess   . Anxiety   . Chest pain   . Gonorrhea   . Hypertension   . Morbid obesity (HCC)   . Obesity   . Urinary tract infection     Patient Active Problem List   Diagnosis Date Noted  . Abscess of back 10/20/2018  . Axillary abscess 10/20/2018  . Morbid obesity (HCC) 10/20/2018  . Rash/skin eruption 10/20/2018    Past Surgical History:  Procedure Laterality Date  . CESAREAN SECTION    . HYDRADENITIS EXCISION N/A 03/04/2019   Procedure: EXCISION HIDRADENITIS RIGHT AXILLA X2 AND BACK X2;  Surgeon: Griselda Miner, MD;  Location: [facility] OR;  Service: General;  Laterality: N/A;  . TONSILLECTOMY       OB History   No obstetric history on file.     Family History  Problem Relation Age of Onset  . Hypertension Mother   . Diabetes Mother   . Diabetes Other   . Hypertension Other     Social History   Tobacco Use  . Smoking status: Never Smoker  . Smokeless tobacco: Never Used  Vaping Use  . Vaping  Use: Never used  Substance Use Topics  . Alcohol use: No  . Drug use: No    Home Medications Prior to Admission medications   Medication Sig Start Date End Date Taking? Authorizing Provider  benzonatate (TESSALON) 100 MG capsule Take 1 capsule (100 mg total) by mouth every 8 (eight) hours. Patient not taking: Reported on 12/10/2019 07/31/19   Melene Plan, DO  Buprenorphine HCl-Naloxone HCl 8-2 MG FILM Take 1 Film by mouth 3 (three) times daily.  07/02/18   [provider]  diazepam (VALIUM) 10 MG tablet Take 10 mg by mouth every 6 (six) hours as needed for anxiety.    [provider]  furosemide (LASIX) 20 MG tablet Take 1 tablet (20 mg total) by mouth daily. 12/02/19   Coralyn Mark, NP  gabapentin (NEURONTIN) 100 MG capsule Take 100 mg by mouth 3 (three) times daily. Takes along with 300 mg capsule to make total 400 mg.    [provider]  gabapentin (NEURONTIN) 300 MG capsule Take 300 mg by mouth in the morning, at noon, and at bedtime. Takes along with 100 mg capsule to make 400 mg. 01/25/19   [provider]  hydrochlorothiazide (HYDRODIURIL) 25 MG tablet  Take 25 mg by mouth every morning. 11/25/19   [provider]  HYDROcodone-acetaminophen (NORCO/VICODIN) 5-325 MG tablet Take 1-2 tablets by mouth every 6 (six) hours as needed for moderate pain or severe pain. Patient not taking: Reported on 07/31/2019 03/04/19   Chevis Pretty III, MD  ibuprofen (ADVIL) 200 MG tablet Take 200 mg by mouth every 6 (six) hours as needed for moderate pain.    [provider]  losartan (COZAAR) 50 MG tablet Take 50 mg by mouth daily. 10/29/19   [provider]  magnesium citrate SOLN Take 1 Bottle by mouth as needed for mild constipation.  12/02/19   [provider]  neomycin-polymyxin-hydrocortisone (CORTISPORIN) 3.5-10000-1 OTIC suspension Place 4 drops into the right ear 3 (three) times daily. Patient not taking: Reported on 12/10/2019 10/20/19    Darr, Gerilyn Pilgrim, PA-C  NYSTATIN powder Apply 1 application topically 3 (three) times daily as needed (skin irritation).  09/28/19   [provider]  ondansetron (ZOFRAN ODT) 4 MG disintegrating tablet Take 1 tablet (4 mg total) by mouth every 8 (eight) hours as needed for nausea or vomiting. Patient not taking: Reported on 12/10/2019 07/31/19   Melene Plan, DO  phentermine (ADIPEX-P) 37.5 MG tablet Take 37.5 mg by mouth every morning.  06/25/19   [provider]  topiramate (TOPAMAX) 50 MG tablet Take 50 mg by mouth at bedtime as needed (headaches).     [provider]  zolpidem (AMBIEN) 10 MG tablet Take 10 mg by mouth at bedtime as needed for sleep.    [provider]    Allergies    Patient has no known allergies.  Review of Systems   Review of Systems  Constitutional: Negative for chills and fever.  HENT: Positive for facial swelling.   Respiratory: Negative for shortness of breath.   Cardiovascular: Negative for chest pain.  Gastrointestinal: Negative for nausea and vomiting.  Musculoskeletal: Positive for myalgias.  Neurological: Positive for headaches.    Physical Exam Updated Vital Signs BP 109/62   Pulse 70   Temp 98 F (36.7 C) (Oral)   Resp (!) 28   Ht 5\' 1"  (1.549 m)   Wt 122.5 kg   SpO2 97%   BMI 51.02 kg/m   Physical Exam Vitals and nursing note reviewed.  Constitutional:      General: She is not in acute distress.    Appearance: She is well-developed and well-nourished.     Comments: Appears sleepy, she states because she hasn't slept all night.  HENT:     Head: Normocephalic.      Comments: Tenderness of the right side of the face in the indicated area.  No epistaxis noted.  No raccoon eyes.    Nose: Nose normal.  Eyes:     General: No scleral icterus.       Right eye: No discharge.        Left eye: No discharge.     Extraocular Movements: EOM normal.     Conjunctiva/sclera: Conjunctivae normal.     Pupils: Pupils are  equal, round, and reactive to light.  Cardiovascular:     Rate and Rhythm: Normal rate and regular rhythm.     Pulses: Intact distal pulses.     Heart sounds: Normal heart sounds. No murmur heard. No friction rub. No gallop.   Pulmonary:     Effort: Pulmonary effort is normal. No respiratory distress.     Breath sounds: Normal breath sounds.  Abdominal:  General: Bowel sounds are normal. There is no distension.     Palpations: Abdomen is soft.     Tenderness: There is no abdominal tenderness. There is no guarding.  Musculoskeletal:        General: No edema. Normal range of motion.     Cervical back: Normal range of motion and neck supple.     Comments: No C, T, L spine tenderness to palpation.  Skin:    General: Skin is warm and dry.     Findings: No rash.  Neurological:     Mental Status: She is alert and oriented to person, place, and time.     Cranial Nerves: No cranial nerve deficit.     Sensory: No sensory deficit.     Motor: No weakness or abnormal muscle tone.     Coordination: Coordination normal.     Comments: Alert, oriented x3.  Strength 5/5 in bilateral upper and lower extremities.  No facial asymmetry noted.  Normal sensation to light touch of face, bilateral upper and lower extremities.  Psychiatric:        Mood and Affect: Mood and affect normal.     ED Results / Procedures / Treatments   Labs (all labs ordered are listed, but only abnormal results are displayed) Labs Reviewed  CBG MONITORING, ED    EKG None  Radiology CT Head Wo Contrast  Result Date: 03/11/2020 CLINICAL DATA:  Fall this morning with head and facial trauma. EXAM: CT HEAD WITHOUT CONTRAST CT MAXILLOFACIAL WITHOUT CONTRAST TECHNIQUE: Multidetector CT imaging of the head and maxillofacial structures were performed using the standard protocol without intravenous contrast. Multiplanar CT image reconstructions of the maxillofacial structures were also generated. COMPARISON:  None. FINDINGS:  CT HEAD FINDINGS Brain: Ventricles, cisterns and other CSF spaces are normal. There is no mass, mass effect, shift of midline structures or acute hemorrhage. No evidence of acute infarction. Vascular: No hyperdense vessel or unexpected calcification. Skull: Normal. Negative for fracture or focal lesion. Other: Small scalp contusion over the right frontal region. CT MAXILLOFACIAL FINDINGS Osseous: No acute fracture. Dental carie over the most posterior right upper molar tooth. Orbits: Negative. No traumatic or inflammatory finding. Sinuses: Paranasal sinuses are well developed and well aerated with minimal mucosal membrane thickening over the floor the right maxillary sinus. Mastoid air cells are clear. Soft tissues: Minimal soft tissue swelling over the right supraorbital region/frontal scalp. IMPRESSION: 1. No acute brain injury. Small right frontal scalp contusion. 2. No acute facial bone fracture. 3. Minimal chronic sinus inflammatory change. Electronically Signed   By: Elberta Fortis M.D.   On: 03/11/2020 09:31   CT Maxillofacial Wo Contrast  Result Date: 03/11/2020 CLINICAL DATA:  Fall this morning with head and facial trauma. EXAM: CT HEAD WITHOUT CONTRAST CT MAXILLOFACIAL WITHOUT CONTRAST TECHNIQUE: Multidetector CT imaging of the head and maxillofacial structures were performed using the standard protocol without intravenous contrast. Multiplanar CT image reconstructions of the maxillofacial structures were also generated. COMPARISON:  None. FINDINGS: CT HEAD FINDINGS Brain: Ventricles, cisterns and other CSF spaces are normal. There is no mass, mass effect, shift of midline structures or acute hemorrhage. No evidence of acute infarction. Vascular: No hyperdense vessel or unexpected calcification. Skull: Normal. Negative for fracture or focal lesion. Other: Small scalp contusion over the right frontal region. CT MAXILLOFACIAL FINDINGS Osseous: No acute fracture. Dental carie over the most posterior right  upper molar tooth. Orbits: Negative. No traumatic or inflammatory finding. Sinuses: Paranasal sinuses are well developed and well  aerated with minimal mucosal membrane thickening over the floor the right maxillary sinus. Mastoid air cells are clear. Soft tissues: Minimal soft tissue swelling over the right supraorbital region/frontal scalp. IMPRESSION: 1. No acute brain injury. Small right frontal scalp contusion. 2. No acute facial bone fracture. 3. Minimal chronic sinus inflammatory change. Electronically Signed   By: Elberta Fortisaniel  Boyle M.D.   On: 03/11/2020 09:31    Procedures Procedures (including critical care time)  Medications Ordered in ED Medications - No data to display  ED Course  I have reviewed the triage vital signs and the nursing notes.  Pertinent labs & imaging results that were available during my care of the patient were reviewed by me and considered in my medical decision making (see chart for details).    MDM Rules/Calculators/A&P                          34 year old female with past medical history of obesity, anxiety presenting to the ED with a chief complaint of fall.  Mechanical fall that occurred earlier tonight and hit the right side of her face on the wall.  She denies any loss of consciousness but states that she feels drowsy because she has not slept all night.  No anticoagulant use.  No neck stiffness, vomiting, vision changes.  On exam patient has some tenderness to the right side of her face.  No pain with EOMs.  She is moving her eyes without any difficulty.  No weakness or numbness noted in upper and lower extremities.  No facial asymmetry noted.  No C, T or L-spine tenderness to palpation.  CT of the head and maxillofacial without any traumatic injuries or fractures. Patient was counseled on head injury precautions and symptoms that should indicate their return to the ED.  These include severe worsening headache, vision changes, confusion, loss of consciousness,  trouble walking, nausea & vomiting, or weakness/tingling in extremities.  We will have her take Tylenol at home and use ice to help if there is any facial pain.  Return precautions given.   Patient is hemodynamically stable, in NAD, and able to ambulate in the ED. Evaluation does not show pathology that would require ongoing emergent intervention or inpatient treatment. I explained the diagnosis to the patient. Pain has been managed and has no complaints prior to discharge. Patient is comfortable with above plan and is stable for discharge at this time. All questions were answered prior to disposition. Strict return precautions for returning to the ED were discussed. Encouraged follow up with PCP.   An After Visit Summary was printed and given to the patient.   Portions of this note were generated with Scientist, clinical (histocompatibility and immunogenetics)Dragon dictation software. Dictation errors may occur despite best attempts at proofreading.  Final Clinical Impression(s) / ED Diagnoses Final diagnoses:  Injury of head, initial encounter  Fall in home, initial encounter  Contusion of face, initial encounter    Rx / DC Orders ED Discharge Orders    None       Dietrich PatesKhatri, Sheamus Hasting, PA-C 03/11/20 0941    Vanetta MuldersZackowski, Scott, MD 03/25/20 2351

## 2020-03-11 NOTE — Discharge Instructions (Signed)
Please follow instructions regarding your head injury precautions. Take Tylenol as needed to help with pain. Return to the ER if you start to experience worsening headache, blurry vision, additional injuries, neck stiffness.

## 2020-03-11 NOTE — ED Notes (Signed)
Back from CT

## 2020-03-11 NOTE — ED Triage Notes (Signed)
Pt reports fall this morning with LOC. Hit her head on the wall, hematoma noted to right side of forehead, bruising under right eye and busted the right side of her mouth, no bleeding at this time. No other injuries from fall. Pt ambulatory.

## 2020-06-13 ENCOUNTER — Encounter (HOSPITAL_COMMUNITY): Payer: Self-pay

## 2020-06-13 ENCOUNTER — Other Ambulatory Visit: Payer: Self-pay

## 2020-06-13 ENCOUNTER — Emergency Department (HOSPITAL_COMMUNITY): Payer: Medicaid Other

## 2020-06-13 ENCOUNTER — Emergency Department (HOSPITAL_COMMUNITY)
Admission: EM | Admit: 2020-06-13 | Discharge: 2020-06-13 | Disposition: A | Discharge status: Home / Self Care | Payer: Medicaid Other | Attending: Emergency Medicine | Admitting: Emergency Medicine

## 2020-06-13 DIAGNOSIS — M79605 Pain in left leg: Secondary | ICD-10-CM

## 2020-06-13 DIAGNOSIS — I10 Essential (primary) hypertension: Secondary | ICD-10-CM | POA: Diagnosis not present

## 2020-06-13 DIAGNOSIS — Z79899 Other long term (current) drug therapy: Secondary | ICD-10-CM | POA: Insufficient documentation

## 2020-06-13 DIAGNOSIS — W182XXA Fall in (into) shower or empty bathtub, initial encounter: Secondary | ICD-10-CM | POA: Diagnosis not present

## 2020-06-13 DIAGNOSIS — M7989 Other specified soft tissue disorders: Secondary | ICD-10-CM | POA: Diagnosis not present

## 2020-06-13 MED ORDER — ACETAMINOPHEN 500 MG PO TABS
1000.0000 mg | ORAL_TABLET | Freq: Once | ORAL | Status: AC
  Administered 2020-06-13: 1000 mg via ORAL
  Filled 2020-06-13: qty 2

## 2020-06-13 MED ORDER — IBUPROFEN 800 MG PO TABS
800.0000 mg | ORAL_TABLET | Freq: Once | ORAL | Status: AC
  Administered 2020-06-13: 800 mg via ORAL
  Filled 2020-06-13: qty 1

## 2020-06-13 MED ORDER — OXYCODONE HCL 5 MG PO TABS
5.0000 mg | ORAL_TABLET | Freq: Once | ORAL | Status: AC
  Administered 2020-06-13: 5 mg via ORAL
  Filled 2020-06-13: qty 1

## 2020-06-13 NOTE — ED Provider Notes (Signed)
[facility]-EMERGENCY DEPT Provider Note   CSN: [id number] Arrival date & time: 06/13/20  0801     History Chief Complaint  Patient presents with  . Leg Pain    Vanessa Morton is a 35 y.o. female.  35 yo F with a chief complaints of a fall in the bathtub.  This happened about 7 hours ago.  Complaining of pain to the left leg.  She denies other injury in the fall.  Has some pain with bearing weight.  The history is provided by the patient.  Leg Pain Location:  Leg Time since incident:  2 days Leg location:  L lower leg Pain details:    Quality:  Sharp and shooting   Radiates to:  Does not radiate   Severity:  Moderate   Onset quality:  Gradual   Duration:  6 hours   Timing:  Constant   Progression:  Worsening Chronicity:  New Dislocation: no   Prior injury to area:  No Relieved by:  Nothing Worsened by:  Bearing weight, flexion and extension Ineffective treatments:  None tried Associated symptoms: no fever        Past Medical History:  Diagnosis Date  . Abscess   . Anxiety   . Chest pain   . Gonorrhea   . Hypertension   . Morbid obesity (HCC)   . Obesity   . Urinary tract infection     Patient Active Problem List   Diagnosis Date Noted  . Abscess of back 10/20/2018  . Axillary abscess 10/20/2018  . Morbid obesity (HCC) 10/20/2018  . Rash/skin eruption 10/20/2018    Past Surgical History:  Procedure Laterality Date  . CESAREAN SECTION    . HYDRADENITIS EXCISION N/A 03/04/2019   Procedure: EXCISION HIDRADENITIS RIGHT AXILLA X2 AND BACK X2;  Surgeon: Griselda Miner, MD;  Location: [facility] OR;  Service: General;  Laterality: N/A;  . TONSILLECTOMY       OB History   No obstetric history on file.     Family History  Problem Relation Age of Onset  . Hypertension Mother   . Diabetes Mother   . Diabetes Other   . Hypertension Other     Social History   Tobacco Use  . Smoking status: Never Smoker  . Smokeless tobacco: Never  Used  Vaping Use  . Vaping Use: Never used  Substance Use Topics  . Alcohol use: No  . Drug use: No    Home Medications Prior to Admission medications   Medication Sig Start Date End Date Taking? Authorizing Provider  benzonatate (TESSALON) 100 MG capsule Take 1 capsule (100 mg total) by mouth every 8 (eight) hours. Patient not taking: Reported on 12/10/2019 07/31/19   Melene Plan, DO  Buprenorphine HCl-Naloxone HCl 8-2 MG FILM Take 1 Film by mouth 3 (three) times daily.  07/02/18   [provider]  diazepam (VALIUM) 10 MG tablet Take 10 mg by mouth every 6 (six) hours as needed for anxiety.    [provider]  furosemide (LASIX) 20 MG tablet Take 1 tablet (20 mg total) by mouth daily. 12/02/19   Coralyn Mark, NP  gabapentin (NEURONTIN) 100 MG capsule Take 100 mg by mouth 3 (three) times daily. Takes along with 300 mg capsule to make total 400 mg.    [provider]  gabapentin (NEURONTIN) 300 MG capsule Take 300 mg by mouth in the morning, at noon, and at bedtime. Takes along with 100 mg capsule to make  400 mg. 01/25/19   [provider]  hydrochlorothiazide (HYDRODIURIL) 25 MG tablet Take 25 mg by mouth every morning. 11/25/19   [provider]  HYDROcodone-acetaminophen (NORCO/VICODIN) 5-325 MG tablet Take 1-2 tablets by mouth every 6 (six) hours as needed for moderate pain or severe pain. Patient not taking: Reported on 07/31/2019 03/04/19   Chevis Pretty III, MD  ibuprofen (ADVIL) 200 MG tablet Take 200 mg by mouth every 6 (six) hours as needed for moderate pain.    [provider]  losartan (COZAAR) 50 MG tablet Take 50 mg by mouth daily. 10/29/19   [provider]  magnesium citrate SOLN Take 1 Bottle by mouth as needed for mild constipation.  12/02/19   [provider]  neomycin-polymyxin-hydrocortisone (CORTISPORIN) 3.5-10000-1 OTIC suspension Place 4 drops into the right ear 3 (three) times daily. Patient not taking:  Reported on 12/10/2019 10/20/19   Darr, Gerilyn Pilgrim, PA-C  NYSTATIN powder Apply 1 application topically 3 (three) times daily as needed (skin irritation).  09/28/19   [provider]  ondansetron (ZOFRAN ODT) 4 MG disintegrating tablet Take 1 tablet (4 mg total) by mouth every 8 (eight) hours as needed for nausea or vomiting. Patient not taking: Reported on 12/10/2019 07/31/19   Melene Plan, DO  phentermine (ADIPEX-P) 37.5 MG tablet Take 37.5 mg by mouth every morning.  06/25/19   [provider]  topiramate (TOPAMAX) 50 MG tablet Take 50 mg by mouth at bedtime as needed (headaches).     [provider]  zolpidem (AMBIEN) 10 MG tablet Take 10 mg by mouth at bedtime as needed for sleep.    [provider]    Allergies    Patient has no known allergies.  Review of Systems   Review of Systems  Constitutional: Negative for chills and fever.  HENT: Negative for congestion and rhinorrhea.   Eyes: Negative for redness and visual disturbance.  Respiratory: Negative for shortness of breath and wheezing.   Cardiovascular: Negative for chest pain and palpitations.  Gastrointestinal: Negative for nausea and vomiting.  Genitourinary: Negative for dysuria and urgency.  Musculoskeletal: Positive for arthralgias. Negative for myalgias.  Skin: Negative for pallor and wound.  Neurological: Negative for dizziness and headaches.    Physical Exam Updated Vital Signs BP (!) 141/65 (BP Location: Right Arm)   Pulse 76   Temp 97.7 F (36.5 C) (Oral)   Resp 18   Ht 5\' 1"  (1.549 m)   Wt 122 kg   LMP 05/30/2020   SpO2 100%   BMI 50.82 kg/m   Physical Exam Vitals and nursing note reviewed.  Constitutional:      General: She is not in acute distress.    Appearance: She is well-developed. She is not diaphoretic.     Comments: BMI 51  HENT:     Head: Normocephalic and atraumatic.  Eyes:     Pupils: Pupils are equal, round, and reactive to light.  Cardiovascular:     Rate and  Rhythm: Normal rate and regular rhythm.     Heart sounds: No murmur heard. No friction rub. No gallop.   Pulmonary:     Effort: Pulmonary effort is normal.     Breath sounds: No wheezing or rales.  Abdominal:     General: There is no distension.     Palpations: Abdomen is soft.     Tenderness: There is no abdominal tenderness.  Musculoskeletal:        General: Swelling and tenderness present.  Cervical back: Normal range of motion and neck supple.     Comments: Pain and swelling to the left leg below the knee.  Pain along the proximal fibula and tibia.  Mild edema.  No obvious deformity.  Pulse motor and sensation intact distally.  Skin:    General: Skin is warm and dry.  Neurological:     Mental Status: She is alert and oriented to person, place, and time.  Psychiatric:        Behavior: Behavior normal.     ED Results / Procedures / Treatments   Labs (all labs ordered are listed, but only abnormal results are displayed) Labs Reviewed - No data to display  EKG None  Radiology DG Tibia/Fibula Left  Result Date: 06/13/2020 CLINICAL DATA:  Left leg pain status post fall And swelling and bruising of left shin EXAM: LEFT TIBIA AND FIBULA - 2 VIEW COMPARISON:  None. FINDINGS: There is no evidence of fracture or other focal bone lesions. Soft tissues are unremarkable. The distal tip of the medial and lateral malleoli are not visualized on the frontal view. IMPRESSION: 1. No acute abnormality of the tibia or fibula. 2. If the patient has tenderness of the ankle, dedicated ankle radiograph should be obtained. Electronically Signed   By: Acquanetta Belling M.D.   On: 06/13/2020 08:38    Procedures Procedures   Medications Ordered in ED Medications  acetaminophen (TYLENOL) tablet 1,000 mg (1,000 mg Oral Given 06/13/20 0824)  ibuprofen (ADVIL) tablet 800 mg (800 mg Oral Given 06/13/20 0824)  oxyCODONE (Oxy IR/ROXICODONE) immediate release tablet 5 mg (5 mg Oral Given 06/13/20 4854)    ED  Course  I have reviewed the triage vital signs and the nursing notes.  Pertinent labs & imaging results that were available during my care of the patient were reviewed by me and considered in my medical decision making (see chart for details).    MDM Rules/Calculators/A&P                          35 yo F with a cc of left leg pain post fall.  Will xray.   X-ray viewed by me negative for fracture.  Not a great view of the ankle on x-ray though patient without any pain along the malleolus and the bruising is localized to the proximal aspect of the lower leg.  Do not feel that further imaging is warranted.  We will have the patient use crutches.  PCP follow-up.  8:48 AM:  I have discussed the diagnosis/risks/treatment options with the patient and believe the pt to be eligible for discharge home to follow-up with PCP. We also discussed returning to the ED immediately if new or worsening sx occur. We discussed the sx which are most concerning (e.g., sudden worsening pain, fever, inability to tolerate by mouth) that necessitate immediate return. Medications administered to the patient during their visit and any new prescriptions provided to the patient are listed below.  Medications given during this visit Medications  acetaminophen (TYLENOL) tablet 1,000 mg (1,000 mg Oral Given 06/13/20 0824)  ibuprofen (ADVIL) tablet 800 mg (800 mg Oral Given 06/13/20 0824)  oxyCODONE (Oxy IR/ROXICODONE) immediate release tablet 5 mg (5 mg Oral Given 06/13/20 6270)     The patient appears reasonably screen and/or stabilized for discharge and I doubt any other medical condition or other [facility] requiring further screening, evaluation, or treatment in the ED at this time prior to discharge.  Final Clinical Impression(s) / ED Diagnoses Final diagnoses:  Left leg pain    Rx / DC Orders ED Discharge Orders    None       Melene PlanFloyd, Jamariyah Johannsen, DO 06/13/20 [id number]

## 2020-06-13 NOTE — ED Triage Notes (Signed)
Fall in bath tub @ 1am. Swelling noted to left leg below knee. Tylenol and ibuprofen with no relief. Pt able to stand and pivot to bed.

## 2020-06-13 NOTE — Discharge Instructions (Signed)
Take 4 over the counter ibuprofen tablets 3 times a day or 2 over-the-counter naproxen tablets twice a day for pain. Also take tylenol 1000mg(2 extra strength) four times a day.    Follow up with your family doc.    

## 2020-06-19 ENCOUNTER — Other Ambulatory Visit: Payer: Self-pay | Admitting: Nurse Practitioner

## 2020-06-19 DIAGNOSIS — M79605 Pain in left leg: Secondary | ICD-10-CM

## 2020-06-27 ENCOUNTER — Ambulatory Visit
Admission: RE | Admit: 2020-06-27 | Discharge: 2020-06-27 | Disposition: A | Discharge status: Home / Self Care | Payer: Medicaid Other | Source: Ambulatory Visit | Attending: Nurse Practitioner | Admitting: Nurse Practitioner

## 2020-06-27 DIAGNOSIS — M79605 Pain in left leg: Secondary | ICD-10-CM

## 2020-09-30 ENCOUNTER — Emergency Department (HOSPITAL_COMMUNITY)
Admission: EM | Admit: 2020-09-30 | Discharge: 2020-09-30 | Disposition: A | Discharge status: Home / Self Care | Payer: Medicaid Other | Attending: Emergency Medicine | Admitting: Emergency Medicine

## 2020-09-30 DIAGNOSIS — R220 Localized swelling, mass and lump, head: Secondary | ICD-10-CM | POA: Diagnosis present

## 2020-09-30 DIAGNOSIS — L539 Erythematous condition, unspecified: Secondary | ICD-10-CM | POA: Insufficient documentation

## 2020-09-30 DIAGNOSIS — Z79899 Other long term (current) drug therapy: Secondary | ICD-10-CM | POA: Diagnosis not present

## 2020-09-30 DIAGNOSIS — I10 Essential (primary) hypertension: Secondary | ICD-10-CM | POA: Insufficient documentation

## 2020-09-30 DIAGNOSIS — T63441A Toxic effect of venom of bees, accidental (unintentional), initial encounter: Secondary | ICD-10-CM

## 2020-09-30 DIAGNOSIS — W57XXXA Bitten or stung by nonvenomous insect and other nonvenomous arthropods, initial encounter: Secondary | ICD-10-CM | POA: Insufficient documentation

## 2020-09-30 MED ORDER — IBUPROFEN 200 MG PO TABS
600.0000 mg | ORAL_TABLET | Freq: Once | ORAL | Status: AC
  Administered 2020-09-30: 600 mg via ORAL
  Filled 2020-09-30: qty 3

## 2020-09-30 MED ORDER — PREDNISONE 20 MG PO TABS: 60.0000 mg | ORAL_TABLET | Freq: Every day | ORAL | 0 refills | Status: DC

## 2020-09-30 MED ORDER — PREDNISONE 20 MG PO TABS
60.0000 mg | ORAL_TABLET | Freq: Once | ORAL | Status: AC
  Administered 2020-09-30: 60 mg via ORAL
  Filled 2020-09-30: qty 3

## 2020-09-30 NOTE — ED Triage Notes (Signed)
Pt states "I was attacked by some wasp." Pt reports multiple bites. Pt reports burning and tingling sensation. Pt reports pain. Pt states she fell while trying to get away from wasp.

## 2020-09-30 NOTE — Discharge Instructions (Addendum)
You came to the emergency department today to be evaluated for your insect stings.  Your physical exam was reassuring.  You have no signs of an allergic reaction.  I have given you a dose of prednisone in the emergency department to help reduce your swelling over the next few days.  I have also given you prescription for the same medication.  Please take this medication as prescribed.  Some common side effects include feelings of extra energy, feeling warm, increased appetite, and stomach upset.  If you are diabetic your sugars may run higher than usual.   Please take Ibuprofen (Advil, motrin) and Tylenol (acetaminophen) to relieve your pain.    You may take up to 600 MG (3 pills) of normal strength ibuprofen every 8 hours as needed.   You make take tylenol, up to 1,000 mg (two extra strength pills) every 8 hours as needed.   It is safe to take ibuprofen and tylenol at the same time as they work differently.   Do not take more than 3,000 mg tylenol in a 24 hour period (not more than one dose every 8 hours.  Please check all medication labels as many medications such as pain and cold medications may contain tylenol.  Do not drink alcohol while taking these medications.  Do not take other NSAID'S while taking ibuprofen (such as aleve or naproxen).  Please take ibuprofen with food to decrease stomach upset.  Get help right away if: You have symptoms of a severe allergic reaction. These include: Wheezing or difficulty breathing. Tightness in the chest or chest pain. Light-headedness or fainting. Itchy, raised, red patches on the skin. Nausea or vomiting. Abdominal cramping. Diarrhea. A swollen tongue or lips, or trouble swallowing. Dizziness or fainting.

## 2020-09-30 NOTE — ED Provider Notes (Signed)
[facility]-EMERGENCY DEPT Provider Note   CSN: [id number] Arrival date & time: 09/30/20  1143     History No chief complaint on file.   Vanessa Morton is a 35 y.o. female with medical history as outlined below.  Patient presents with a chief complaint of insect sting.  Patient reports that she was stung by a wasp or hornet.  Stings.  Approximate 30 minutes prior to arrival.  Patient estimates that she was stung approximately 8-10 times.  Patient states that she was stung to bilateral upper and lower extremities, back of her head, and right side of her face.  Patient complains of pain and swelling to sting locations.  Patient denies any known history of allergies to bee stings.  Patient denies any trouble swallowing, shortness of breath, angioedema, nausea, vomiting, diarrhea, hives.  HPI     Past Medical History:  Diagnosis Date   Abscess    Anxiety    Chest pain    Gonorrhea    Hypertension    Morbid obesity (HCC)    Obesity    Urinary tract infection     Patient Active Problem List   Diagnosis Date Noted   Abscess of back 10/20/2018   Axillary abscess 10/20/2018   Morbid obesity (HCC) 10/20/2018   Rash/skin eruption 10/20/2018    Past Surgical History:  Procedure Laterality Date   CESAREAN SECTION     HYDRADENITIS EXCISION N/A 03/04/2019   Procedure: EXCISION HIDRADENITIS RIGHT AXILLA X2 AND BACK X2;  Surgeon: Griselda Miner, MD;  Location: [facility] OR;  Service: General;  Laterality: N/A;   TONSILLECTOMY       OB History   No obstetric history on file.     Family History  Problem Relation Age of Onset   Hypertension Mother    Diabetes Mother    Diabetes Other    Hypertension Other     Social History   Tobacco Use   Smoking status: Never   Smokeless tobacco: Never  Vaping Use   Vaping Use: Never used  Substance Use Topics   Alcohol use: No   Drug use: No    Home Medications Prior to Admission medications   Medication Sig  Start Date End Date Taking? Authorizing Provider  benzonatate (TESSALON) 100 MG capsule Take 1 capsule (100 mg total) by mouth every 8 (eight) hours. Patient not taking: Reported on 12/10/2019 07/31/19   Melene Plan, DO  Buprenorphine HCl-Naloxone HCl 8-2 MG FILM Take 1 Film by mouth 3 (three) times daily.  07/02/18   [provider]  diazepam (VALIUM) 10 MG tablet Take 10 mg by mouth every 6 (six) hours as needed for anxiety.    [provider]  furosemide (LASIX) 20 MG tablet Take 1 tablet (20 mg total) by mouth daily. 12/02/19   Coralyn Mark, NP  gabapentin (NEURONTIN) 100 MG capsule Take 100 mg by mouth 3 (three) times daily. Takes along with 300 mg capsule to make total 400 mg.    [provider]  gabapentin (NEURONTIN) 300 MG capsule Take 300 mg by mouth in the morning, at noon, and at bedtime. Takes along with 100 mg capsule to make 400 mg. 01/25/19   [provider]  hydrochlorothiazide (HYDRODIURIL) 25 MG tablet Take 25 mg by mouth every morning. 11/25/19   [provider]  HYDROcodone-acetaminophen (NORCO/VICODIN) 5-325 MG tablet Take 1-2 tablets by mouth every 6 (six) hours as needed for moderate pain or severe pain. Patient not taking:  Reported on 07/31/2019 03/04/19   Chevis Pretty III, MD  ibuprofen (ADVIL) 200 MG tablet Take 200 mg by mouth every 6 (six) hours as needed for moderate pain.    [provider]  losartan (COZAAR) 50 MG tablet Take 50 mg by mouth daily. 10/29/19   [provider]  magnesium citrate SOLN Take 1 Bottle by mouth as needed for mild constipation.  12/02/19   [provider]  neomycin-polymyxin-hydrocortisone (CORTISPORIN) 3.5-10000-1 OTIC suspension Place 4 drops into the right ear 3 (three) times daily. Patient not taking: Reported on 12/10/2019 10/20/19   Darr, Gerilyn Pilgrim, PA-C  NYSTATIN powder Apply 1 application topically 3 (three) times daily as needed (skin irritation).  09/28/19   [provider]  ondansetron (ZOFRAN ODT) 4 MG disintegrating tablet Take 1 tablet (4 mg total) by mouth every 8 (eight) hours as needed for nausea or vomiting. Patient not taking: Reported on 12/10/2019 07/31/19   Melene Plan, DO  phentermine (ADIPEX-P) 37.5 MG tablet Take 37.5 mg by mouth every morning.  06/25/19   [provider]  topiramate (TOPAMAX) 50 MG tablet Take 50 mg by mouth at bedtime as needed (headaches).     [provider]  zolpidem (AMBIEN) 10 MG tablet Take 10 mg by mouth at bedtime as needed for sleep.    [provider]    Allergies    Patient has no known allergies.  Review of Systems   Review of Systems  HENT:  Positive for facial swelling. Negative for trouble swallowing.   Respiratory:  Negative for shortness of breath.   Gastrointestinal:  Negative for diarrhea, nausea and vomiting.  Skin:  Negative for rash.   Physical Exam Updated Vital Signs There were no vitals taken for this visit.  Physical Exam Vitals and nursing note reviewed.  Constitutional:      General: She is not in acute distress.    Appearance: She is not ill-appearing, toxic-appearing or diaphoretic.  HENT:     Head: Normocephalic. No right periorbital erythema or left periorbital erythema.     Jaw: No trismus or pain on movement.     Comments: Minimal facial swelling noted to right cheek, no angioedema    Mouth/Throat:     Mouth: Mucous membranes are moist.     Pharynx: Oropharynx is clear. Uvula midline. No pharyngeal swelling, oropharyngeal exudate, posterior oropharyngeal erythema or uvula swelling.     Tonsils: No tonsillar exudate or tonsillar abscesses. 1+ on the right. 1+ on the left.     Comments: No swelling to oral mucosa or angioedema.  Patient able to handle oral secretions without difficulty. Eyes:     General: No scleral icterus.       Right eye: No discharge.        Left eye: No discharge.  Cardiovascular:     Rate and Rhythm: Normal rate.   Pulmonary:     Effort: Pulmonary effort is normal. No bradypnea or respiratory distress.     Breath sounds: Normal breath sounds. No stridor.     Comments: Patient able to speak in full complete sentences without difficulty. Skin:    General: Skin is warm and dry.     Comments: Patient has multiple areas of swelling and redness to bilateral lower extremities, upper extremities, cervical neck, right side of her face.  No hives, petechiae, or purpura noted.  Neurological:     General: No focal deficit present.     Mental Status: She is alert.  Psychiatric:  Behavior: Behavior is cooperative.    ED Results / Procedures / Treatments   Labs (all labs ordered are listed, but only abnormal results are displayed) Labs Reviewed - No data to display  EKG None  Radiology No results found.  Procedures Procedures   Medications Ordered in ED Medications - No data to display  ED Course  I have reviewed the triage vital signs and the nursing notes.  Pertinent labs & imaging results that were available during my care of the patient were reviewed by me and considered in my medical decision making (see chart for details).    MDM Rules/Calculators/A&P                          Alert 35 year old female in no acute distress, nontoxic-appearing.  Patient presents to the emergency department after being stung by insects multiple times. Patient denies any known history of allergies to bee stings.  Patient denies any trouble swallowing, shortness of breath, angioedema, nausea, vomiting, diarrhea, hives.  No angioedema or swelling to oral mucosa.  Patient able to handle secretions without difficulty.  Patient speaks in full complete sentences without difficulty.  Lungs clear to auscultation bilaterally.  No hives noted.  Low suspicion for anaphylactic reaction at this time.  Ibuprofen and oral prednisone to help with her swelling and pain.  Patient given strict return precautions.  Patient  expressed understanding of all instructions and is agreeable with this plan.  Final Clinical Impression(s) / ED Diagnoses Final diagnoses:  Bee sting, accidental or unintentional, initial encounter    Rx / DC Orders ED Discharge Orders          Ordered    predniSONE (DELTASONE) 20 MG tablet  Daily        09/30/20 1223             Berneice Heinrich 09/30/20 2139    Lorre Nick, MD 10/01/20 1421

## 2020-10-03 ENCOUNTER — Ambulatory Visit (HOSPITAL_COMMUNITY)
Admission: EM | Admit: 2020-10-03 | Discharge: 2020-10-03 | Disposition: A | Discharge status: Home / Self Care | Payer: Medicaid Other | Attending: Emergency Medicine | Admitting: Emergency Medicine

## 2020-10-03 ENCOUNTER — Other Ambulatory Visit: Payer: Self-pay

## 2020-10-03 ENCOUNTER — Encounter (HOSPITAL_COMMUNITY): Payer: Self-pay

## 2020-10-03 DIAGNOSIS — T63441A Toxic effect of venom of bees, accidental (unintentional), initial encounter: Secondary | ICD-10-CM | POA: Diagnosis not present

## 2020-10-03 DIAGNOSIS — R0789 Other chest pain: Secondary | ICD-10-CM | POA: Diagnosis not present

## 2020-10-03 DIAGNOSIS — M545 Low back pain, unspecified: Secondary | ICD-10-CM | POA: Diagnosis not present

## 2020-10-03 MED ORDER — HYDROCORTISONE 1 % EX CREA: TOPICAL_CREAM | CUTANEOUS | 0 refills | Status: DC

## 2020-10-03 MED ORDER — PREDNISONE 20 MG PO TABS: 40.0000 mg | ORAL_TABLET | Freq: Every day | ORAL | 0 refills | Status: DC

## 2020-10-03 MED ORDER — MELOXICAM 7.5 MG PO TABS: 7.5000 mg | ORAL_TABLET | Freq: Every day | ORAL | 0 refills | Status: DC

## 2020-10-03 MED ORDER — CYCLOBENZAPRINE HCL 10 MG PO TABS: 10.0000 mg | ORAL_TABLET | Freq: Every day | ORAL | 0 refills | Status: DC

## 2020-10-03 NOTE — ED Provider Notes (Signed)
[facility]    CSN: [id number] Arrival date & time: 10/03/20  1731      History   Chief Complaint Chief Complaint  Patient presents with   Insect Bite    HPI Vanessa Morton is a 35 y.o. female.   Patient presents with lower back pain and chest soreness beginning three days ago after being attacked by a wasp nest. Patient attest to falling and rolling around ground while trying to get away. ROM intact but worsened with twisting and turning of back. Has scattered markings with swelling over bilateral legs, arms, scalp, abdomen, chest and back from stings. Seen in ED after attack, has been taking prescribed ibuprofen with no relief. Attempted warm soak but irritated markings where stung. Denies chest pain or tightness, shortness of breath, difficulty breathing or swallowing, itchy throat, wheezing. History of morbid obesity., Type 2 DM, controlled, no medication, HTN.    Past Medical History:  Diagnosis Date   Abscess    Anxiety    Chest pain    Gonorrhea    Hypertension    Morbid obesity (HCC)    Obesity    Urinary tract infection     Patient Active Problem List   Diagnosis Date Noted   Abscess of back 10/20/2018   Axillary abscess 10/20/2018   Morbid obesity (HCC) 10/20/2018   Rash/skin eruption 10/20/2018    Past Surgical History:  Procedure Laterality Date   CESAREAN SECTION     HYDRADENITIS EXCISION N/A 03/04/2019   Procedure: EXCISION HIDRADENITIS RIGHT AXILLA X2 AND BACK X2;  Surgeon: Griselda Miner, MD;  Location: [facility] OR;  Service: General;  Laterality: N/A;   TONSILLECTOMY      OB History   No obstetric history on file.      Home Medications    Prior to Admission medications   Medication Sig Start Date End Date Taking? Authorizing Provider  cyclobenzaprine (FLEXERIL) 10 MG tablet Take 1 tablet (10 mg total) by mouth at bedtime. 10/03/20  Yes Danicia Terhaar, Hansel Starling R, NP  hydrocortisone cream 1 % Apply to affected area 2 times daily 10/03/20  Yes  Remy Voiles R, NP  meloxicam (MOBIC) 7.5 MG tablet Take 1 tablet (7.5 mg total) by mouth daily. 10/03/20  Yes Hideo Googe R, NP  predniSONE (DELTASONE) 20 MG tablet Take 2 tablets (40 mg total) by mouth daily. 10/03/20  Yes Makaia Rappa R, NP  benzonatate (TESSALON) 100 MG capsule Take 1 capsule (100 mg total) by mouth every 8 (eight) hours. Patient not taking: Reported on 12/10/2019 07/31/19   Melene Plan, DO  Buprenorphine HCl-Naloxone HCl 8-2 MG FILM Take 1 Film by mouth 3 (three) times daily.  07/02/18   [provider]  diazepam (VALIUM) 10 MG tablet Take 10 mg by mouth every 6 (six) hours as needed for anxiety.    [provider]  furosemide (LASIX) 20 MG tablet Take 1 tablet (20 mg total) by mouth daily. 12/02/19   Coralyn Mark, NP  gabapentin (NEURONTIN) 100 MG capsule Take 100 mg by mouth 3 (three) times daily. Takes along with 300 mg capsule to make total 400 mg.    [provider]  gabapentin (NEURONTIN) 300 MG capsule Take 300 mg by mouth in the morning, at noon, and at bedtime. Takes along with 100 mg capsule to make 400 mg. 01/25/19   [provider]  hydrochlorothiazide (HYDRODIURIL) 25 MG tablet Take 25 mg by mouth every morning. 11/25/19   [provider]  HYDROcodone-acetaminophen (NORCO/VICODIN) 5-325 MG tablet Take 1-2 tablets by mouth every 6 (six) hours as needed for moderate pain or severe pain. Patient not taking: Reported on 07/31/2019 03/04/19   Chevis Pretty III, MD  ibuprofen (ADVIL) 200 MG tablet Take 200 mg by mouth every 6 (six) hours as needed for moderate pain.    [provider]  losartan (COZAAR) 50 MG tablet Take 50 mg by mouth daily. 10/29/19   [provider]  magnesium citrate SOLN Take 1 Bottle by mouth as needed for mild constipation.  12/02/19   [provider]  neomycin-polymyxin-hydrocortisone (CORTISPORIN) 3.5-10000-1 OTIC suspension Place 4 drops into the right ear 3 (three) times  daily. Patient not taking: Reported on 12/10/2019 10/20/19   Darr, Gerilyn Pilgrim, PA-C  NYSTATIN powder Apply 1 application topically 3 (three) times daily as needed (skin irritation).  09/28/19   [provider]  ondansetron (ZOFRAN ODT) 4 MG disintegrating tablet Take 1 tablet (4 mg total) by mouth every 8 (eight) hours as needed for nausea or vomiting. Patient not taking: Reported on 12/10/2019 07/31/19   Melene Plan, DO  phentermine (ADIPEX-P) 37.5 MG tablet Take 37.5 mg by mouth every morning.  06/25/19   [provider]  topiramate (TOPAMAX) 50 MG tablet Take 50 mg by mouth at bedtime as needed (headaches).     [provider]  zolpidem (AMBIEN) 10 MG tablet Take 10 mg by mouth at bedtime as needed for sleep.    [provider]    Family History Family History  Problem Relation Age of Onset   Hypertension Mother    Diabetes Mother    Diabetes Other    Hypertension Other     Social History Social History   Tobacco Use   Smoking status: Never   Smokeless tobacco: Never  Vaping Use   Vaping Use: Never used  Substance Use Topics   Alcohol use: No   Drug use: No     Allergies   Patient has no known allergies.   Review of Systems Review of Systems- defer to HPI     Physical Exam Triage Vital Signs ED Triage Vitals [10/03/20 1824]  Enc Vitals Group     BP 126/84     Pulse Rate 96     Resp 20     Temp 97.7 F (36.5 C)     Temp Source Oral     SpO2 98 %     Weight      Height      Head Circumference      Peak Flow      Pain Score 7     Pain Loc      Pain Edu?      Excl. in GC?    No data found.  Updated Vital Signs BP 126/84 (BP Location: Left Arm)   Pulse 96   Temp 97.7 F (36.5 C) (Oral)   Resp 20   LMP  (LMP Unknown)   SpO2 98%   Visual Acuity Right Eye Distance:   Left Eye Distance:   Bilateral Distance:    Right Eye Near:   Left Eye Near:    Bilateral Near:     Physical Exam Constitutional:      Appearance:  Normal appearance. She is obese.  HENT:     Head: Normocephalic.  Eyes:     Extraocular Movements: Extraocular movements intact.  Pulmonary:     Effort: Pulmonary effort is normal.  Chest:  Comments: Reproducible tenderness on palpation across chest wall  Musculoskeletal:     Cervical back: Normal.     Thoracic back: Normal.     Lumbar back: Tenderness present. No swelling, spasms or bony tenderness. Decreased range of motion.       Back:  Skin:    Comments: Scattered hives across bilateral arms, bilateral legs, abdomen, chest, back, and scalp   Neurological:     General: No focal deficit present.     Mental Status: She is alert and oriented to person, place, and time. Mental status is at baseline.  Psychiatric:        Mood and Affect: Mood normal.        Behavior: Behavior normal.     UC Treatments / Results  Labs (all labs ordered are listed, but only abnormal results are displayed) Labs Reviewed - No data to display  EKG   Radiology No results found.  Procedures Procedures (including critical care time)  Medications Ordered in UC Medications - No data to display  Initial Impression / Assessment and Plan / UC Course  I have reviewed the triage vital signs and the nursing notes.  Pertinent labs & imaging results that were available during my care of the patient were reviewed by me and considered in my medical decision making (see chart for details).  Bee sting Acute bilateral low back pain without sciatica Acute chest wall pain  Prednisone 40 mg daily for 5 days, at completion begin meloxicam 7.5 mg daily for 5 days then as needed Flexeril 10 mg at bedtime prn Hydrocortisone 1% cream bid to affected area Gentle stretching as tolerated, activities as tolerated Heating pad 15 minute intervals  Final Clinical Impressions(s) / UC Diagnoses   Final diagnoses:  Bee sting, accidental or unintentional, initial encounter  Acute bilateral low back pain  without sciatica  Acute chest wall pain     Discharge Instructions      Starting tomorrow take prednsione each morning with food, at completion take meloxicam every morning with food with 5 days then as needed  Can use muscle relaxer for additional comfort at bedtime, be mindful this may make you drowsy  Can apply hydrocortisone cream to larger area to help with swelling and discomfort  Can use heating pad on chest and aback in 15 minute intervals for comfrot  Actitivites as tolerated   May continue warm soaks as tolerated    ED Prescriptions     Medication Sig Dispense Auth. Provider   cyclobenzaprine (FLEXERIL) 10 MG tablet Take 1 tablet (10 mg total) by mouth at bedtime. 10 tablet Klee Kolek R, NP   predniSONE (DELTASONE) 20 MG tablet Take 2 tablets (40 mg total) by mouth daily. 10 tablet Brockton Mckesson, Hansel Starling R, NP   meloxicam (MOBIC) 7.5 MG tablet Take 1 tablet (7.5 mg total) by mouth daily. 30 tablet Deatrice Spanbauer R, NP   hydrocortisone cream 1 % Apply to affected area 2 times daily 15 g Janie Capp, Elita Boone, NP      PDMP not reviewed this encounter.   Valinda Hoar, NP 10/03/20 1951

## 2020-10-03 NOTE — ED Triage Notes (Signed)
Pt states she was "attacked by wasp multiple times". Pt states she fell to ground trying to get rid of them now she is having generalized body pain.  Started: Sunday Interventions: motrin, tylenol- not helpful

## 2020-10-03 NOTE — Discharge Instructions (Addendum)
Starting tomorrow take prednsione each morning with food, at completion take meloxicam every morning with food with 5 days then as needed  Can use muscle relaxer for additional comfort at bedtime, be mindful this may make you drowsy  Can apply hydrocortisone cream to larger area to help with swelling and discomfort  Can use heating pad on chest and aback in 15 minute intervals for comfrot  Actitivites as tolerated   May continue warm soaks as tolerated

## 2020-10-14 ENCOUNTER — Other Ambulatory Visit: Payer: Self-pay

## 2020-10-14 ENCOUNTER — Emergency Department (HOSPITAL_COMMUNITY)
Admission: EM | Admit: 2020-10-14 | Discharge: 2020-10-14 | Disposition: A | Discharge status: Home / Self Care | Payer: Medicaid Other | Attending: Emergency Medicine | Admitting: Emergency Medicine

## 2020-10-14 ENCOUNTER — Encounter (HOSPITAL_COMMUNITY): Payer: Self-pay | Admitting: Emergency Medicine

## 2020-10-14 DIAGNOSIS — L509 Urticaria, unspecified: Secondary | ICD-10-CM | POA: Insufficient documentation

## 2020-10-14 DIAGNOSIS — T7840XA Allergy, unspecified, initial encounter: Secondary | ICD-10-CM

## 2020-10-14 DIAGNOSIS — Z79899 Other long term (current) drug therapy: Secondary | ICD-10-CM | POA: Diagnosis not present

## 2020-10-14 DIAGNOSIS — R220 Localized swelling, mass and lump, head: Secondary | ICD-10-CM | POA: Diagnosis present

## 2020-10-14 DIAGNOSIS — I10 Essential (primary) hypertension: Secondary | ICD-10-CM | POA: Insufficient documentation

## 2020-10-14 LAB — I-STAT BETA HCG BLOOD, ED (MC, WL, AP ONLY): I-stat hCG, quantitative: <5 m[IU]/mL (ref <5)

## 2020-10-14 MED ORDER — PREDNISONE 10 MG PO TABS: 40.0000 mg | ORAL_TABLET | Freq: Every day | ORAL | 0 refills | Status: AC

## 2020-10-14 MED ORDER — DIPHENHYDRAMINE HCL 50 MG/ML IJ SOLN
25.0000 mg | Freq: Once | INTRAMUSCULAR | Status: AC
  Administered 2020-10-14: 25 mg via INTRAVENOUS
  Filled 2020-10-14: qty 1

## 2020-10-14 MED ORDER — DEXAMETHASONE SODIUM PHOSPHATE 10 MG/ML IJ SOLN
10.0000 mg | Freq: Once | INTRAMUSCULAR | Status: AC
  Administered 2020-10-14: 10 mg via INTRAVENOUS
  Filled 2020-10-14: qty 1

## 2020-10-14 MED ORDER — DIPHENHYDRAMINE HCL 25 MG PO TABS: 50.0000 mg | ORAL_TABLET | Freq: Four times a day (QID) | ORAL | 0 refills | Status: DC | PRN

## 2020-10-14 MED ORDER — FAMOTIDINE 20 MG PO TABS: 20.0000 mg | ORAL_TABLET | Freq: Two times a day (BID) | ORAL | 0 refills | Status: DC

## 2020-10-14 MED ORDER — FAMOTIDINE IN NACL 20-0.9 MG/50ML-% IV SOLN
20.0000 mg | Freq: Once | INTRAVENOUS | Status: AC
  Administered 2020-10-14: 20 mg via INTRAVENOUS
  Filled 2020-10-14: qty 50

## 2020-10-14 NOTE — ED Triage Notes (Addendum)
Pt reports angioedema that started today when she woke up from a nap. States she noticed a little swelling to L side of face before the nap.  Takes Lisinopril.  Also reports taking Amoxicillin x 2 days for dental pain.  Has not taken BP medication today.  Reports sore throat and mild SOB.

## 2020-10-14 NOTE — ED Provider Notes (Signed)
[facility] EMERGENCY DEPARTMENT Provider Note   CSN: [id number] Arrival date & time: 10/14/20  1727     History Chief Complaint  Patient presents with   Angioedema    Vanessa Morton is a 35 y.o. female presenting for evaluation of facial swelling and abdominal itching.  Patient states she had some mild facial swelling yesterday, but when she woke up from a nap at about 4:00 this afternoon, she noticed significant swelling of her mouth, face, hands.  She is also having itching of her abdomen.  She denies difficulty swallowing, chest pain, shortness of breath, cough, nausea vomiting, Donnell pain, urinary symptoms, normal bowel movements.  She started on amoxicillin for dental infection 2 days ago.  No other new medications in the past couple days.  She was recently being treated for bee stings, finish steroids and H1/H2 blockers a few days ago.  Patient is on losartan and HCTZ for blood pressure, no ACE inhibitor's. No new detergents, soaps, clothes, food, environments  HPI     Past Medical History:  Diagnosis Date   Abscess    Anxiety    Chest pain    Gonorrhea    Hypertension    Morbid obesity (HCC)    Obesity    Urinary tract infection     Patient Active Problem List   Diagnosis Date Noted   Abscess of back 10/20/2018   Axillary abscess 10/20/2018   Morbid obesity (HCC) 10/20/2018   Rash/skin eruption 10/20/2018    Past Surgical History:  Procedure Laterality Date   CESAREAN SECTION     HYDRADENITIS EXCISION N/A 03/04/2019   Procedure: EXCISION HIDRADENITIS RIGHT AXILLA X2 AND BACK X2;  Surgeon: Griselda Miner, MD;  Location: [facility] OR;  Service: General;  Laterality: N/A;   TONSILLECTOMY       OB History   No obstetric history on file.     Family History  Problem Relation Age of Onset   Hypertension Mother    Diabetes Mother    Diabetes Other    Hypertension Other     Social History   Tobacco Use   Smoking status: Never    Smokeless tobacco: Never  Vaping Use   Vaping Use: Never used  Substance Use Topics   Alcohol use: No   Drug use: No    Home Medications Prior to Admission medications   Medication Sig Start Date End Date Taking? Authorizing Provider  amoxicillin (AMOXIL) 500 MG tablet Take 500 mg by mouth 4 (four) times daily. 10/10/20  Yes [provider]  Buprenorphine HCl-Naloxone HCl 8-2 MG FILM Take 1 Film by mouth 3 (three) times daily.  07/02/18  Yes [provider]  cyclobenzaprine (FLEXERIL) 10 MG tablet Take 1 tablet (10 mg total) by mouth at bedtime. Patient taking differently: Take 10 mg by mouth daily as needed for muscle spasms. 10/03/20  Yes White, Adrienne R, NP  diphenhydrAMINE (BENADRYL) 25 MG tablet Take 2 tablets (50 mg total) by mouth every 6 (six) hours as needed. 10/14/20  Yes Marvens Hollars, PA-C  famotidine (PEPCID) 20 MG tablet Take 1 tablet (20 mg total) by mouth 2 (two) times daily. 10/14/20  Yes Jostin Rue, PA-C  FEROSUL 325 (65 Fe) MG tablet Take 325 mg by mouth daily. 09/07/20  Yes [provider]  gabapentin (NEURONTIN) 400 MG capsule Take 400 mg by mouth 3 (three) times daily. 09/29/20  Yes [provider]  hydrochlorothiazide (HYDRODIURIL) 25 MG tablet Take 25 mg by mouth  every morning. 11/25/19  Yes [provider]  ibuprofen (ADVIL) 800 MG tablet Take 800 mg by mouth 3 (three) times daily. 10/10/20  Yes [provider]  losartan (COZAAR) 50 MG tablet Take 50 mg by mouth daily. 10/29/19  Yes [provider]  meloxicam (MOBIC) 7.5 MG tablet Take 1 tablet (7.5 mg total) by mouth daily. Patient taking differently: Take 7.5 mg by mouth daily as needed for pain. 10/03/20  Yes White, Adrienne R, NP  predniSONE (DELTASONE) 10 MG tablet Take 4 tablets (40 mg total) by mouth daily for 5 days. 10/14/20 10/19/20 Yes Levi Klaiber, PA-C  zolpidem (AMBIEN) 10 MG tablet Take 10 mg by mouth at bedtime.   Yes [provider]  benzonatate (TESSALON) 100 MG capsule Take 1 capsule (100 mg total) by mouth every 8 (eight) hours. Patient not taking: No sig reported 07/31/19   Melene Plan, DO  furosemide (LASIX) 20 MG tablet Take 1 tablet (20 mg total) by mouth daily. Patient not taking: Reported on 10/14/2020 12/02/19   Coralyn Mark, NP  HYDROcodone-acetaminophen (NORCO/VICODIN) 5-325 MG tablet Take 1-2 tablets by mouth every 6 (six) hours as needed for moderate pain or severe pain. Patient not taking: No sig reported 03/04/19   Chevis Pretty III, MD  hydrocortisone cream 1 % Apply to affected area 2 times daily Patient not taking: No sig reported 10/03/20   Valinda Hoar, NP  neomycin-polymyxin-hydrocortisone (CORTISPORIN) 3.5-10000-1 OTIC suspension Place 4 drops into the right ear 3 (three) times daily. Patient not taking: No sig reported 10/20/19   Darr, Gerilyn Pilgrim, PA-C  ondansetron (ZOFRAN ODT) 4 MG disintegrating tablet Take 1 tablet (4 mg total) by mouth every 8 (eight) hours as needed for nausea or vomiting. Patient not taking: No sig reported 07/31/19   Melene Plan, DO  phentermine (ADIPEX-P) 37.5 MG tablet Take 37.5 mg by mouth every morning.  Patient not taking: Reported on 10/14/2020 06/25/19   [provider]    Allergies    Amoxicillin  Review of Systems   Review of Systems  HENT:  Positive for facial swelling.   Skin:  Positive for rash.  All other systems reviewed and are negative.  Physical Exam Updated Vital Signs BP 110/67   Pulse 97   Temp 99 F (37.2 C) (Oral)   Resp 13   LMP 10/13/2020   SpO2 98%   Physical Exam Vitals and nursing note reviewed.  Constitutional:      General: She is not in acute distress.    Appearance: Normal appearance. She is obese.     Comments: nontoxic  HENT:     Head: Normocephalic and atraumatic.     Comments: Mild periorbital swelling noted.  Swelling of both upper and lower lips.  No swelling of the oropharynx.  Handling secretions  easily.  No swelling of the tongue.  No muffled voice. Eyes:     Conjunctiva/sclera: Conjunctivae normal.     Pupils: Pupils are equal, round, and reactive to light.  Cardiovascular:     Rate and Rhythm: Normal rate and regular rhythm.     Pulses: Normal pulses.  Pulmonary:     Effort: Pulmonary effort is normal. No respiratory distress.     Breath sounds: Normal breath sounds. No wheezing.     Comments: Speaking in full sentences.  Clear lung sounds in all fields. Abdominal:     General: There is no distension.     Palpations: Abdomen is soft. There is no mass.  Tenderness: There is no abdominal tenderness. There is no guarding or rebound.  Musculoskeletal:        General: Normal range of motion.     Cervical back: Normal range of motion and neck supple.  Skin:    General: Skin is warm and dry.     Capillary Refill: Capillary refill takes less than 2 seconds.     Findings: Rash present.     Comments: Mild urticaria noted along the lower abdomen  Neurological:     Mental Status: She is alert and oriented to person, place, and time.  Psychiatric:        Mood and Affect: Mood and affect normal.        Speech: Speech normal.        Behavior: Behavior normal.    ED Results / Procedures / Treatments   Labs (all labs ordered are listed, but only abnormal results are displayed) Labs Reviewed  I-STAT BETA HCG BLOOD, ED ([facility], [facility], [facility] ONLY)    EKG None  Radiology No results found.  Procedures Procedures   Medications Ordered in ED Medications  diphenhydrAMINE (BENADRYL) injection 25 mg (has no administration in time range)  diphenhydrAMINE (BENADRYL) injection 25 mg (25 mg Intravenous Given 10/14/20 1921)  dexamethasone (DECADRON) injection 10 mg (10 mg Intravenous Given 10/14/20 1921)  famotidine (PEPCID) IVPB 20 mg premix (0 mg Intravenous Stopped 10/14/20 2014)    ED Course  I have reviewed the triage vital signs and the nursing notes.  Pertinent labs & imaging  results that were available during my care of the patient were reviewed by me and considered in my medical decision making (see chart for details).    MDM Rules/Calculators/A&P                           Patient presenting for evaluation of facial swelling and itching.  On exam, patient does have periorbital swelling and swelling of the lips and hands.  Urticarial rash noted of the abdomen.  However airway is intact, handling secretions easily.  No signs of respiratory distress.  As such, will treat for allergic reaction but hold off on epinephrine.  On reevaluation after medications, patient reports a mild improvement of symptoms.  We will continue to monitor.  Patient remains without worsening symptoms.  She continues to have itching.  She is tolerating p.o. without difficulty.  Discussed with patient continue treatment with prednisone, H1, and H2 blockers.  Encourage close follow-up with allergy center.  At this time, patient appears safe for discharge.  Return precautions given.  Patient states she understands and agrees to plan.   Final Clinical Impression(s) / ED Diagnoses Final diagnoses:  Allergic reaction, initial encounter    Rx / DC Orders ED Discharge Orders          Ordered    predniSONE (DELTASONE) 10 MG tablet  Daily        10/14/20 2213    famotidine (PEPCID) 20 MG tablet  2 times daily        10/14/20 2213    diphenhydrAMINE (BENADRYL) 25 MG tablet  Every 6 hours PRN        10/14/20 2213             Alveria Apley, PA-C 10/14/20 2214    Vanetta Mulders, MD 10/19/20 1243

## 2020-10-14 NOTE — ED Notes (Signed)
E-signature pad unavailable at time of pt discharge. This RN discussed discharge materials with pt and answered all pt questions. Pt stated understanding of discharge material. ? ?

## 2020-10-14 NOTE — Discharge Instructions (Addendum)
Stop your antibiotic, this may be causing your reaction. Take prednisone as prescribed.   Take Pepcid twice a day for next week. Use Benadryl, 25 to 50 mg every 4-6 hours as needed for itching. Make sure you are staying well-hydrated water. Call the allergy center listed below to set up an appointment for further evaluation. Return to the emergency room with any new, worsening, or concerning symptoms

## 2020-11-08 ENCOUNTER — Encounter (HOSPITAL_COMMUNITY): Payer: Self-pay

## 2020-11-08 ENCOUNTER — Emergency Department (HOSPITAL_COMMUNITY)
Admission: EM | Admit: 2020-11-08 | Discharge: 2020-11-08 | Disposition: A | Discharge status: Home / Self Care | Payer: Medicaid Other | Attending: Emergency Medicine | Admitting: Emergency Medicine

## 2020-11-08 DIAGNOSIS — K029 Dental caries, unspecified: Secondary | ICD-10-CM | POA: Insufficient documentation

## 2020-11-08 DIAGNOSIS — K120 Recurrent oral aphthae: Secondary | ICD-10-CM | POA: Diagnosis not present

## 2020-11-08 DIAGNOSIS — K0889 Other specified disorders of teeth and supporting structures: Secondary | ICD-10-CM | POA: Diagnosis present

## 2020-11-08 DIAGNOSIS — Z79899 Other long term (current) drug therapy: Secondary | ICD-10-CM | POA: Insufficient documentation

## 2020-11-08 DIAGNOSIS — I10 Essential (primary) hypertension: Secondary | ICD-10-CM | POA: Diagnosis not present

## 2020-11-08 MED ORDER — MELOXICAM 7.5 MG PO TABS: 7.5000 mg | ORAL_TABLET | Freq: Every day | ORAL | Status: DC | PRN

## 2020-11-08 MED ORDER — NYSTATIN 100000 UNIT/ML MT SUSP: 10.0000 mL | Freq: Three times a day (TID) | OROMUCOSAL | 0 refills | Status: AC

## 2020-11-08 MED ORDER — CYCLOBENZAPRINE HCL 10 MG PO TABS: 10.0000 mg | ORAL_TABLET | Freq: Every day | ORAL | Status: DC | PRN

## 2020-11-08 NOTE — Discharge Instructions (Addendum)
Please follow-up with your oral surgeon regarding tooth extraction given your multiple dental caries and poor dentition.  You have an ulcerative lesion in the posterior aspect of your right mouth.  Recommend Magic mouthwash for the next two weeks which should help with pain control as the ulcer heals. If you develop difficulty opening your mouth, worsening pain and swelling in the right side of your mouth, fever or chills, please return to the emergency department for consideration for antibiotics.

## 2020-11-08 NOTE — ED Provider Notes (Signed)
[facility]EMERGENCY DEPT Provider Note   CSN: [id number] Arrival date & time: 11/08/20  0841     History Chief Complaint  Patient presents with   Dental Pain    Vanessa Morton is a 35 y.o. female.   Dental Pain Associated symptoms: oral lesions   Associated symptoms: no drooling, no facial swelling and no fever    35 year old female with a history of hypertension, morbid obesity, poor dentition and dental caries with planned surgical consult for tooth extraction in the next month presenting to the emergency department with oral pain.  The patient states that she has had increasing oral pain in the right upper part of her mouth, worse with opening her mouth and eating foods.  She denies any trismus.  Past Medical History:  Diagnosis Date   Abscess    Anxiety    Chest pain    Gonorrhea    Hypertension    Morbid obesity (HCC)    Obesity    Urinary tract infection     Patient Active Problem List   Diagnosis Date Noted   Abscess of back 10/20/2018   Axillary abscess 10/20/2018   Morbid obesity (HCC) 10/20/2018   Rash/skin eruption 10/20/2018    Past Surgical History:  Procedure Laterality Date   CESAREAN SECTION     HYDRADENITIS EXCISION N/A 03/04/2019   Procedure: EXCISION HIDRADENITIS RIGHT AXILLA X2 AND BACK X2;  Surgeon: Griselda Miner, MD;  Location: [facility] OR;  Service: General;  Laterality: N/A;   TONSILLECTOMY       OB History   No obstetric history on file.     Family History  Problem Relation Age of Onset   Hypertension Mother    Diabetes Mother    Diabetes Other    Hypertension Other     Social History   Tobacco Use   Smoking status: Never   Smokeless tobacco: Never  Vaping Use   Vaping Use: Never used  Substance Use Topics   Alcohol use: No   Drug use: No    Home Medications Prior to Admission medications   Medication Sig Start Date End Date Taking? Authorizing Provider  magic mouthwash (nystatin, lidocaine,  diphenhydrAMINE, alum & mag hydroxide) suspension Swish and spit 10 mLs 3 (three) times daily for 14 days. 11/08/20 11/22/20 Yes Ernie Avena, MD  Buprenorphine HCl-Naloxone HCl 8-2 MG FILM Take 1 Film by mouth 3 (three) times daily.  07/02/18   [provider]  cyclobenzaprine (FLEXERIL) 10 MG tablet Take 1 tablet (10 mg total) by mouth daily as needed for muscle spasms. 11/08/20   Ernie Avena, MD  FEROSUL 325 (65 Fe) MG tablet Take 325 mg by mouth daily. 09/07/20   [provider]  gabapentin (NEURONTIN) 400 MG capsule Take 400 mg by mouth 3 (three) times daily. 09/29/20   [provider]  hydrochlorothiazide (HYDRODIURIL) 25 MG tablet Take 25 mg by mouth every morning. 11/25/19   [provider]  ibuprofen (ADVIL) 800 MG tablet Take 800 mg by mouth 3 (three) times daily. 10/10/20   [provider]  losartan (COZAAR) 50 MG tablet Take 50 mg by mouth daily. 10/29/19   [provider]  meloxicam (MOBIC) 7.5 MG tablet Take 1 tablet (7.5 mg total) by mouth daily as needed for pain. 11/08/20   Ernie Avena, MD  zolpidem (AMBIEN) 10 MG tablet Take 10 mg by mouth at bedtime.    [provider]    Allergies    Amoxicillin  and Clindamycin  Review of Systems   Review of Systems  Constitutional:  Negative for chills and fever.  HENT:  Positive for dental problem and mouth sores. Negative for drooling, ear pain, facial swelling, sore throat, trouble swallowing and voice change.   Eyes:  Negative for pain and visual disturbance.  Respiratory:  Negative for cough and shortness of breath.   Cardiovascular:  Negative for chest pain and palpitations.  Gastrointestinal:  Negative for abdominal pain and vomiting.  Genitourinary:  Negative for dysuria and hematuria.  Musculoskeletal:  Negative for arthralgias and back pain.  Skin:  Negative for color change and rash.  Neurological:  Negative for seizures and syncope.  All other systems reviewed and  are negative.  Physical Exam Updated Vital Signs BP (!) 141/98   Pulse 71   Temp 97.9 F (36.6 C) (Oral)   Resp 18   LMP 10/13/2020   SpO2 100%   Physical Exam Vitals and nursing note reviewed.  Constitutional:      General: She is not in acute distress.    Appearance: She is well-developed.  HENT:     Head: Normocephalic and atraumatic.     Mouth/Throat:     Comments: Ulcerative lesion in the posterior oropharynx, localized, shallow, oval red/white ulceration.  No surrounding fluctuance or erythema Eyes:     Conjunctiva/sclera: Conjunctivae normal.  Cardiovascular:     Rate and Rhythm: Normal rate and regular rhythm.     Heart sounds: No murmur heard. Pulmonary:     Effort: Pulmonary effort is normal. No respiratory distress.     Breath sounds: Normal breath sounds.  Abdominal:     Palpations: Abdomen is soft.     Tenderness: There is no abdominal tenderness.  Musculoskeletal:     Cervical back: Neck supple.  Skin:    General: Skin is warm and dry.  Neurological:     Mental Status: She is alert.      ED Results / Procedures / Treatments   Labs (all labs ordered are listed, but only abnormal results are displayed) Labs Reviewed - No data to display  EKG None  Radiology No results found.  Procedures Procedures   Medications Ordered in ED Medications - No data to display  ED Course  I have reviewed the triage vital signs and the nursing notes.  Pertinent labs & imaging results that were available during my care of the patient were reviewed by me and considered in my medical decision making (see chart for details).    MDM Rules/Calculators/A&P                           35 year old female with a history of hypertension, morbid obesity, poor dentition and dental caries with planned surgical consult for tooth extraction in the next month presenting to the emergency department with oral pain.  The patient states that she has had increasing oral pain in  the right upper part of her mouth, worse with opening her mouth and eating foods.  She denies any trismus.   Physical exam was concerning for an oral ulcerative lesion in the posterior oropharynx.  No palpable fluctuance concerning for odontogenic abscess.  No trismus.  Low concern for Ludwig's, PTA, other soft tissue infection of the oropharynx.  The patient has poor dentition at baseline and is scheduled for consultation with an oral surgeon for tooth extraction.  Symptoms are most concerning for an oral ulceration of the posterior oropharynx of  unclear etiology.  The patient was prescribed Magic mouthwash and advised to follow-up with her oral surgeon and dentist for routine oral care.  Strict return precautions provided in the event of worsening symptoms to include worsening swelling, pain, development of fever or chills indicative of an infection requiring antibiotics.  Of note, the patient has had a recent history of allergic reaction to both penicillins and clindamycin.  Final Clinical Impression(s) / ED Diagnoses Final diagnoses:  Aphthous ulcer  Dental caries    Rx / DC Orders ED Discharge Orders          Ordered    cyclobenzaprine (FLEXERIL) 10 MG tablet  Daily PRN        11/08/20 1016    meloxicam (MOBIC) 7.5 MG tablet  Daily PRN        11/08/20 1017    magic mouthwash (nystatin, lidocaine, diphenhydrAMINE, alum & mag hydroxide) suspension  3 times daily        11/08/20 1018             Ernie Avena, MD 11/08/20 1528

## 2020-11-08 NOTE — ED Triage Notes (Signed)
Pt arrived via POV, c/o dental pain. States she has dental surgical consult in a few months but pain worsening.

## 2020-11-16 ENCOUNTER — Encounter: Payer: Self-pay | Admitting: Internal Medicine

## 2020-11-16 ENCOUNTER — Other Ambulatory Visit: Payer: Self-pay

## 2020-11-16 ENCOUNTER — Ambulatory Visit (INDEPENDENT_AMBULATORY_CARE_PROVIDER_SITE_OTHER): Payer: Medicaid Other | Admitting: Internal Medicine

## 2020-11-16 VITALS — BP 146/92 | HR 75 | Temp 98.2°F | Resp 19 | Ht 63.75 in | Wt 287.2 lb

## 2020-11-16 DIAGNOSIS — Z889 Allergy status to unspecified drugs, medicaments and biological substances status: Secondary | ICD-10-CM | POA: Insufficient documentation

## 2020-11-16 NOTE — Progress Notes (Signed)
NEW PATIENT Date of Service/Encounter:  11/16/20 Referring provider: Diamantina Providence, FNP [facility] care provider: [facility]  Subjective:  Vanessa Morton is a 35 y.o. female with a PMHx of obesity and history of hydradenitis presenting today for evaluation of medication allergies. History obtained from: chart review and patient.   Recently, she was prescribed Amoxicillin for an oral infection on 10/14/20.  She took 3 days of amoxicillin.  15 hours after her last dose, she woke up with facial, lip and tongue swelling along with itching and hives on her abdomen.  Her throat felt itchy and scratchy.  She was treated with prednisone, benadryl and famotidine.  She didn't "feel right" for a few days-still itching, not breathing well.  She thinks she had a penicillin allergy as a young child, but doesn't remember any details.  Clindamycin was started days following the Amoxicillin.  She felt that many hours after her first or second dose, she started having throat itching.  She was itching, but no hives.  Symptoms lasted about 2 days.  She discontinued the medication. No nausea, vomiting, or diarrhea. No coughing, but was clearing her throat.  She was later given doxycycline and did fine with this.    A week and a half before this, she had been bit by several wasps and had generalized hives.  Has just finished a course of prednisone the day before starting her amoxicillin.  Chart Review:  ED visit 10/14/2020: - Seen for facial swelling of mouth face and hands as well as abdominal itching, no other systemic symptoms, taking amoxicillin for dental infection, recently treated for bee stings with antihistamines and steroids, urticaria and swelling noted on exam, treated with Decadron, Pepcid, Benadryl  ED visits on 07/16 and 07/19 for bee stings-no concern for anaphylaxis, did have delayed generalized hives occurring several days after initial stings treated with prednisone  and antihistamines  Past Medical History: Past Medical History:  Diagnosis Date   Abscess    Anxiety    Chest pain    Gonorrhea    Hypertension    Morbid obesity (HCC)    Obesity    Urinary tract infection    Medication List:  Current Outpatient Medications  Medication Sig Dispense Refill   Buprenorphine HCl-Naloxone HCl 8-2 MG FILM Take 1 Film by mouth 3 (three) times daily.      cyclobenzaprine (FLEXERIL) 10 MG tablet Take 1 tablet (10 mg total) by mouth daily as needed for muscle spasms.     EPINEPHrine 0.3 mg/0.3 mL IJ SOAJ injection SMARTSIG:1 Pre-Filled Pen Syringe IM Daily     FEROSUL 325 (65 Fe) MG tablet Take 325 mg by mouth daily.     gabapentin (NEURONTIN) 400 MG capsule Take 400 mg by mouth 3 (three) times daily.     hydrochlorothiazide (HYDRODIURIL) 25 MG tablet Take 25 mg by mouth every morning.     ibuprofen (ADVIL) 800 MG tablet Take 800 mg by mouth 3 (three) times daily.     losartan (COZAAR) 50 MG tablet Take 50 mg by mouth daily.     magic mouthwash (nystatin, lidocaine, diphenhydrAMINE, alum & mag hydroxide) suspension Swish and spit 10 mLs 3 (three) times daily for 14 days. 420 mL 0   meloxicam (MOBIC) 7.5 MG tablet Take 1 tablet (7.5 mg total) by mouth daily as needed for pain.     zolpidem (AMBIEN) 10 MG tablet Take 10 mg by mouth at bedtime.     busPIRone (BUSPAR)  10 MG tablet Take 10 mg by mouth 2 (two) times daily.     No current facility-administered medications for this visit.   Known Allergies:  Allergies  Allergen Reactions   Amoxicillin Shortness Of Breath and Swelling   Clindamycin Anaphylaxis   Past Surgical History: Past Surgical History:  Procedure Laterality Date   CESAREAN SECTION     HYDRADENITIS EXCISION N/A 03/04/2019   Procedure: EXCISION HIDRADENITIS RIGHT AXILLA X2 AND BACK X2;  Surgeon: Vanessa Miner, MD;  Location: [facility] OR;  Service: General;  Laterality: N/A;   TONSILLECTOMY     Family History: Family History  Problem  Relation Age of Onset   Hypertension Mother    Diabetes Mother    Diabetes Other    Hypertension Other    Social History: Vanessa Morton lives in an apartment, electric heating, central AC, window units, pet cat in the home, cat and dog outside the home, no visible roaches in the home, no dust mite protection, secondhand smoke exposure, she works as a Conservation officer, nature.   ROS:  All other systems negative except as noted per HPI.  Objective:  Blood pressure (!) 146/92, pulse 75, temperature 98.2 F (36.8 C), temperature source Temporal, resp. rate 19, height 5' 3.75" (1.619 m), weight 287 lb 3.2 oz (130.3 kg), SpO2 100 %. Body mass index is 49.69 kg/m. Physical Exam:  General Appearance:  Alert, cooperative, no distress, appears stated age  Head:  Normocephalic, without obvious abnormality, atraumatic  Eyes:  Conjunctiva clear, EOM's intact  Nose: Nares normal, mucosa normal  Throat: Lips, tongue normal; teeth and gums normal, normal posterior oropharynx  Neck: Supple, symmetrical  Lungs:   CTAB, no wheezing, Respirations unlabored, no coughing  Heart:  RRR, no murmur, Appears well perfused  Extremities: No edema  Skin: Skin color, texture, turgor normal, no rashes or lesions on visualized portions of skin  Neurologic: No gross deficits   Assessment:  Drug allergy Based on history unclear if her original reaction with penicillin was a true drug allergy versus recurrent swelling and hives from her previous bee sting.  The delayed nature of her symptoms following her last dose is suspicious for an alternative explanation and not true drug allergy.  Have advised penicillin testing followed by oral challenge to determine allergic status, patient is not interested in doing this today.  Her reaction with clindamycin is less concerning, and have offered oral challenge of this in the future if interested.  Would prioritize penicillin allergy first.  Until these are addressed, advised ongoing strict  avoidance.  She does have an upcoming dental procedure.  Will need to avoid penicillin and penicillin derivatives including first and second generation cephalosporins as well as clindamycin.  Her reaction to bee stings does not warrant further testing or allergy shots.  She does carry an EpiPen because of this reaction, and have instructed her on when it would be appropriate to use this Plan/Recommendations:   Patient Instructions  Drug Reaction (amoxicillin and clindamycin) - unclear if true drug allergy or if swelling associated with your recent dental infection - discussed need to undergo penicillin testing (used to help determine amoxicillin allergy)- skin testing followed by intradermal testing if indicated then followed by oral challenge to amoxicillin - if you decide you would like to do this, call us back to schedule a PENICILLIN CHALLENGE - for clindamycin, could consider an oral challenge, but would prioritize penicillin allergy first - in the meantime, please avoid both of these medications  Bee Sting Reaction -  if you have hives/itchy rash AND any other symptom (trouble breathing, swelling, vomiting, diarrhea, fainting, dizziness, nausea, etc) please use your Epipen - if only having hives, can take Benadryl 50 mg every 4 hours until hives resolve   This note in its entirety was forwarded to the Provider who requested this consultation.  Thank you for your kind referral. I appreciate the opportunity to take part in Vanessa Morton's care. Please do not hesitate to contact me with questions.  Sincerely,  Tonny Bollman, MD [facility]

## 2020-11-16 NOTE — Patient Instructions (Addendum)
Drug Reaction (amoxicillin and clindamycin) - unclear if true drug allergy or if swelling associated with your recent dental infection - discussed need to undergo penicillin testing (used to help determine amoxicillin allergy)- skin testing followed by intradermal testing if indicated then followed by oral challenge to amoxicillin - if you decide you would like to do this, call us back to schedule a PENICILLIN CHALLENGE - for clindamycin, could consider an oral challenge, but would prioritize penicillin allergy first - in the meantime, please avoid both of these medications  Bee Sting Reaction - if you have hives/itchy rash AND any other symptom (trouble breathing, swelling, vomiting, diarrhea, fainting, dizziness, nausea, etc) please use your Epipen - if only having hives, can take Benadryl 50 mg every 4 hours until hives resolve

## 2020-11-30 ENCOUNTER — Emergency Department (HOSPITAL_COMMUNITY)
Admission: EM | Admit: 2020-11-30 | Discharge: 2020-11-30 | Disposition: A | Discharge status: Home / Self Care | Payer: Medicaid Other | Attending: Emergency Medicine | Admitting: Emergency Medicine

## 2020-11-30 ENCOUNTER — Emergency Department (HOSPITAL_COMMUNITY): Payer: Medicaid Other

## 2020-11-30 ENCOUNTER — Other Ambulatory Visit: Payer: Self-pay

## 2020-11-30 DIAGNOSIS — I10 Essential (primary) hypertension: Secondary | ICD-10-CM | POA: Diagnosis not present

## 2020-11-30 DIAGNOSIS — K2900 Acute gastritis without bleeding: Secondary | ICD-10-CM | POA: Insufficient documentation

## 2020-11-30 DIAGNOSIS — Z79899 Other long term (current) drug therapy: Secondary | ICD-10-CM | POA: Insufficient documentation

## 2020-11-30 DIAGNOSIS — R1010 Upper abdominal pain, unspecified: Secondary | ICD-10-CM | POA: Diagnosis present

## 2020-11-30 DIAGNOSIS — R1011 Right upper quadrant pain: Secondary | ICD-10-CM

## 2020-11-30 LAB — CBC WITH DIFFERENTIAL/PLATELET
Abs Immature Granulocytes: 0.06 10*3/uL (ref 0.00–0.07)
Basophils Absolute: 0.1 10*3/uL (ref 0.0–0.1)
Basophils Relative: 1 %
Eosinophils Absolute: 0.4 10*3/uL (ref 0.0–0.5)
Eosinophils Relative: 4 %
HCT: 38.8 % (ref 36.0–46.0)
Hemoglobin: 12 g/dL (ref 12.0–15.0)
Immature Granulocytes: 1 %
Lymphocytes Relative: 29 %
Lymphs Abs: 3.1 10*3/uL (ref 0.7–4.0)
MCH: 30.9 pg (ref 26.0–34.0)
MCHC: 30.9 g/dL (ref 30.0–36.0)
MCV: 100 fL (ref 80.0–100.0)
Monocytes Absolute: 0.8 10*3/uL (ref 0.1–1.0)
Monocytes Relative: 7 %
Neutro Abs: 6.2 10*3/uL (ref 1.7–7.7)
Neutrophils Relative %: 58 %
Platelets: 297 10*3/uL (ref 150–400)
RBC: 3.88 MIL/uL (ref 3.87–5.11)
RDW: 13 % (ref 11.5–15.5)
WBC: 10.6 10*3/uL — ABNORMAL HIGH (ref 4.0–10.5)
nRBC: 0 % (ref 0.0–0.2)

## 2020-11-30 LAB — COMPREHENSIVE METABOLIC PANEL
ALT: 14 U/L (ref 0–44)
AST: 25 U/L (ref 15–41)
Albumin: 4 g/dL (ref 3.5–5.0)
Alkaline Phosphatase: 47 U/L (ref 38–126)
Anion gap: 11 (ref 5–15)
BUN: 12 mg/dL (ref 6–20)
CO2: 23 mmol/L (ref 22–32)
Calcium: 10.1 mg/dL (ref 8.9–10.3)
Chloride: 102 mmol/L (ref 98–111)
Creatinine, Ser: 0.57 mg/dL (ref 0.44–1.00)
GFR, Estimated: >60 mL/min (ref >60)
Glucose, Bld: 106 mg/dL — ABNORMAL HIGH (ref 70–99)
Potassium: 4.5 mmol/L (ref 3.5–5.1)
Sodium: 136 mmol/L (ref 135–145)
Total Bilirubin: 0.8 mg/dL (ref 0.3–1.2)
Total Protein: 7.5 g/dL (ref 6.5–8.1)

## 2020-11-30 LAB — URINALYSIS, ROUTINE W REFLEX MICROSCOPIC
Bilirubin Urine: NEGATIVE
Glucose, UA: NEGATIVE mg/dL
Hgb urine dipstick: NEGATIVE
Ketones, ur: 5 mg/dL — AB
Nitrite: NEGATIVE
Protein, ur: NEGATIVE mg/dL
Specific Gravity, Urine: 1.034 — ABNORMAL HIGH (ref 1.005–1.030)
pH: 6 (ref 5.0–8.0)

## 2020-11-30 LAB — LIPASE, BLOOD: Lipase: 29 U/L (ref 11–51)

## 2020-11-30 LAB — PREGNANCY, URINE: Preg Test, Ur: NEGATIVE

## 2020-11-30 MED ORDER — LIDOCAINE VISCOUS HCL 2 % MT SOLN
15.0000 mL | Freq: Once | OROMUCOSAL | Status: AC
  Administered 2020-11-30: 15 mL via ORAL
  Filled 2020-11-30: qty 15

## 2020-11-30 MED ORDER — ALUM & MAG HYDROXIDE-SIMETH 200-200-20 MG/5ML PO SUSP
30.0000 mL | Freq: Once | ORAL | Status: AC
  Administered 2020-11-30: 30 mL via ORAL
  Filled 2020-11-30: qty 30

## 2020-11-30 MED ORDER — PANTOPRAZOLE SODIUM 20 MG PO TBEC: 20.0000 mg | DELAYED_RELEASE_TABLET | Freq: Every day | ORAL | 0 refills | Status: DC

## 2020-11-30 MED ORDER — SUCRALFATE 1 G PO TABS: 1.0000 g | ORAL_TABLET | Freq: Three times a day (TID) | ORAL | 0 refills | Status: DC

## 2020-11-30 NOTE — ED Provider Notes (Signed)
[facility]-EMERGENCY DEPT Provider Note   CSN: [id number] Arrival date & time: 11/30/20  0809     History Chief Complaint  Patient presents with   Nausea    Vanessa Morton is a 35 y.o. female.  Patient is a 35 year old female with a history of hypertension and obesity who presents with abdominal pain.  She was recently treated for pelvic infection.  She said she had some foul-smelling discharge and urinary symptoms.  She had a pelvic exam about a week ago and was started on medications which included an antifungal medication as well as Flagyl.  She says those symptoms have started to improve.  She now has pain across her upper abdomen that is been going on about a week.  Its fairly constant.  She has some nausea and occasionally vomiting.  No fevers.  No change in bowels.  She has not been taking any medicine other than ibuprofen for the symptoms.      Past Medical History:  Diagnosis Date   Abscess    Anxiety    Chest pain    Gonorrhea    Hypertension    Morbid obesity (HCC)    Obesity    Urinary tract infection     Patient Active Problem List   Diagnosis Date Noted   Drug allergy 11/16/2020   Abscess of back 10/20/2018   Axillary abscess 10/20/2018   Morbid obesity (HCC) 10/20/2018   Rash/skin eruption 10/20/2018    Past Surgical History:  Procedure Laterality Date   CESAREAN SECTION     HYDRADENITIS EXCISION N/A 03/04/2019   Procedure: EXCISION HIDRADENITIS RIGHT AXILLA X2 AND BACK X2;  Surgeon: Griselda Miner, MD;  Location: [facility] OR;  Service: General;  Laterality: N/A;   TONSILLECTOMY       OB History   No obstetric history on file.     Family History  Problem Relation Age of Onset   Hypertension Mother    Diabetes Mother    Diabetes Other    Hypertension Other     Social History   Tobacco Use   Smoking status: Never   Smokeless tobacco: Never  Vaping Use   Vaping Use: Never used  Substance Use Topics   Alcohol use: No    Drug use: No    Home Medications Prior to Admission medications   Medication Sig Start Date End Date Taking? Authorizing Provider  pantoprazole (PROTONIX) 20 MG tablet Take 1 tablet (20 mg total) by mouth daily. 11/30/20  Yes Rolan Bucco, MD  sucralfate (CARAFATE) 1 g tablet Take 1 tablet (1 g total) by mouth 4 (four) times daily -  with meals and at bedtime. 11/30/20  Yes Rolan Bucco, MD  Buprenorphine HCl-Naloxone HCl 8-2 MG FILM Take 1 Film by mouth 3 (three) times daily.  07/02/18   [provider]  busPIRone (BUSPAR) 10 MG tablet Take 10 mg by mouth 2 (two) times daily. 09/26/20   [provider]  cyclobenzaprine (FLEXERIL) 10 MG tablet Take 1 tablet (10 mg total) by mouth daily as needed for muscle spasms. 11/08/20   Ernie Avena, MD  EPINEPHrine 0.3 mg/0.3 mL IJ SOAJ injection SMARTSIG:1 Pre-Filled Pen Syringe IM Daily 10/27/20   [provider]  FEROSUL 325 (65 Fe) MG tablet Take 325 mg by mouth daily. 09/07/20   [provider]  gabapentin (NEURONTIN) 400 MG capsule Take 400 mg by mouth 3 (three) times daily. 09/29/20   [provider]  hydrochlorothiazide (HYDRODIURIL) 25 MG  tablet Take 25 mg by mouth every morning. 11/25/19   [provider]  ibuprofen (ADVIL) 800 MG tablet Take 800 mg by mouth 3 (three) times daily. 10/10/20   [provider]  losartan (COZAAR) 50 MG tablet Take 50 mg by mouth daily. 10/29/19   [provider]  meloxicam (MOBIC) 7.5 MG tablet Take 1 tablet (7.5 mg total) by mouth daily as needed for pain. 11/08/20   Ernie Avena, MD  zolpidem (AMBIEN) 10 MG tablet Take 10 mg by mouth at bedtime.    [provider]    Allergies    Amoxicillin and Clindamycin  Review of Systems   Review of Systems  Constitutional:  Negative for chills, diaphoresis, fatigue and fever.  HENT:  Negative for congestion, rhinorrhea and sneezing.   Eyes: Negative.   Respiratory:  Negative for cough,  chest tightness and shortness of breath.   Cardiovascular:  Negative for chest pain and leg swelling.  Gastrointestinal:  Positive for abdominal pain, nausea and vomiting. Negative for blood in stool and diarrhea.  Genitourinary:  Negative for difficulty urinating, flank pain, frequency and hematuria.  Musculoskeletal:  Negative for arthralgias and back pain.  Skin:  Negative for rash.  Neurological:  Negative for dizziness, speech difficulty, weakness, numbness and headaches.   Physical Exam Updated Vital Signs BP 125/86 (BP Location: Right Arm)   Pulse 90   Temp 98.1 F (36.7 C) (Oral)   Resp 17   Ht 5\' 1"  (1.549 m)   Wt 130.6 kg   SpO2 96%   BMI 54.42 kg/m   Physical Exam Constitutional:      Appearance: She is well-developed. She is obese.  HENT:     Head: Normocephalic and atraumatic.  Eyes:     Pupils: Pupils are equal, round, and reactive to light.  Cardiovascular:     Rate and Rhythm: Normal rate and regular rhythm.     Heart sounds: Normal heart sounds.  Pulmonary:     Effort: Pulmonary effort is normal. No respiratory distress.     Breath sounds: Normal breath sounds. No wheezing or rales.  Chest:     Chest wall: No tenderness.  Abdominal:     General: Bowel sounds are normal.     Palpations: Abdomen is soft.     Tenderness: There is abdominal tenderness (Tenderness across her upper abdomen). There is no guarding or rebound.  Musculoskeletal:        General: Normal range of motion.     Cervical back: Normal range of motion and neck supple.  Lymphadenopathy:     Cervical: No cervical adenopathy.  Skin:    General: Skin is warm and dry.     Findings: No rash.  Neurological:     Mental Status: She is alert and oriented to person, place, and time.    ED Results / Procedures / Treatments   Labs (all labs ordered are listed, but only abnormal results are displayed) Labs Reviewed  URINALYSIS, ROUTINE W REFLEX MICROSCOPIC - Abnormal; Notable for the following  components:      Result Value   Color, Urine AMBER (*)    APPearance HAZY (*)    Specific Gravity, Urine 1.034 (*)    Ketones, ur 5 (*)    Leukocytes,Ua TRACE (*)    Bacteria, UA RARE (*)    All other components within normal limits  COMPREHENSIVE METABOLIC PANEL - Abnormal; Notable for the following components:   Glucose, Bld 106 (*)    All  other components within normal limits  CBC WITH DIFFERENTIAL/PLATELET - Abnormal; Notable for the following components:   WBC 10.6 (*)    All other components within normal limits  PREGNANCY, URINE  LIPASE, BLOOD    EKG None  Radiology US Abdomen Limited RUQ (LIVER/GB)  Result Date: 11/30/2020 CLINICAL DATA:  Right upper quadrant pain EXAM: ULTRASOUND ABDOMEN LIMITED RIGHT UPPER QUADRANT COMPARISON:  04/23/2017 FINDINGS: Gallbladder: No gallstones or wall thickening visualized. No sonographic Murphy sign noted by sonographer. Common bile duct: Diameter: 3 mm Liver: No focal lesion identified. Within normal limits in parenchymal echogenicity. Portal vein is patent on color Doppler imaging with normal direction of blood flow towards the liver. IMPRESSION: Negative right upper quadrant ultrasound. Electronically Signed   By: Tiburcio Pea M.D.   On: 11/30/2020 10:49    Procedures Procedures   Medications Ordered in ED Medications  alum & mag hydroxide-simeth (MAALOX/MYLANTA) 200-200-20 MG/5ML suspension 30 mL (30 mLs Oral Given 11/30/20 1100)    And  lidocaine (XYLOCAINE) 2 % viscous mouth solution 15 mL (15 mLs Oral Given 11/30/20 1100)    ED Course  I have reviewed the triage vital signs and the nursing notes.  Pertinent labs & imaging results that were available during my care of the patient were reviewed by me and considered in my medical decision making (see chart for details).    MDM Rules/Calculators/A&P                           Patient is a 35 year old female who presents with pain across her upper abdomen.  She previously  been treated for a UTI and vaginitis although the symptoms seem to be improving.  She does not have any current lower abdominal pain.  No fevers.  She had an ultrasound which shows no evidence of cholecystitis.  Her labs are nonconcerning.  There is no evidence of pancreatitis or liver disease.  Her urine is nonconcerning.  Her pregnancy test is negative.  She was given a GI cocktail with minimal improvement.  However at this point her pain seems to be most consistent with a gastritis.  She does not have any other abdominal tenderness that would make more concerning for appendicitis, colitis or other intra-abdominal pathology.  She was discharged home in good condition.  She was given a prescription for Protonix and Carafate.  She was given advice on foods to avoid.  She was encouraged to follow-up with her primary care doctor within the next week.  Return precautions were given. Final Clinical Impression(s) / ED Diagnoses Final diagnoses:  RUQ pain  Acute gastritis without hemorrhage, unspecified gastritis type    Rx / DC Orders ED Discharge Orders          Ordered    pantoprazole (PROTONIX) 20 MG tablet  Daily        11/30/20 1245    sucralfate (CARAFATE) 1 g tablet  3 times daily with meals & bedtime        11/30/20 1245             Rolan Bucco, MD 11/30/20 1248

## 2020-11-30 NOTE — ED Triage Notes (Signed)
Pt presents to ED via pov cc nausea, abd pain. Pt states she started using a new soap that she found out she was allergic to. Pt states she saw her PCP and was prescribed medications for sensitivity/pain in her groin x1 week ago. Pt states no relief. Pt reports nausea, abd pain associated.

## 2021-03-28 NOTE — H&P (Signed)
° °  Patient: Vanessa Morton   DOB: 1985/04/21  SEX: Female   Patient referred by  DDS for extraction teeth   CC: Painful teeth  Past Medical History:  Borderline Diabetes, Mental Health problems, Morbid Obesity, High Blood Pressure, Swollen ankles, Bruise Easily, Dental anxiety   Medications: Iron Pill, Losartan, Ambien, Bupropion, Diflucan, Suboxone, Flexeril, Neurontin,HCTZ,    Allergies:     Penicillin, Clindamycin    Surgeries:   C Section, Oral Surgery, cyst removal, Tonsillectomy         Social History       Smoking: n           Alcohol:n Drug use:n                             Exam: BMI 57. Decay teeth # 2, 4, 15.  No purulence, edema, fluctuance, trismus. Oral cancer screening negative. Pharynx clear. No lymphadenopathy.  Panorex:Decay teeth # 2, 4, 15.   Assessment:  ASA 3, Non-restorable teeth #  2, 4, 15.             Plan: Extraction Teeth # 2, 4, 15.    Local/nitrous.                  Rx: none              Risks and complications explained. Questions answered.   Gae Bon, DMD

## 2021-04-02 ENCOUNTER — Other Ambulatory Visit: Payer: Self-pay

## 2021-04-02 ENCOUNTER — Encounter (HOSPITAL_COMMUNITY): Payer: Self-pay | Admitting: Oral Surgery

## 2021-04-02 NOTE — Anesthesia Preprocedure Evaluation (Addendum)
Anesthesia Evaluation    Reviewed: Allergy & Precautions, Patient's Chart, lab work & pertinent test results  History of Anesthesia Complications Negative for: history of anesthetic complications  Airway Mallampati: III  TM Distance: >3 FB Neck ROM: Full    Dental  (+) Dental Advisory Given, Poor Dentition   Pulmonary neg pulmonary ROS,    Pulmonary exam normal        Cardiovascular hypertension, Pt. on medications Normal cardiovascular exam   '13 TTE - Mild LVH. EF 45% to 50%. Grade 1 diastolic dysfunction. Trivial MR and PR     Neuro/Psych PSYCHIATRIC DISORDERS Anxiety negative neurological ROS     GI/Hepatic negative GI ROS, Neg liver ROS,   Endo/Other  diabetes, Type 2, Oral Hypoglycemic AgentsMorbid obesity  Renal/GU negative Renal ROS     Musculoskeletal negative musculoskeletal ROS (+)   Abdominal   Peds  Hematology negative hematology ROS (+)   Anesthesia Other Findings Anaphylaxis to PCN Buprenorphine use   Reproductive/Obstetrics                           Anesthesia Physical Anesthesia Plan  ASA: 3  Anesthesia Plan: General   Post-op Pain Management: Tylenol PO (pre-op)   Induction: Intravenous  PONV Risk Score and Plan: 3 and Treatment may vary due to age or medical condition, Ondansetron, Dexamethasone and Midazolam  Airway Management Planned: Nasal ETT  Additional Equipment: None  Intra-op Plan:   Post-operative Plan: Extubation in OR  Informed Consent: I have reviewed the patients History and Physical, chart, labs and discussed the procedure including the risks, benefits and alternatives for the proposed anesthesia with the patient or authorized representative who has indicated his/her understanding and acceptance.     Dental advisory given  Plan Discussed with: CRNA and Anesthesiologist  Anesthesia Plan Comments:       Anesthesia Quick  Evaluation

## 2021-04-02 NOTE — Progress Notes (Signed)
PCP - [place] Scrape Cardiologist - Denies EKG - Needs DOS Chest x-ray - denies ECHO - 02-20-2012 Cardiac Cath - denies CPAP - denies Fasting Blood Sugar:  Pt reports she does not have a Type II DM diagnosis, but was told she is pre diabetic. Does not check sugars at home Checks Blood Sugar:  doesn't own machine Blood Thinner Instructions: n/a Aspirin Instructions: n/a ERAS Protcol - NPO COVID TEST- ambulatory  Anesthesia review: made aware  -------------  SDW INSTRUCTIONS:  Your procedure is scheduled on April 03, 2021. Please report to [facility] Main Entrance "A" at 12:30pm., and check in at the Admitting office. Call this number if you have problems the morning of surgery: [phone]   Remember: Do not eat or drink after midnight the night before your surgery   Medications to take morning of surgery with a sip of water include: Gabapentin, Bactrim  HOLD Buprenorphine-Naloxone for 3 days as discussed with your doctor  NO Metformin or Ozempic the day of surgery  As of today, STOP taking any Aspirin (unless otherwise instructed by your surgeon), Aleve, Naproxen, Ibuprofen, Motrin, Advil, Goody's, BC's, all herbal medications, fish oil, and all vitamins.    The Morning of Surgery Do not wear jewelry, make-up or nail polish. Do not wear lotions, powders, or perfumes/colognes, or deodorant Do not bring valuables to the hospital. [facility] is not responsible for any belongings or valuables.  If you are a smoker, DO NOT Smoke 24 hours prior to surgery  If you wear a CPAP at night please bring your mask the morning of surgery   Remember that you must have someone to transport you home after your surgery, and remain with you for 24 hours if you are discharged the same day.  Please bring cases for contacts, glasses, hearing aids, dentures or bridgework because it cannot be worn into surgery.   Patients discharged the day of surgery will not be allowed to drive home.    Please shower the NIGHT BEFORE/MORNING OF SURGERY (use antibacterial soap like DIAL soap if possible). Wear comfortable clothes the morning of surgery. Oral Hygiene is also important to reduce your risk of infection.  Remember - BRUSH YOUR TEETH THE MORNING OF SURGERY WITH YOUR REGULAR TOOTHPASTE  Patient denies shortness of breath, fever, cough and chest pain.

## 2021-04-02 NOTE — Progress Notes (Signed)
Anesthesia Chart Review: Same day workup  Hx of frequent ED visits for atypical chest pain dating back to at least 2013, most recently recently 12/10/19. Workups have been benign, negative enzymes, EKG without ischemic changes.  Does have history of chronic cardiomegaly on imaging going back to at least 2013.  History of prediabetes.  Last A1c in Care Everywhere 6.1 on 05/11/2020.  Patient follows with bariatrics at [facility], per last office visit note 02/05/2021 patient has lost 24 pounds through their program with diet and exercise.  BMI 51.   Last EKG 12/10/2019: Sinus rhythm.  Rate 76. Nonspecific T abnormalities, anterior leads. No significant change since last tracing  Underwent excision of hidradenitis suppurativa [id number] without complication.   Will need DOS labs and eval.    TTE 2013 (pt possibly seen at [facility] around this time but Sadie Haber was unable to locate any records on her): Left ventricle: Wall thickness was increased in a pattern  of mild LVH. Systolic function was mildly reduced. The  estimated ejection fraction was in the range of 45% to 50%.  Wall motion was normal; there were no regional wall motion  abnormalities. Doppler parameters are consistent with  abnormal left ventricular relaxation (grade 1 diastolic  dysfunction).     Wynonia Musty [facility] Short Stay Center/Anesthesiology Phone [phone] 04/02/2021 4:44 PM

## 2021-04-03 ENCOUNTER — Encounter (HOSPITAL_COMMUNITY): Payer: Self-pay | Admitting: Oral Surgery

## 2021-04-03 ENCOUNTER — Ambulatory Visit (HOSPITAL_COMMUNITY): Payer: Medicaid Other | Admitting: Physician Assistant

## 2021-04-03 ENCOUNTER — Ambulatory Visit (HOSPITAL_COMMUNITY)
Admission: RE | Admit: 2021-04-03 | Discharge: 2021-04-03 | Disposition: A | Discharge status: Home / Self Care | Payer: Medicaid Other | Source: Ambulatory Visit | Attending: Oral Surgery | Admitting: Oral Surgery

## 2021-04-03 ENCOUNTER — Encounter (HOSPITAL_COMMUNITY)
Admission: RE | Disposition: A | Discharge status: Home / Self Care | Payer: Self-pay | Source: Ambulatory Visit | Attending: Oral Surgery

## 2021-04-03 ENCOUNTER — Other Ambulatory Visit: Payer: Self-pay

## 2021-04-03 DIAGNOSIS — K029 Dental caries, unspecified: Secondary | ICD-10-CM | POA: Insufficient documentation

## 2021-04-03 DIAGNOSIS — Z79899 Other long term (current) drug therapy: Secondary | ICD-10-CM | POA: Insufficient documentation

## 2021-04-03 DIAGNOSIS — Z7984 Long term (current) use of oral hypoglycemic drugs: Secondary | ICD-10-CM | POA: Insufficient documentation

## 2021-04-03 DIAGNOSIS — E119 Type 2 diabetes mellitus without complications: Secondary | ICD-10-CM | POA: Insufficient documentation

## 2021-04-03 DIAGNOSIS — F419 Anxiety disorder, unspecified: Secondary | ICD-10-CM | POA: Insufficient documentation

## 2021-04-03 DIAGNOSIS — I10 Essential (primary) hypertension: Secondary | ICD-10-CM | POA: Insufficient documentation

## 2021-04-03 DIAGNOSIS — Z6841 Body Mass Index (BMI) 40.0 and over, adult: Secondary | ICD-10-CM | POA: Diagnosis not present

## 2021-04-03 DIAGNOSIS — Z881 Allergy status to other antibiotic agents status: Secondary | ICD-10-CM | POA: Insufficient documentation

## 2021-04-03 DIAGNOSIS — Z880 Allergy status to penicillin: Secondary | ICD-10-CM | POA: Insufficient documentation

## 2021-04-03 HISTORY — PX: TOOTH EXTRACTION: SHX859

## 2021-04-03 HISTORY — DX: Prediabetes: R73.03

## 2021-04-03 HISTORY — DX: Anemia, unspecified: D64.9

## 2021-04-03 LAB — CBC
HCT: 33.2 % — ABNORMAL LOW (ref 36.0–46.0)
Hemoglobin: 10.6 g/dL — ABNORMAL LOW (ref 12.0–15.0)
MCH: 30.4 pg (ref 26.0–34.0)
MCHC: 31.9 g/dL (ref 30.0–36.0)
MCV: 95.1 fL (ref 80.0–100.0)
Platelets: 311 10*3/uL (ref 150–400)
RBC: 3.49 MIL/uL — ABNORMAL LOW (ref 3.87–5.11)
RDW: 13 % (ref 11.5–15.5)
WBC: 12 10*3/uL — ABNORMAL HIGH (ref 4.0–10.5)
nRBC: 0 % (ref 0.0–0.2)

## 2021-04-03 LAB — BASIC METABOLIC PANEL
Anion gap: 14 (ref 5–15)
BUN: 12 mg/dL (ref 6–20)
CO2: 24 mmol/L (ref 22–32)
Calcium: 8.9 mg/dL (ref 8.9–10.3)
Chloride: 100 mmol/L (ref 98–111)
Creatinine, Ser: 0.87 mg/dL (ref 0.44–1.00)
GFR, Estimated: >60 mL/min (ref >60)
Glucose, Bld: 76 mg/dL (ref 70–99)
Potassium: 3.6 mmol/L (ref 3.5–5.1)
Sodium: 138 mmol/L (ref 135–145)

## 2021-04-03 LAB — GLUCOSE, CAPILLARY
Glucose-Capillary: 83 mg/dL (ref 70–99)
Glucose-Capillary: 55 mg/dL — ABNORMAL LOW (ref 70–99)
Glucose-Capillary: 99 mg/dL (ref 70–99)

## 2021-04-03 LAB — POCT PREGNANCY, URINE: Preg Test, Ur: NEGATIVE

## 2021-04-03 SURGERY — DENTAL RESTORATION/EXTRACTIONS
Anesthesia: General

## 2021-04-03 MED ORDER — PROPOFOL 10 MG/ML IV BOLUS
INTRAVENOUS | Status: AC
  Filled 2021-04-03: qty 20

## 2021-04-03 MED ORDER — SODIUM CHLORIDE 0.9 % IR SOLN
Status: DC | PRN
  Administered 2021-04-03: 1

## 2021-04-03 MED ORDER — DEXTROSE 50 % IV SOLN: 25.0000 mL | Freq: Once | INTRAVENOUS | Status: AC

## 2021-04-03 MED ORDER — PROPOFOL 10 MG/ML IV BOLUS
INTRAVENOUS | Status: DC | PRN
  Administered 2021-04-03: 50 mg via INTRAVENOUS
  Administered 2021-04-03: 200 mg via INTRAVENOUS

## 2021-04-03 MED ORDER — SUCCINYLCHOLINE CHLORIDE 200 MG/10ML IV SOSY
PREFILLED_SYRINGE | INTRAVENOUS | Status: AC
  Filled 2021-04-03: qty 10

## 2021-04-03 MED ORDER — LIDOCAINE-EPINEPHRINE 2 %-1:100000 IJ SOLN
INTRAMUSCULAR | Status: DC | PRN
  Administered 2021-04-03: 8 mL via INTRADERMAL

## 2021-04-03 MED ORDER — OXYCODONE HCL 5 MG PO TABS: 5.0000 mg | ORAL_TABLET | Freq: Once | ORAL | Status: DC | PRN

## 2021-04-03 MED ORDER — GLYCOPYRROLATE PF 0.2 MG/ML IJ SOSY
PREFILLED_SYRINGE | INTRAMUSCULAR | Status: DC | PRN
  Administered 2021-04-03: .2 mg via INTRAVENOUS

## 2021-04-03 MED ORDER — SUCCINYLCHOLINE CHLORIDE 200 MG/10ML IV SOSY
PREFILLED_SYRINGE | INTRAVENOUS | Status: DC | PRN
Start: 2021-04-03 — End: 2021-04-03
  Administered 2021-04-03: 140 mg via INTRAVENOUS

## 2021-04-03 MED ORDER — METRONIDAZOLE 500 MG/100ML IV SOLN
500.0000 mg | INTRAVENOUS | Status: AC
Start: 2021-04-03 — End: 2021-04-03
  Administered 2021-04-03: 500 mg via INTRAVENOUS
  Filled 2021-04-03: qty 100

## 2021-04-03 MED ORDER — FENTANYL CITRATE (PF) 250 MCG/5ML IJ SOLN
INTRAMUSCULAR | Status: DC | PRN
  Administered 2021-04-03: 100 ug via INTRAVENOUS
  Administered 2021-04-03 (×3): 50 ug via INTRAVENOUS

## 2021-04-03 MED ORDER — DEXTROSE 50 % IV SOLN
INTRAVENOUS | Status: AC
  Administered 2021-04-03: 25 mL via INTRAVENOUS
  Filled 2021-04-03: qty 50

## 2021-04-03 MED ORDER — PROMETHAZINE HCL 25 MG/ML IJ SOLN: 6.2500 mg | INTRAMUSCULAR | Status: DC | PRN

## 2021-04-03 MED ORDER — OXYCODONE-ACETAMINOPHEN 5-325 MG PO TABS: 1.0000 | ORAL_TABLET | ORAL | 0 refills | Status: DC | PRN

## 2021-04-03 MED ORDER — FENTANYL CITRATE (PF) 250 MCG/5ML IJ SOLN
INTRAMUSCULAR | Status: AC
  Filled 2021-04-03: qty 5

## 2021-04-03 MED ORDER — ACETAMINOPHEN 500 MG PO TABS
1000.0000 mg | ORAL_TABLET | Freq: Once | ORAL | Status: AC
  Administered 2021-04-03: 1000 mg via ORAL
  Filled 2021-04-03: qty 2

## 2021-04-03 MED ORDER — MIDAZOLAM HCL 2 MG/2ML IJ SOLN
INTRAMUSCULAR | Status: DC | PRN
Start: 2021-04-03 — End: 2021-04-03
  Administered 2021-04-03: 2 mg via INTRAVENOUS

## 2021-04-03 MED ORDER — LIDOCAINE-EPINEPHRINE 2 %-1:100000 IJ SOLN
INTRAMUSCULAR | Status: AC
  Filled 2021-04-03: qty 1

## 2021-04-03 MED ORDER — GLYCOPYRROLATE PF 0.2 MG/ML IJ SOSY
PREFILLED_SYRINGE | INTRAMUSCULAR | Status: AC
  Filled 2021-04-03: qty 1

## 2021-04-03 MED ORDER — 0.9 % SODIUM CHLORIDE (POUR BTL) OPTIME
TOPICAL | Status: DC | PRN
  Administered 2021-04-03: 1000 mL

## 2021-04-03 MED ORDER — ORAL CARE MOUTH RINSE: 15.0000 mL | Freq: Once | OROMUCOSAL | Status: AC

## 2021-04-03 MED ORDER — OXYCODONE HCL 5 MG/5ML PO SOLN: 5.0000 mg | Freq: Once | ORAL | Status: DC | PRN

## 2021-04-03 MED ORDER — LACTATED RINGERS IV SOLN: INTRAVENOUS | Status: DC

## 2021-04-03 MED ORDER — CHLORHEXIDINE GLUCONATE 0.12 % MT SOLN
15.0000 mL | Freq: Once | OROMUCOSAL | Status: AC
  Administered 2021-04-03: 15 mL via OROMUCOSAL
  Filled 2021-04-03: qty 15

## 2021-04-03 MED ORDER — FENTANYL CITRATE (PF) 100 MCG/2ML IJ SOLN: 25.0000 ug | INTRAMUSCULAR | Status: DC | PRN

## 2021-04-03 MED ORDER — PHENYLEPHRINE 40 MCG/ML (10ML) SYRINGE FOR IV PUSH (FOR BLOOD PRESSURE SUPPORT)
PREFILLED_SYRINGE | INTRAVENOUS | Status: DC | PRN
Start: 2021-04-03 — End: 2021-04-03
  Administered 2021-04-03: 200 ug via INTRAVENOUS

## 2021-04-03 MED ORDER — LACTATED RINGERS IV SOLN: INTRAVENOUS | Status: DC | PRN

## 2021-04-03 MED ORDER — ONDANSETRON HCL 4 MG/2ML IJ SOLN
INTRAMUSCULAR | Status: AC
  Filled 2021-04-03: qty 2

## 2021-04-03 MED ORDER — ONDANSETRON HCL 4 MG/2ML IJ SOLN
INTRAMUSCULAR | Status: DC | PRN
  Administered 2021-04-03: 4 mg via INTRAVENOUS

## 2021-04-03 MED ORDER — MIDAZOLAM HCL 2 MG/2ML IJ SOLN
INTRAMUSCULAR | Status: AC
  Filled 2021-04-03: qty 2

## 2021-04-03 SURGICAL SUPPLY — 37 items
BAG COUNTER SPONGE SURGICOUNT (BAG) IMPLANT
BLADE SURG 15 STRL LF DISP TIS (BLADE) ×1 IMPLANT
BLADE SURG 15 STRL SS (BLADE) ×2
BUR CROSS CUT FISSURE 1.6 (BURR) ×2 IMPLANT
BUR EGG ELITE 4.0 (BURR) ×2 IMPLANT
CANISTER SUCT 3000ML PPV (MISCELLANEOUS) ×2 IMPLANT
COVER SURGICAL LIGHT HANDLE (MISCELLANEOUS) ×2 IMPLANT
DECANTER SPIKE VIAL GLASS SM (MISCELLANEOUS) ×2 IMPLANT
DRAPE U-SHAPE 76X120 STRL (DRAPES) ×2 IMPLANT
GAUZE 4X4 16PLY ~~LOC~~+RFID DBL (SPONGE) ×1 IMPLANT
GAUZE PACKING FOLDED 2  STR (GAUZE/BANDAGES/DRESSINGS) ×2
GAUZE PACKING FOLDED 2 STR (GAUZE/BANDAGES/DRESSINGS) ×1 IMPLANT
GLOVE SURG ENC MOIS LTX SZ6.5 (GLOVE) IMPLANT
GLOVE SURG ENC MOIS LTX SZ7 (GLOVE) IMPLANT
GLOVE SURG ENC MOIS LTX SZ8 (GLOVE) ×2 IMPLANT
GLOVE SURG UNDER POLY LF SZ6.5 (GLOVE) IMPLANT
GLOVE SURG UNDER POLY LF SZ7 (GLOVE) IMPLANT
GOWN STRL REUS W/ TWL LRG LVL3 (GOWN DISPOSABLE) ×1 IMPLANT
GOWN STRL REUS W/ TWL XL LVL3 (GOWN DISPOSABLE) ×1 IMPLANT
GOWN STRL REUS W/TWL LRG LVL3 (GOWN DISPOSABLE) ×2
GOWN STRL REUS W/TWL XL LVL3 (GOWN DISPOSABLE) ×2
IV NS 1000ML (IV SOLUTION) ×2
IV NS 1000ML BAXH (IV SOLUTION) ×1 IMPLANT
KIT BASIN OR (CUSTOM PROCEDURE TRAY) ×2 IMPLANT
KIT TURNOVER KIT B (KITS) ×2 IMPLANT
NDL HYPO 25GX1X1/2 BEV (NEEDLE) ×2 IMPLANT
NEEDLE HYPO 25GX1X1/2 BEV (NEEDLE) ×4 IMPLANT
NS IRRIG 1000ML POUR BTL (IV SOLUTION) ×2 IMPLANT
PAD ARMBOARD 7.5X6 YLW CONV (MISCELLANEOUS) ×2 IMPLANT
SLEEVE IRRIGATION ELITE 7 (MISCELLANEOUS) ×2 IMPLANT
SPONGE SURGIFOAM ABS GEL 12-7 (HEMOSTASIS) IMPLANT
SUT CHROMIC 3 0 PS 2 (SUTURE) ×2 IMPLANT
SYR BULB IRRIG 60ML STRL (SYRINGE) ×2 IMPLANT
SYR CONTROL 10ML LL (SYRINGE) ×2 IMPLANT
TRAY ENT MC OR (CUSTOM PROCEDURE TRAY) ×2 IMPLANT
TUBING IRRIGATION (MISCELLANEOUS) ×2 IMPLANT
YANKAUER SUCT BULB TIP NO VENT (SUCTIONS) ×2 IMPLANT

## 2021-04-03 NOTE — Anesthesia Postprocedure Evaluation (Signed)
Anesthesia Post Note  Patient: Vanessa Morton  Procedure(s) Performed: DENTAL RESTORATION/EXTRACTIONS     Patient location during evaluation: PACU Anesthesia Type: General Level of consciousness: awake Pain management: pain level controlled Vital Signs Assessment: post-procedure vital signs reviewed and stable Respiratory status: spontaneous breathing Cardiovascular status: tachycardic Postop Assessment: no apparent nausea or vomiting Anesthetic complications: no   No notable events documented.  Last Vitals:  Vitals:   04/03/21 1610 04/03/21 1615  BP: 129/80   Pulse: (!) 112 (!) 114  Resp: 19 16  Temp: (!) 36.3 C   SpO2: 95% 92%    Last Pain:  Vitals:   04/03/21 1324  TempSrc:   PainSc: [name]

## 2021-04-03 NOTE — Op Note (Signed)
NAME: LABELLA, ZAHRADNIK MEDICAL RECORD NO: [id number] ACCOUNT NO: [id number] DATE OF BIRTH: [birth year] FACILITY: [facility] LOCATION: [facility] PHYSICIAN: Georgia Lopes, DDS  Operative Report   DATE OF PROCEDURE: 04/03/2021  PREOPERATIVE DIAGNOSIS:  Nonrestorable teeth secondary to dental caries numbers 2, 5 and 15.  POSTOPERATIVE DIAGNOSIS:  Nonrestorable teeth secondary to dental caries numbers 2, 5 and 15.  PROCEDURES:  Extraction teeth numbers 2, 5 and 15.  SURGEON:  Georgia Lopes, DDS  ANESTHESIA:  General oral intubation.  ATTENDING:  Dr. Mal Amabile.  DESCRIPTION OF PROCEDURE:  The patient was taken to the operating room and placed on the table in supine position.  General anesthesia was administered.  An oral endotracheal tube was placed and secured.  The patient was draped for surgery.  Timeout was  performed.  The posterior pharynx was suctioned and a throat pack was placed, 2% lidocaine 1:100,000 epinephrine was infiltrated around tooth #15.  A 15 blade was then used to make an incision in the gingival sulcus.  The tooth was elevated slightly with  a 301 elevator. Bone was removed using a Stryker handpiece.  Then, the tooth was elevated and removed with dental forceps.  The socket was curetted, irrigated and closed with 3-0 chromic.  The bite block and sweetheart were repositioned to the left side  of the mouth and the right side was operated.  Local anesthesia 2% lidocaine with 1:100,000 epinephrine was infiltrated around teeth numbers 2 and 5.  Then, a 15 blade was used to make an incision around these teeth and gingival sulcus so teeth could  not be elevated or luxated with 301 elevators.  Bone was removed from around these teeth with the Stryker handpiece under irrigation and then the teeth were elevated and removed with the upper forceps.  The sockets were curetted and irrigated and closed  with 3-0 chromic.  Then, the oral cavity was irrigated and suctioned.  The throat pack was  removed.  The patient was left under the care of anesthesia for extubation and transport to recovery room with plans for discharge to home through day surgery.  ESTIMATED BLOOD LOSS:  Minimum.  COMPLICATIONS:  None.  SPECIMENS:  None.   PUS D: 04/03/2021 4:02:50 pm T: 04/03/2021 6:40:00 pm  JOB: [id number]/ [id number]

## 2021-04-03 NOTE — H&P (Signed)
Anesthesia H&P Update: History and Physical Exam reviewed; patient is OK for planned anesthetic and procedure. ? ?

## 2021-04-03 NOTE — H&P (Signed)
H&P documentation  -History and Physical Reviewed  -Patient has been re-examined  -No change in the plan of care  Vanessa Morton  

## 2021-04-03 NOTE — Op Note (Signed)
04/03/2021  3:59 PM  PATIENT:  Vanessa Morton  36 y.o. female  PRE-OPERATIVE DIAGNOSIS:  NON RESTORABLE TEETH # 2, 5, 15 SECONDARY TO DENTAL CARIES  POST-OPERATIVE DIAGNOSIS:  SAME  PROCEDURE:  Procedure(s): EXTRACTIONS TEETH # 2, 5, 15  SURGEON:  Surgeon(s): Ocie Doyne, DMD  ANESTHESIA:   local and general  EBL:  minimal  DRAINS: none   SPECIMEN:  No Specimen  COUNTS:  YES  PLAN OF CARE: Discharge to home after PACU  PATIENT DISPOSITION:  PACU - hemodynamically stable.   PROCEDURE DETAILS: Dictation # [id number]  Vanessa Morton, DMD 04/03/2021 3:59 PM

## 2021-04-03 NOTE — Transfer of Care (Signed)
Immediate Anesthesia Transfer of Care Note  Patient: Vanessa Morton  Procedure(s) Performed: DENTAL RESTORATION/EXTRACTIONS  Patient Location: PACU  Anesthesia Type:General  Level of Consciousness: awake, drowsy, patient cooperative and responds to stimulation  Airway & Oxygen Therapy: Patient Spontanous Breathing and Patient connected to nasal cannula oxygen  Post-op Assessment: Report given to RN and Post -op Vital signs reviewed and stable  Post vital signs: Reviewed and stable  Last Vitals:  Vitals Value Taken Time  BP 129/80 04/03/21 1609  Temp    Pulse 112 04/03/21 1609  Resp 19 04/03/21 1609  SpO2 95 % 04/03/21 1609  Vitals shown include unvalidated device data.  Last Pain:  Vitals:   04/03/21 1324  TempSrc:   PainSc: 8          Complications: No notable events documented.

## 2021-04-03 NOTE — Anesthesia Procedure Notes (Signed)
Procedure Name: Intubation Date/Time: 04/03/2021 3:36 PM Performed by: Cathren Harsh, CRNA Pre-anesthesia Checklist: Patient identified, Emergency Drugs available, Suction available and Patient being monitored Patient Re-evaluated:Patient Re-evaluated prior to induction Oxygen Delivery Method: Circle System Utilized Preoxygenation: Pre-oxygenation with 100% oxygen Induction Type: IV induction Laryngoscope Size: Glidescope and 3 Grade View: Grade I Tube type: Oral Rae Tube size: 7.0 mm Number of attempts: 1 Airway Equipment and Method: Stylet and Oral airway Placement Confirmation: ETT inserted through vocal cords under direct vision, positive ETCO2 and breath sounds checked- equal and bilateral Secured at: 20 cm Tube secured with: Tape Dental Injury: Teeth and Oropharynx as per pre-operative assessment  Difficulty Due To: Difficulty was anticipated and Difficult Airway- due to large tongue Comments: Mask ventilation NOT attempted.

## 2021-04-04 ENCOUNTER — Encounter (HOSPITAL_COMMUNITY): Payer: Self-pay | Admitting: Oral Surgery

## 2021-05-03 ENCOUNTER — Ambulatory Visit (HOSPITAL_COMMUNITY)
Admission: EM | Admit: 2021-05-03 | Discharge: 2021-05-03 | Disposition: A | Discharge status: Home / Self Care | Payer: Medicaid Other | Attending: Family Medicine | Admitting: Family Medicine

## 2021-05-03 ENCOUNTER — Ambulatory Visit (INDEPENDENT_AMBULATORY_CARE_PROVIDER_SITE_OTHER): Payer: Medicaid Other

## 2021-05-03 ENCOUNTER — Encounter (HOSPITAL_COMMUNITY): Payer: Self-pay

## 2021-05-03 DIAGNOSIS — W19XXXA Unspecified fall, initial encounter: Secondary | ICD-10-CM

## 2021-05-03 DIAGNOSIS — M7989 Other specified soft tissue disorders: Secondary | ICD-10-CM

## 2021-05-03 DIAGNOSIS — M79605 Pain in left leg: Secondary | ICD-10-CM

## 2021-05-03 MED ORDER — NAPROXEN 500 MG PO TABS: 500.0000 mg | ORAL_TABLET | Freq: Two times a day (BID) | ORAL | 0 refills | Status: DC

## 2021-05-03 NOTE — Discharge Instructions (Signed)
Take Naprosyn twice daily for pain.  Do not take additional NSAIDs including aspirin, ibuprofen/Advil, naproxen/Aleve with this medication as it can cause stomach bleeding.  Elevate your leg.  Go get ultrasound tomorrow.  If develop any worsening symptoms including increased pain, swelling, chest pain, shortness of breath, heart racing you need to go to the emergency room.  If your ultrasound is negative and you continue to have pain please follow-up with sports medicine.

## 2021-05-03 NOTE — ED Provider Notes (Signed)
[facility]    CSN: [id number] Arrival date & time: 05/03/21  1836      History   Chief Complaint Chief Complaint  Patient presents with   Fall   Leg Pain    HPI Vanessa Morton is a 36 y.o. female.   Patient presents today with a 1.5-week history of left leg pain following fall.  Reports that she tripped over an open oven door causing her to fall.  Her pain is rated 7.5 on a strictly scale, localized to left knee with radiation into anterior leg, described as aching/throbbing, worse with palpation or attempted ambulation.  She has not tried any over-the-counter medication for symptom management.  She does report some swelling.  She denies any numbness or paresthesias.  Denies previous injury or surgery.  She did not hit her head during the fall.  She does report having upper extremity and chest/back soreness.  She continues to have some discomfort in her chest.  This is rated 5 on a 0-10 pain scale, present with palpation, no relieving factors identified.  Denies any shortness of breath, nausea, vomiting, lightheadedness, weakness.  Denies any association with activity.  She did discuss this with her bariatric provider who ordered an EKG and recommended that she be evaluated by cardiology.  She has a cardiology appointment next week.   Past Medical History:  Diagnosis Date   Abscess    Anemia    Anxiety    Chest pain    Gonorrhea    Hypertension    Morbid obesity ([place])    Obesity    Pre-diabetes    Urinary tract infection     Patient Active Problem List   Diagnosis Date Noted   Drug allergy 11/16/2020   Abscess of back 10/20/2018   Axillary abscess 10/20/2018   Morbid obesity ([place]) 10/20/2018   Rash/skin eruption 10/20/2018    Past Surgical History:  Procedure Laterality Date   CESAREAN SECTION     HYDRADENITIS EXCISION N/A 03/04/2019   Procedure: EXCISION HIDRADENITIS RIGHT AXILLA X2 AND BACK X2;  Surgeon: Jovita Kussmaul, MD;  Location: [facility];  Service:  General;  Laterality: N/A;   TONSILLECTOMY     TOOTH EXTRACTION N/A 04/03/2021   Procedure: DENTAL RESTORATION/EXTRACTIONS;  Surgeon: Diona Browner, DMD;  Location: [facility];  Service: Oral Surgery;  Laterality: N/A;    OB History   No obstetric history on file.      Home Medications    Prior to Admission medications   Medication Sig Start Date End Date Taking? Authorizing Provider  naproxen (NAPROSYN) 500 MG tablet Take 1 tablet (500 mg total) by mouth 2 (two) times daily. 05/03/21  Yes Christop Hippert, Derry Skill, PA-C  Aspirin-Acetaminophen-Caffeine (GOODYS EXTRA STRENGTH) [phone] MG PACK Take 1 packet by mouth 3 (three) times daily as needed (pain).    [provider]  Buprenorphine HCl-Naloxone HCl 8-2 MG FILM Place 1 Film under the tongue in the morning, at noon, and at bedtime.    [provider]  clindamycin (CLEOCIN T) 1 % external solution Apply 1 application topically daily as needed (irritation). 02/22/21   [provider]  EPINEPHrine 0.3 mg/0.3 mL IJ SOAJ injection Inject 0.3 mg into the muscle as needed for anaphylaxis. 10/27/20   [provider]  FEROSUL 325 (65 Fe) MG tablet Take 325 mg by mouth daily. 09/07/20   [provider]  gabapentin (NEURONTIN) 400 MG capsule Take 400 mg by mouth 3 (three) times daily. 09/29/20  [provider]  hydrochlorothiazide (HYDRODIURIL) 25 MG tablet Take 25 mg by mouth every morning. 11/25/19   [provider]  ibuprofen (ADVIL) 200 MG tablet Take 800 mg by mouth every 8 (eight) hours as needed for moderate pain. 10/10/20   [provider]  losartan (COZAAR) 50 MG tablet Take 50 mg by mouth daily. 10/29/19   [provider]  metFORMIN (GLUCOPHAGE) 500 MG tablet Take 500 mg by mouth daily with breakfast.    [provider]  mupirocin ointment (BACTROBAN) 2 % Apply 1 application topically daily. 03/27/21   [provider]  oxybutynin (DITROPAN-XL) 5 MG 24 hr tablet  Take 5 mg by mouth at bedtime.    [provider]  oxyCODONE-acetaminophen (PERCOCET) 5-325 MG tablet Take 1 tablet by mouth every 4 (four) hours as needed. 04/03/21   Diona Browner, DMD  pantoprazole (PROTONIX) 20 MG tablet Take 1 tablet (20 mg total) by mouth daily. Patient not taking: Reported on 03/29/2021 11/30/20   Malvin Johns, MD  Semaglutide, 1 MG/DOSE, (OZEMPIC, 1 MG/DOSE,) 4 MG/3ML SOPN Inject 1 mg into the skin every Wednesday.    [provider]  sertraline (ZOLOFT) 50 MG tablet Take 50 mg by mouth daily.    [provider]  sucralfate (CARAFATE) 1 g tablet Take 1 tablet (1 g total) by mouth 4 (four) times daily -  with meals and at bedtime. Patient not taking: Reported on 03/29/2021 11/30/20   Malvin Johns, MD  sulfamethoxazole-trimethoprim (BACTRIM DS) 800-160 MG tablet Take 1 tablet by mouth 2 (two) times daily. Take for 7 days, started 03/27/21    [provider]  zolpidem (AMBIEN) 10 MG tablet Take 10 mg by mouth at bedtime.    [provider]    Family History Family History  Problem Relation Age of Onset   Hypertension Mother    Diabetes Mother    Diabetes Other    Hypertension Other     Social History Social History   Tobacco Use   Smoking status: Never   Smokeless tobacco: Never  Vaping Use   Vaping Use: Never used  Substance Use Topics   Alcohol use: No   Drug use: No     Allergies   Amoxicillin and Clindamycin   Review of Systems Review of Systems  Constitutional:  Positive for activity change. Negative for appetite change, fatigue and fever.  Respiratory:  Negative for cough and shortness of breath.   Cardiovascular:  Positive for chest pain (wall) and leg swelling. Negative for palpitations.  Gastrointestinal:  Negative for abdominal pain, diarrhea, nausea and vomiting.  Musculoskeletal:  Positive for arthralgias, back pain and myalgias.  Neurological:  Negative for dizziness, light-headedness, numbness  and headaches.    Physical Exam Triage Vital Signs ED Triage Vitals  Enc Vitals Group     BP 05/03/21 1943 121/62     Pulse Rate 05/03/21 1943 74     Resp 05/03/21 1943 18     Temp 05/03/21 1943 98.6 F (37 C)     Temp src --      SpO2 05/03/21 1943 99 %     Weight --      Height --      Head Circumference --      Peak Flow --      Pain Score 05/03/21 1946 7     Pain Loc --      Pain Edu? --      Excl. in [place]? --  No data found.  Updated Vital Signs BP 121/62 (BP Location: Left Arm)    Pulse 74    Temp 98.6 F (37 C)    Resp 18    LMP 04/30/2021    SpO2 99%   Visual Acuity Right Eye Distance:   Left Eye Distance:   Bilateral Distance:    Right Eye Near:   Left Eye Near:    Bilateral Near:     Physical Exam Vitals reviewed.  Constitutional:      General: She is awake. She is not in acute distress.    Appearance: Normal appearance. She is well-developed. She is not ill-appearing.     Comments: Very pleasant female appears stated age in no acute distress sitting comfortably in exam room  HENT:     Head: Normocephalic and atraumatic.  Cardiovascular:     Rate and Rhythm: Normal rate and regular rhythm.     Heart sounds: Normal heart sounds, S1 normal and S2 normal. No murmur heard.    Comments: No significant anemia Pulmonary:     Effort: Pulmonary effort is normal.     Breath sounds: Normal breath sounds. No wheezing, rhonchi or rales.     Comments: Clear to auscultation bilaterally Chest:     Chest wall: Tenderness present. No deformity or swelling.     Comments: Chest discomfort reproducible on exam Abdominal:     Palpations: Abdomen is soft.     Tenderness: There is no abdominal tenderness.  Musculoskeletal:     Left upper leg: Swelling and laceration present.     Right lower leg: No edema.     Left lower leg: Tenderness and bony tenderness present. No edema.       Legs:     Comments: Tenderness palpation over proximal tibia.  No deformity noted.   Negative Homans' sign.  No pitting edema on exam.  Psychiatric:        Behavior: Behavior is cooperative.     UC Treatments / Results  Labs (all labs ordered are listed, but only abnormal results are displayed) Labs Reviewed - No data to display  EKG   Radiology DG Tibia/Fibula Left  Result Date: 05/03/2021 CLINICAL DATA:  Fall, left leg pain EXAM: LEFT TIBIA AND FIBULA - 2 VIEW COMPARISON:  None. FINDINGS: There is no evidence of fracture or other focal bone lesions. Mild diffuse subcutaneous edema within the left lower extremity. Soft tissues are otherwise unremarkable. IMPRESSION: Diffuse subcutaneous edema.  No fracture or dislocation. Electronically Signed   By: Fidela Salisbury M.D.   On: 05/03/2021 20:24    Procedures Procedures (including critical care time)  Medications Ordered in UC Medications - No data to display  Initial Impression / Assessment and Plan / UC Course  I have reviewed the triage vital signs and the nursing notes.  Pertinent labs & imaging results that were available during my care of the patient were reviewed by me and considered in my medical decision making (see chart for details).     X-ray obtained given prolonged history of pain showed no osseous abnormality.  Chest discomfort is reproducible on exam consistent with musculoskeletal etiology.  Low suspicion for DVT, however, given persistent pain with swelling and discomfort in groin we will obtain ultrasound study.  This was placed as an outpatient order and she was instructed to go to [facility] tomorrow to have imaging obtained.  We will treat with Naprosyn and she was instructed not to take additional NSAIDs with this medication  due to risk of GI bleeding.  She can use Tylenol for additional symptom relief.  Recommended she keep her leg elevated and use compression for additional symptom relief.  Discussed that if she has any worsening symptoms including increased pain, increased swelling, chest  discomfort, shortness of breath, heart racing she needs to go to the emergency room immediately.  Strict return precautions given to which she expressed understanding.  Work excuse note provided.  Final Clinical Impressions(s) / UC Diagnoses   Final diagnoses:  Left leg pain  Left leg swelling  Fall, initial encounter     Discharge Instructions      Take Naprosyn twice daily for pain.  Do not take additional NSAIDs including aspirin, ibuprofen/Advil, naproxen/Aleve with this medication as it can cause stomach bleeding.  Elevate your leg.  Go get ultrasound tomorrow.  If develop any worsening symptoms including increased pain, swelling, chest pain, shortness of breath, heart racing you need to go to the emergency room.  If your ultrasound is negative and you continue to have pain please follow-up with sports medicine.     ED Prescriptions     Medication Sig Dispense Auth. Provider   naproxen (NAPROSYN) 500 MG tablet Take 1 tablet (500 mg total) by mouth 2 (two) times daily. 10 tablet Trypp Heckmann, Derry Skill, PA-C      PDMP not reviewed this encounter.   Terrilee Croak, PA-C 05/03/21 2041

## 2021-05-03 NOTE — ED Triage Notes (Signed)
Pt states tripped and fell a week and a half ago. States hit her head on a cabinet and fell on tile floor. Pt c/o a knot to forehead and lt leg pain.   States had a EKG on Monday with her wt loss provider and has a cardiologist appt next week.

## 2021-05-04 ENCOUNTER — Ambulatory Visit (HOSPITAL_COMMUNITY)
Admission: RE | Admit: 2021-05-04 | Discharge: 2021-05-04 | Disposition: A | Discharge status: Home / Self Care | Payer: Medicaid Other | Source: Ambulatory Visit | Attending: Physician Assistant | Admitting: Physician Assistant

## 2021-05-04 ENCOUNTER — Other Ambulatory Visit: Payer: Self-pay

## 2021-05-04 DIAGNOSIS — M79605 Pain in left leg: Secondary | ICD-10-CM

## 2021-05-04 DIAGNOSIS — W19XXXA Unspecified fall, initial encounter: Secondary | ICD-10-CM | POA: Diagnosis not present

## 2021-07-20 ENCOUNTER — Ambulatory Visit: Payer: Medicaid Other | Attending: Nurse Practitioner | Admitting: Physical Therapy

## 2021-07-20 DIAGNOSIS — M6281 Muscle weakness (generalized): Secondary | ICD-10-CM

## 2021-07-20 DIAGNOSIS — R32 Unspecified urinary incontinence: Secondary | ICD-10-CM | POA: Diagnosis present

## 2021-07-20 DIAGNOSIS — M62838 Other muscle spasm: Secondary | ICD-10-CM

## 2021-07-20 DIAGNOSIS — R293 Abnormal posture: Secondary | ICD-10-CM
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
## 2021-07-21 ENCOUNTER — Encounter: Payer: Self-pay | Admitting: Physical Therapy

## 2021-07-25 ENCOUNTER — Ambulatory Visit: Payer: Medicaid Other | Admitting: Physical Therapy
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
## 2021-08-06 ENCOUNTER — Ambulatory Visit: Payer: Medicaid Other | Admitting: Physical Therapy

## 2021-08-06 ENCOUNTER — Encounter: Payer: Self-pay | Admitting: Physical Therapy

## 2021-08-06 DIAGNOSIS — R293 Abnormal posture: Secondary | ICD-10-CM

## 2021-08-06 DIAGNOSIS — R32 Unspecified urinary incontinence: Secondary | ICD-10-CM | POA: Diagnosis not present

## 2021-08-06 DIAGNOSIS — M62838 Other muscle spasm: Secondary | ICD-10-CM

## 2021-08-06 DIAGNOSIS — M6281 Muscle weakness (generalized): Secondary | ICD-10-CM

## 2021-08-18 ENCOUNTER — Emergency Department (HOSPITAL_COMMUNITY)
Admission: EM | Admit: 2021-08-18 | Discharge: 2021-08-18 | Disposition: A | Discharge status: Home / Self Care | Payer: Medicaid Other | Attending: Emergency Medicine | Admitting: Emergency Medicine

## 2021-08-18 ENCOUNTER — Emergency Department (HOSPITAL_BASED_OUTPATIENT_CLINIC_OR_DEPARTMENT_OTHER)
Admit: 2021-08-18 | Discharge: 2021-08-18 | Disposition: A | Discharge status: Home / Self Care | Payer: Medicaid Other | Attending: Emergency Medicine | Admitting: Emergency Medicine

## 2021-08-18 ENCOUNTER — Emergency Department (HOSPITAL_COMMUNITY): Payer: Medicaid Other

## 2021-08-18 ENCOUNTER — Encounter (HOSPITAL_COMMUNITY): Payer: Self-pay | Admitting: Emergency Medicine

## 2021-08-18 ENCOUNTER — Other Ambulatory Visit: Payer: Self-pay

## 2021-08-18 DIAGNOSIS — Z794 Long term (current) use of insulin: Secondary | ICD-10-CM | POA: Diagnosis not present

## 2021-08-18 DIAGNOSIS — R609 Edema, unspecified: Secondary | ICD-10-CM

## 2021-08-18 DIAGNOSIS — M79604 Pain in right leg: Secondary | ICD-10-CM | POA: Insufficient documentation

## 2021-08-18 DIAGNOSIS — M79605 Pain in left leg: Secondary | ICD-10-CM | POA: Diagnosis not present

## 2021-08-18 DIAGNOSIS — M7989 Other specified soft tissue disorders: Secondary | ICD-10-CM | POA: Diagnosis present

## 2021-08-18 LAB — CBC
HCT: 33.3 % — ABNORMAL LOW (ref 36.0–46.0)
Hemoglobin: 10.3 g/dL — ABNORMAL LOW (ref 12.0–15.0)
MCH: 29.6 pg (ref 26.0–34.0)
MCHC: 30.9 g/dL (ref 30.0–36.0)
MCV: 95.7 fL (ref 80.0–100.0)
Platelets: 228 10*3/uL (ref 150–400)
RBC: 3.48 MIL/uL — ABNORMAL LOW (ref 3.87–5.11)
RDW: 13.8 % (ref 11.5–15.5)
WBC: 6.6 10*3/uL (ref 4.0–10.5)
nRBC: 0 % (ref 0.0–0.2)

## 2021-08-18 LAB — BASIC METABOLIC PANEL
Anion gap: 6 (ref 5–15)
BUN: 9 mg/dL (ref 6–20)
CO2: 25 mmol/L (ref 22–32)
Calcium: 8.7 mg/dL — ABNORMAL LOW (ref 8.9–10.3)
Chloride: 109 mmol/L (ref 98–111)
Creatinine, Ser: 0.68 mg/dL (ref 0.44–1.00)
GFR, Estimated: >60 mL/min (ref >60)
Glucose, Bld: 110 mg/dL — ABNORMAL HIGH (ref 70–99)
Potassium: 3.7 mmol/L (ref 3.5–5.1)
Sodium: 140 mmol/L (ref 135–145)

## 2021-08-18 LAB — I-STAT BETA HCG BLOOD, ED (MC, WL, AP ONLY): I-stat hCG, quantitative: <5 m[IU]/mL (ref <5)

## 2021-08-18 MED ORDER — IBUPROFEN 600 MG PO TABS: 600.0000 mg | ORAL_TABLET | Freq: Three times a day (TID) | ORAL | 0 refills | Status: DC | PRN

## 2021-08-18 NOTE — Progress Notes (Signed)
Bilateral lower extremity venous duplex completed. Refer to "CV Proc" under chart review to view preliminary results.  08/18/2021 10:41 AM Eula Fried., MHA, RVT, RDCS, RDMS

## 2021-08-18 NOTE — ED Provider Notes (Signed)
[facility] Provider Note   CSN: [id number] Arrival date & time: 08/18/21  [id number]     History  Chief Complaint  Patient presents with   Leg Swelling    Vanessa Morton is a 36 y.o. female.  36 year old female with prior medical history as detailed below presents for evaluation.  Patient with complaint of chronic lower extremity pain and swelling.  Patient reports that her lower extremities have been painful and swollen for some time.  Both legs are a problem.  Patient reports that she is compliant with previously prescribed medications including diuretics.  She feels that the diuretics are not helping the swelling in her lower extremities.  She denies chest pain.  She denies shortness of breath at this examiner.  She is otherwise without complaint.  The history is provided by the patient and medical records.  Illness Location:  Lower extremity swelling and pain Severity:  Moderate Onset quality:  Unable to specify Timing:  Constant Progression:  Waxing and waning Chronicity:  Chronic     Home Medications Prior to Admission medications   Medication Sig Start Date End Date Taking? Authorizing Provider  Aspirin-Acetaminophen-Caffeine (GOODYS EXTRA STRENGTH) [phone] MG PACK Take 1 packet by mouth 3 (three) times daily as needed (pain).    [provider]  Buprenorphine HCl-Naloxone HCl 8-2 MG FILM Place 1 Film under the tongue in the morning, at noon, and at bedtime.    [provider]  clindamycin (CLEOCIN T) 1 % external solution Apply 1 application topically daily as needed (irritation). 02/22/21   [provider]  EPINEPHrine 0.3 mg/0.3 mL IJ SOAJ injection Inject 0.3 mg into the muscle as needed for anaphylaxis. 10/27/20   [provider]  FEROSUL 325 (65 Fe) MG tablet Take 325 mg by mouth daily. 09/07/20   [provider]  gabapentin (NEURONTIN) 400 MG capsule Take 400 mg by mouth 3  (three) times daily. 09/29/20   [provider]  hydrochlorothiazide (HYDRODIURIL) 25 MG tablet Take 25 mg by mouth every morning. 11/25/19   [provider]  ibuprofen (ADVIL) 200 MG tablet Take 800 mg by mouth every 8 (eight) hours as needed for moderate pain. 10/10/20   [provider]  losartan (COZAAR) 50 MG tablet Take 50 mg by mouth daily. 10/29/19   [provider]  metFORMIN (GLUCOPHAGE) 500 MG tablet Take 500 mg by mouth daily with breakfast.    [provider]  mupirocin ointment (BACTROBAN) 2 % Apply 1 application topically daily. 03/27/21   [provider]  naproxen (NAPROSYN) 500 MG tablet Take 1 tablet (500 mg total) by mouth 2 (two) times daily. 05/03/21   Raspet, Noberto RetortErin K, PA-C  oxybutynin (DITROPAN-XL) 5 MG 24 hr tablet Take 5 mg by mouth at bedtime.    [provider]  oxyCODONE-acetaminophen (PERCOCET) 5-325 MG tablet Take 1 tablet by mouth every 4 (four) hours as needed. 04/03/21   Ocie DoyneJensen, Scott, DMD  pantoprazole (PROTONIX) 20 MG tablet Take 1 tablet (20 mg total) by mouth daily. Patient not taking: Reported on 03/29/2021 11/30/20   Rolan BuccoBelfi, Melanie, MD  Semaglutide, 1 MG/DOSE, (OZEMPIC, 1 MG/DOSE,) 4 MG/3ML SOPN Inject 1 mg into the skin every Wednesday.    [provider]  sertraline (ZOLOFT) 50 MG tablet Take 50 mg by mouth daily.    [provider]  sucralfate (CARAFATE) 1 g tablet Take 1 tablet (1 g total) by mouth 4 (four) times daily -  with meals  and at bedtime. Patient not taking: Reported on 03/29/2021 11/30/20   Rolan Bucco, MD  sulfamethoxazole-trimethoprim (BACTRIM DS) 800-160 MG tablet Take 1 tablet by mouth 2 (two) times daily. Take for 7 days, started 03/27/21    [provider]  zolpidem (AMBIEN) 10 MG tablet Take 10 mg by mouth at bedtime.    [provider]      Allergies    Amoxicillin and Clindamycin    Review of Systems   Review of Systems  All other systems  reviewed and are negative.  Physical Exam Updated Vital Signs BP 122/68 (BP Location: Right Arm)   Pulse (!) 56   Temp 97.6 F (36.4 C) (Oral)   Resp 16   SpO2 100%  Physical Exam Vitals and nursing note reviewed.  Constitutional:      General: She is not in acute distress.    Appearance: Normal appearance. She is well-developed.  HENT:     Head: Normocephalic and atraumatic.  Eyes:     Conjunctiva/sclera: Conjunctivae normal.     Pupils: Pupils are equal, round, and reactive to light.  Cardiovascular:     Rate and Rhythm: Normal rate and regular rhythm.     Heart sounds: Normal heart sounds.  Pulmonary:     Effort: Pulmonary effort is normal. No respiratory distress.     Breath sounds: Normal breath sounds.  Abdominal:     General: There is no distension.     Palpations: Abdomen is soft.     Tenderness: There is no abdominal tenderness.  Musculoskeletal:        General: No deformity. Normal range of motion.     Cervical back: Normal range of motion and neck supple.     Right lower leg: Edema present.     Left lower leg: Edema present.     Comments: 2+ bilateral pitting edema to the anterior shins.  Skin:    General: Skin is warm and dry.  Neurological:     General: No focal deficit present.     Mental Status: She is alert and oriented to person, place, and time.    ED Results / Procedures / Treatments   Labs (all labs ordered are listed, but only abnormal results are displayed) Labs Reviewed  BASIC METABOLIC PANEL - Abnormal; Notable for the following components:      Result Value   Glucose, Bld 110 (*)    Calcium 8.7 (*)    All other components within normal limits  CBC - Abnormal; Notable for the following components:   RBC 3.48 (*)    Hemoglobin 10.3 (*)    HCT 33.3 (*)    All other components within normal limits  I-STAT BETA HCG BLOOD, ED ([facility], [facility], [facility] ONLY)    EKG EKG Interpretation  Date/Time:  Saturday August 18 2021 07:54:44 EDT Ventricular Rate:   47 PR Interval:  204 QRS Duration: 76 QT Interval:  410 QTC Calculation: 362 R Axis:   55 Text Interpretation: Sinus bradycardia Low voltage QRS Borderline ECG When compared with ECG of 03-Apr-2021 12:58, PREVIOUS ECG IS PRESENT Confirmed by Kristine Royal [phone]) on 08/18/2021 8:34:46 AM  Radiology DG Chest 2 View  Result Date: 08/18/2021 CLINICAL DATA:  36 year old female with shortness of breath and chest pain. EXAM: CHEST - 2 VIEW COMPARISON:  12/10/2019 FINDINGS: Cardiomegaly and mild peribronchial thickening again noted in this low volume study. There is no evidence of focal airspace disease, pulmonary edema, suspicious pulmonary nodule/mass, pleural effusion, or pneumothorax.  No acute bony abnormalities are identified. IMPRESSION: Cardiomegaly without evidence of acute cardiopulmonary disease. Electronically Signed   By: Harmon Pier M.D.   On: 08/18/2021 08:52    Procedures Procedures    Medications Ordered in ED Medications - No data to display  ED Course/ Medical Decision Making/ A&P                           Medical Decision Making Amount and/or Complexity of Data Reviewed Labs: ordered. Radiology: ordered.    Medical Screen Complete  This patient presented to the ED with complaint of bilateral lower extremity pain and swelling..  This complaint involves an extensive number of treatment options. The initial differential diagnosis includes, but is not limited to, DVT, metabolic abnormality, AKI, fluid overload, etc.  This presentation is: Acute, Chronic, Self-Limited, Previously Undiagnosed, Uncertain Prognosis, Complicated, Systemic Symptoms, and Threat to Life/Bodily Function  Patient is presenting with complaint of lower extremity pain and swelling.  This appears to be acute on chronic complaint.  Patient reports compliance with previously prescribed medications including diuretics.   Exam is not suggestive of significant acute pathology  Screening labs obtained  are without significant abnormality  Ultrasound does not demonstrate evidence of DVT.  Patient does understand need for close outpatient follow-up.   Strict return precautions given and understood.     Additional history obtained:  External records from outside sources obtained and reviewed including prior ED visits and prior Inpatient records.    Lab Tests:  I ordered and personally interpreted labs.  The pertinent results include: CBC, BMP, hCG   Imaging Studies ordered:  I ordered imaging studies including ultrasound to evaluate for DVT I independently visualized and interpreted obtained imaging which showed NAD I agree with the radiologist interpretation.  Problem List / ED Course:  Lower extremity edema   Reevaluation:  After the interventions noted above, I reevaluated the patient and found that they have: stayed the same   Disposition:  After consideration of the diagnostic results and the patients response to treatment, I feel that the patent would benefit from close outpatient follow-up.          Final Clinical Impression(s) / ED Diagnoses Final diagnoses:  Leg swelling    Rx / DC Orders ED Discharge Orders     None         Wynetta Fines, MD 08/18/21 1053

## 2021-08-18 NOTE — ED Triage Notes (Signed)
C/o pain and swelling to lower extremities and SOB on exertion.  States she had an abd [place] yesterday but didn't receive results.  Intermittent abd pain but states her main complaint is leg swelling.

## 2021-08-18 NOTE — Discharge Instructions (Signed)
Return for any problem.  ?
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
## 2021-08-21 ENCOUNTER — Ambulatory Visit: Payer: Medicaid Other | Attending: Nurse Practitioner | Admitting: Physical Therapy

## 2021-08-21 ENCOUNTER — Encounter: Payer: Self-pay | Admitting: Physical Therapy

## 2021-08-21 DIAGNOSIS — M6281 Muscle weakness (generalized): Secondary | ICD-10-CM | POA: Diagnosis present

## 2021-08-21 DIAGNOSIS — R293 Abnormal posture: Secondary | ICD-10-CM | POA: Diagnosis present

## 2021-08-21 DIAGNOSIS — M62838 Other muscle spasm: Secondary | ICD-10-CM | POA: Diagnosis present
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
## 2021-08-28 ENCOUNTER — Encounter: Payer: Self-pay | Admitting: Physical Therapy

## 2021-08-28 ENCOUNTER — Ambulatory Visit: Payer: Medicaid Other | Admitting: Physical Therapy

## 2021-08-28 DIAGNOSIS — R293 Abnormal posture: Secondary | ICD-10-CM

## 2021-08-28 DIAGNOSIS — M62838 Other muscle spasm: Secondary | ICD-10-CM

## 2021-08-28 DIAGNOSIS — M6281 Muscle weakness (generalized): Secondary | ICD-10-CM
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
## 2021-09-04 ENCOUNTER — Ambulatory Visit: Payer: Medicaid Other | Admitting: Physical Therapy

## 2021-09-04 DIAGNOSIS — R293 Abnormal posture: Secondary | ICD-10-CM

## 2021-09-04 DIAGNOSIS — M62838 Other muscle spasm: Secondary | ICD-10-CM

## 2021-09-04 DIAGNOSIS — M6281 Muscle weakness (generalized): Secondary | ICD-10-CM

## 2021-09-11 ENCOUNTER — Ambulatory Visit: Payer: Medicaid Other | Admitting: Physical Therapy

## 2021-09-11 ENCOUNTER — Encounter: Payer: Self-pay | Admitting: Physical Therapy

## 2021-09-11 DIAGNOSIS — M62838 Other muscle spasm: Secondary | ICD-10-CM

## 2021-09-11 DIAGNOSIS — M6281 Muscle weakness (generalized): Secondary | ICD-10-CM

## 2021-09-11 DIAGNOSIS — R293 Abnormal posture: Secondary | ICD-10-CM

## 2021-09-18 ENCOUNTER — Emergency Department (HOSPITAL_COMMUNITY)
Admission: EM | Admit: 2021-09-18 | Discharge: 2021-09-18 | Disposition: A | Discharge status: Home / Self Care | Payer: Medicaid Other | Attending: Emergency Medicine | Admitting: Emergency Medicine

## 2021-09-18 ENCOUNTER — Other Ambulatory Visit: Payer: Self-pay

## 2021-09-18 ENCOUNTER — Emergency Department (HOSPITAL_COMMUNITY): Payer: Medicaid Other

## 2021-09-18 ENCOUNTER — Encounter (HOSPITAL_COMMUNITY): Payer: Self-pay | Admitting: Emergency Medicine

## 2021-09-18 DIAGNOSIS — Y92000 Kitchen of unspecified non-institutional (private) residence as  the place of occurrence of the external cause: Secondary | ICD-10-CM | POA: Insufficient documentation

## 2021-09-18 DIAGNOSIS — M25552 Pain in left hip: Secondary | ICD-10-CM | POA: Insufficient documentation

## 2021-09-18 DIAGNOSIS — W01198A Fall on same level from slipping, tripping and stumbling with subsequent striking against other object, initial encounter: Secondary | ICD-10-CM | POA: Diagnosis not present

## 2021-09-18 DIAGNOSIS — S0003XA Contusion of scalp, initial encounter: Secondary | ICD-10-CM | POA: Diagnosis not present

## 2021-09-18 DIAGNOSIS — Z7984 Long term (current) use of oral hypoglycemic drugs: Secondary | ICD-10-CM | POA: Diagnosis not present

## 2021-09-18 DIAGNOSIS — T148XXA Other injury of unspecified body region, initial encounter: Secondary | ICD-10-CM

## 2021-09-18 DIAGNOSIS — Z7982 Long term (current) use of aspirin: Secondary | ICD-10-CM | POA: Diagnosis not present

## 2021-09-18 DIAGNOSIS — Z79899 Other long term (current) drug therapy: Secondary | ICD-10-CM | POA: Insufficient documentation

## 2021-09-18 DIAGNOSIS — S0990XA Unspecified injury of head, initial encounter: Secondary | ICD-10-CM | POA: Diagnosis present

## 2021-09-18 DIAGNOSIS — S00511A Abrasion of lip, initial encounter: Secondary | ICD-10-CM | POA: Diagnosis not present

## 2021-09-18 NOTE — Discharge Instructions (Signed)
Your CT imaging was negative for acute traumatic injuries. Your lip swelling and abrasion will heal and improve over the next few days.  Recommend rest, ice, NSAIDs for pain control.

## 2021-09-18 NOTE — ED Triage Notes (Signed)
Pt reported to ED for evaluation of hematoma to left forehead after "sleep walking" tonight. Pt states she takes sleeping medications and she must have accidentally walked into the wall while going to the kitchen this morning. Pt noted to fall asleep several times during triage.

## 2021-09-18 NOTE — ED Notes (Signed)
Pt not wanting to stay hooked up to the monitor. Obtained vitals and disconnect pt from monitor

## 2021-09-18 NOTE — ED Notes (Signed)
Patient verbalizes understanding of discharge instructions. Opportunity for questioning and answers were provided. Armband removed by staff, pt discharged from ED via wheelchair to lobby to return home with family.  
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
## 2021-09-18 NOTE — ED Provider Notes (Addendum)
[facility] EMERGENCY DEPARTMENT Provider Note   CSN: [id number] Arrival date & time: 09/18/21  0411     History  Chief Complaint  Patient presents with   Head Injury    Vanessa Morton is a 36 y.o. female.   Head Injury   36 year old female with medical history significant for PTSD, morbid obesity who takes Ambien 10 mg for sleep at bedtime and states that she was walking in her kitchen last night when she tripped over her son's toy and fell forward into a wall hitting her head on the wall.  She sustained a hematoma to her left forehead and fell to the ground.  She has had some aching pain in her left hip but has been ambulatory and able to range the joint.  The patient was initially somnolent on arrival but subsequently on my evaluation was GCS 15, back to her baseline mental status.  She denies any other injuries or complaints.  Home Medications Prior to Admission medications   Medication Sig Start Date End Date Taking? Authorizing Provider  Aspirin-Acetaminophen-Caffeine (GOODYS EXTRA STRENGTH) [phone] MG PACK Take 1 packet by mouth 3 (three) times daily as needed (pain).    [provider]  Buprenorphine HCl-Naloxone HCl 8-2 MG FILM Place 1 Film under the tongue in the morning, at noon, and at bedtime.    [provider]  clindamycin (CLEOCIN T) 1 % external solution Apply 1 application topically daily as needed (irritation). 02/22/21   [provider]  EPINEPHrine 0.3 mg/0.3 mL IJ SOAJ injection Inject 0.3 mg into the muscle as needed for anaphylaxis. 10/27/20   [provider]  FEROSUL 325 (65 Fe) MG tablet Take 325 mg by mouth daily. 09/07/20   [provider]  gabapentin (NEURONTIN) 400 MG capsule Take 400 mg by mouth 3 (three) times daily. 09/29/20   [provider]  hydrochlorothiazide (HYDRODIURIL) 25 MG tablet Take 25 mg by mouth every morning. 11/25/19   [provider]  ibuprofen (ADVIL) 200  MG tablet Take 800 mg by mouth every 8 (eight) hours as needed for moderate pain. 10/10/20   [provider]  ibuprofen (ADVIL) 600 MG tablet Take 1 tablet (600 mg total) by mouth every 8 (eight) hours as needed for mild pain. 08/18/21   Wynetta Fines, MD  losartan (COZAAR) 50 MG tablet Take 50 mg by mouth daily. 10/29/19   [provider]  metFORMIN (GLUCOPHAGE) 500 MG tablet Take 500 mg by mouth daily with breakfast.    [provider]  mupirocin ointment (BACTROBAN) 2 % Apply 1 application topically daily. 03/27/21   [provider]  naproxen (NAPROSYN) 500 MG tablet Take 1 tablet (500 mg total) by mouth 2 (two) times daily. 05/03/21   Raspet, Noberto Retort, PA-C  oxybutynin (DITROPAN-XL) 5 MG 24 hr tablet Take 5 mg by mouth at bedtime.    [provider]  oxyCODONE-acetaminophen (PERCOCET) 5-325 MG tablet Take 1 tablet by mouth every 4 (four) hours as needed. 04/03/21   Ocie Doyne, DMD  pantoprazole (PROTONIX) 20 MG tablet Take 1 tablet (20 mg total) by mouth daily. Patient not taking: Reported on 03/29/2021 11/30/20   Rolan Bucco, MD  Semaglutide, 1 MG/DOSE, (OZEMPIC, 1 MG/DOSE,) 4 MG/3ML SOPN Inject 1 mg into the skin every Wednesday.    [provider]  sertraline (ZOLOFT) 50 MG tablet Take 50 mg by mouth daily.    [provider]  sucralfate (CARAFATE) 1 g tablet Take 1  tablet (1 g total) by mouth 4 (four) times daily -  with meals and at bedtime. Patient not taking: Reported on 03/29/2021 11/30/20   Rolan Bucco, MD  sulfamethoxazole-trimethoprim (BACTRIM DS) 800-160 MG tablet Take 1 tablet by mouth 2 (two) times daily. Take for 7 days, started 03/27/21    [provider]  zolpidem (AMBIEN) 10 MG tablet Take 10 mg by mouth at bedtime.    [provider]      Allergies    Amoxicillin and Clindamycin    Review of Systems   Review of Systems  All other systems reviewed and are negative.   Physical Exam Updated  Vital Signs BP (!) 147/103   Pulse 90   Temp 98 F (36.7 C) (Oral)   Resp 16   SpO2 100%  Physical Exam Vitals and nursing note reviewed.  Constitutional:      General: She is not in acute distress.    Appearance: She is well-developed. She is obese.     Comments: GCS 15, ABC intact  HENT:     Head: Normocephalic.     Comments: Hematoma to left forehead    Mouth/Throat:     Comments: Abrasion and swelling to lower lip Eyes:     Extraocular Movements: Extraocular movements intact.     Conjunctiva/sclera: Conjunctivae normal.     Pupils: Pupils are equal, round, and reactive to light.  Neck:     Comments: No midline tenderness to palpation of the cervical spine.  Range of motion intact Cardiovascular:     Rate and Rhythm: Normal rate and regular rhythm.     Heart sounds: No murmur heard. Pulmonary:     Effort: Pulmonary effort is normal. No respiratory distress.     Breath sounds: Normal breath sounds.  Chest:     Comments: Clavicles stable nontender to AP compression.  Chest wall stable and nontender to AP and lateral compression. Abdominal:     Palpations: Abdomen is soft.     Tenderness: There is no abdominal tenderness.  Musculoskeletal:     Cervical back: Neck supple.     Comments: No midline tenderness to palpation of the thoracic or lumbar spine.  Extremities atraumatic with intact range of motion  Skin:    General: Skin is warm and dry.  Neurological:     Mental Status: She is alert.     Comments: Cranial nerves II through XII grossly intact.  Moving all 4 extremities spontaneously.  Sensation grossly intact all 4 extremities     ED Results / Procedures / Treatments   Labs (all labs ordered are listed, but only abnormal results are displayed) Labs Reviewed - No data to display  EKG None  Radiology CT HEAD WO CONTRAST ( )  Result Date: 09/18/2021 CLINICAL DATA:  Facial trauma. Hematoma to forehead after walking into a wall while sleepwalking EXAM: CT  HEAD WITHOUT CONTRAST CT MAXILLOFACIAL WITHOUT CONTRAST CT CERVICAL SPINE WITHOUT CONTRAST TECHNIQUE: Multidetector CT imaging of the head, cervical spine, and maxillofacial structures were performed using the standard protocol without intravenous contrast. Multiplanar CT image reconstructions of the cervical spine and maxillofacial structures were also generated. RADIATION DOSE REDUCTION: This exam was performed according to the departmental dose-optimization program which includes automated exposure control, adjustment of the mA and/or kV according to patient size and/or use of iterative reconstruction technique. COMPARISON:  03/11/20 FINDINGS: CT HEAD FINDINGS Brain: No evidence of acute infarction, hemorrhage, hydrocephalus, extra-axial collection or mass lesion/mass effect. Vascular: No hyperdense vessel or  unexpected calcification. Skull: Normal. Negative for fracture or focal lesion. Other: Left frontal scalp hematoma is identified measuring 8 mm in thickness. No underlying skull fracture. CT MAXILLOFACIAL FINDINGS Osseous: No fracture or mandibular dislocation. No destructive process. Orbits: Negative. No traumatic or inflammatory finding. Sinuses: Paranasal sinuses and the mastoid air cells are clear. No sinus fluid levels identified. Soft tissues: Left frontal scalp hematoma. CT CERVICAL SPINE FINDINGS Alignment: Normal. Skull base and vertebrae: No acute fracture. No primary bone lesion or focal pathologic process. Soft tissues and spinal canal: No prevertebral fluid or swelling. No visible canal hematoma. Disc levels: Mild multilevel endplate spurring and disc space narrowing. Upper chest: Negative. Other: None IMPRESSION: 1. No acute intracranial abnormality. 2. Left frontal scalp hematoma without underlying skull fracture. 3. No evidence for facial bone fracture. 4. No evidence for cervical spine fracture or subluxation. Electronically Signed   By: Signa Kell M.D.   On: 09/18/2021 06:02   CT  Maxillofacial Wo Contrast  Result Date: 09/18/2021 CLINICAL DATA:  Facial trauma. Hematoma to forehead after walking into a wall while sleepwalking EXAM: CT HEAD WITHOUT CONTRAST CT MAXILLOFACIAL WITHOUT CONTRAST CT CERVICAL SPINE WITHOUT CONTRAST TECHNIQUE: Multidetector CT imaging of the head, cervical spine, and maxillofacial structures were performed using the standard protocol without intravenous contrast. Multiplanar CT image reconstructions of the cervical spine and maxillofacial structures were also generated. RADIATION DOSE REDUCTION: This exam was performed according to the departmental dose-optimization program which includes automated exposure control, adjustment of the mA and/or kV according to patient size and/or use of iterative reconstruction technique. COMPARISON:  03/11/20 FINDINGS: CT HEAD FINDINGS Brain: No evidence of acute infarction, hemorrhage, hydrocephalus, extra-axial collection or mass lesion/mass effect. Vascular: No hyperdense vessel or unexpected calcification. Skull: Normal. Negative for fracture or focal lesion. Other: Left frontal scalp hematoma is identified measuring 8 mm in thickness. No underlying skull fracture. CT MAXILLOFACIAL FINDINGS Osseous: No fracture or mandibular dislocation. No destructive process. Orbits: Negative. No traumatic or inflammatory finding. Sinuses: Paranasal sinuses and the mastoid air cells are clear. No sinus fluid levels identified. Soft tissues: Left frontal scalp hematoma. CT CERVICAL SPINE FINDINGS Alignment: Normal. Skull base and vertebrae: No acute fracture. No primary bone lesion or focal pathologic process. Soft tissues and spinal canal: No prevertebral fluid or swelling. No visible canal hematoma. Disc levels: Mild multilevel endplate spurring and disc space narrowing. Upper chest: Negative. Other: None IMPRESSION: 1. No acute intracranial abnormality. 2. Left frontal scalp hematoma without underlying skull fracture. 3. No evidence for  facial bone fracture. 4. No evidence for cervical spine fracture or subluxation. Electronically Signed   By: Signa Kell M.D.   On: 09/18/2021 06:02   CT Cervical Spine Wo Contrast  Result Date: 09/18/2021 CLINICAL DATA:  Facial trauma. Hematoma to forehead after walking into a wall while sleepwalking EXAM: CT HEAD WITHOUT CONTRAST CT MAXILLOFACIAL WITHOUT CONTRAST CT CERVICAL SPINE WITHOUT CONTRAST TECHNIQUE: Multidetector CT imaging of the head, cervical spine, and maxillofacial structures were performed using the standard protocol without intravenous contrast. Multiplanar CT image reconstructions of the cervical spine and maxillofacial structures were also generated. RADIATION DOSE REDUCTION: This exam was performed according to the departmental dose-optimization program which includes automated exposure control, adjustment of the mA and/or kV according to patient size and/or use of iterative reconstruction technique. COMPARISON:  03/11/20 FINDINGS: CT HEAD FINDINGS Brain: No evidence of acute infarction, hemorrhage, hydrocephalus, extra-axial collection or mass lesion/mass effect. Vascular: No hyperdense vessel or unexpected calcification. Skull: Normal. Negative for fracture  or focal lesion. Other: Left frontal scalp hematoma is identified measuring 8 mm in thickness. No underlying skull fracture. CT MAXILLOFACIAL FINDINGS Osseous: No fracture or mandibular dislocation. No destructive process. Orbits: Negative. No traumatic or inflammatory finding. Sinuses: Paranasal sinuses and the mastoid air cells are clear. No sinus fluid levels identified. Soft tissues: Left frontal scalp hematoma. CT CERVICAL SPINE FINDINGS Alignment: Normal. Skull base and vertebrae: No acute fracture. No primary bone lesion or focal pathologic process. Soft tissues and spinal canal: No prevertebral fluid or swelling. No visible canal hematoma. Disc levels: Mild multilevel endplate spurring and disc space narrowing. Upper chest:  Negative. Other: None IMPRESSION: 1. No acute intracranial abnormality. 2. Left frontal scalp hematoma without underlying skull fracture. 3. No evidence for facial bone fracture. 4. No evidence for cervical spine fracture or subluxation. Electronically Signed   By: Signa Kell M.D.   On: 09/18/2021 06:02    Procedures Procedures    Medications Ordered in ED Medications - No data to display  ED Course/ Medical Decision Making/ A&P                           Medical Decision Making   36 year old female with medical history significant for PTSD, morbid obesity who takes Ambien 10 mg for sleep at bedtime and states that she was walking in her kitchen last night when she tripped over her son's toy and fell forward into a wall hitting her head on the wall.  She sustained a hematoma to her left forehead and fell to the ground.  She has had some aching pain in her left hip but has been ambulatory and able to range the joint.  The patient was initially somnolent on arrival but subsequently on my evaluation was GCS 15, back to her baseline mental status.  She denies any other injuries or complaints.  On arrival, the patient was vitally stable.  CT imaging of the head, cervical spine, maxillofacial was performed and negative for acute traumatic injury.  Patient has a left frontal scalp hematoma and abrasion to her lower lip.  Ambulatory in the ED without difficulty and intact range of motion with no clear evidence of traumatic injury on physical exam beyond the patient's hematoma of the forehead and lip abrasion.  Advised NSAIDs, intermittent ice for the swelling. Discussed potential for the development of a hematoma/black eye to the eyelid given the location of her left frontal hematoma.  Overall stable for discharge.   Final Clinical Impression(s) / ED Diagnoses Final diagnoses:  Injury of head, initial encounter  Abrasion of lip, initial encounter  Hematoma    Rx / DC Orders ED Discharge Orders      None         Ernie Avena, MD 09/18/21 [id number]    Ernie Avena, MD 09/18/21 [phone]
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
## 2021-09-25 ENCOUNTER — Encounter: Payer: Self-pay | Admitting: Physical Therapy

## 2021-09-25 ENCOUNTER — Ambulatory Visit: Payer: Medicaid Other | Attending: Nurse Practitioner | Admitting: Physical Therapy

## 2021-09-25 DIAGNOSIS — M6281 Muscle weakness (generalized): Secondary | ICD-10-CM | POA: Insufficient documentation

## 2021-09-25 DIAGNOSIS — R293 Abnormal posture: Secondary | ICD-10-CM | POA: Insufficient documentation

## 2021-09-25 DIAGNOSIS — M62838 Other muscle spasm: Secondary | ICD-10-CM | POA: Insufficient documentation

## 2021-10-05 ENCOUNTER — Encounter: Payer: Self-pay | Admitting: Physical Therapy

## 2021-10-05 ENCOUNTER — Ambulatory Visit: Payer: Medicaid Other | Admitting: Physical Therapy

## 2021-10-05 DIAGNOSIS — M6281 Muscle weakness (generalized): Secondary | ICD-10-CM

## 2021-10-05 DIAGNOSIS — R293 Abnormal posture: Secondary | ICD-10-CM | POA: Diagnosis not present

## 2021-10-05 DIAGNOSIS — M62838 Other muscle spasm: Secondary | ICD-10-CM
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
## 2021-10-09 ENCOUNTER — Ambulatory Visit: Payer: Medicaid Other | Admitting: Physical Therapy

## 2021-10-09 ENCOUNTER — Encounter: Payer: Self-pay | Admitting: Physical Therapy

## 2021-10-09 DIAGNOSIS — M62838 Other muscle spasm: Secondary | ICD-10-CM

## 2021-10-09 DIAGNOSIS — R293 Abnormal posture: Secondary | ICD-10-CM | POA: Diagnosis not present

## 2021-10-09 DIAGNOSIS — M6281 Muscle weakness (generalized): Secondary | ICD-10-CM

## 2021-10-23 ENCOUNTER — Ambulatory Visit: Payer: Medicaid Other | Attending: Nurse Practitioner | Admitting: Physical Therapy

## 2021-10-23 DIAGNOSIS — M62838 Other muscle spasm: Secondary | ICD-10-CM | POA: Diagnosis present

## 2021-10-23 DIAGNOSIS — R293 Abnormal posture: Secondary | ICD-10-CM | POA: Diagnosis present

## 2021-10-23 DIAGNOSIS — M6281 Muscle weakness (generalized): Secondary | ICD-10-CM | POA: Diagnosis present
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
## 2021-10-29 ENCOUNTER — Ambulatory Visit: Payer: Medicaid Other | Admitting: Physical Therapy

## 2021-11-06 ENCOUNTER — Encounter: Payer: Self-pay | Admitting: Physical Therapy

## 2021-11-06 ENCOUNTER — Ambulatory Visit: Payer: Medicaid Other | Admitting: Physical Therapy

## 2021-11-06 DIAGNOSIS — M62838 Other muscle spasm: Secondary | ICD-10-CM

## 2021-11-06 DIAGNOSIS — M6281 Muscle weakness (generalized): Secondary | ICD-10-CM

## 2021-11-06 DIAGNOSIS — R293 Abnormal posture: Secondary | ICD-10-CM | POA: Diagnosis not present
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
## 2021-11-13 ENCOUNTER — Ambulatory Visit: Payer: Medicaid Other | Admitting: Physical Therapy
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
## 2021-11-16 ENCOUNTER — Ambulatory Visit: Payer: Medicaid Other | Admitting: Physical Therapy
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
## 2021-11-20 ENCOUNTER — Ambulatory Visit: Payer: Medicaid Other | Admitting: Physical Therapy
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
## 2021-11-21 ENCOUNTER — Telehealth: Payer: Self-pay | Admitting: Physical Therapy

## 2021-11-21 NOTE — Telephone Encounter (Signed)
Pt was called to schedule appointment as well as to remind her of no show/cancellation policy  [employer], PT 11/21/21 12:14 PM

## 2021-11-30 ENCOUNTER — Encounter: Payer: Self-pay | Admitting: Physical Therapy

## 2021-11-30 ENCOUNTER — Ambulatory Visit: Payer: Medicaid Other | Attending: Nurse Practitioner | Admitting: Physical Therapy

## 2021-11-30 DIAGNOSIS — R293 Abnormal posture: Secondary | ICD-10-CM | POA: Diagnosis present

## 2021-11-30 DIAGNOSIS — M62838 Other muscle spasm: Secondary | ICD-10-CM | POA: Insufficient documentation

## 2021-11-30 DIAGNOSIS — M6281 Muscle weakness (generalized): Secondary | ICD-10-CM | POA: Diagnosis present
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
## 2021-12-03 ENCOUNTER — Ambulatory Visit: Payer: Medicaid Other | Admitting: Physical Therapy

## 2021-12-03 DIAGNOSIS — M6281 Muscle weakness (generalized): Secondary | ICD-10-CM

## 2021-12-03 DIAGNOSIS — R293 Abnormal posture: Secondary | ICD-10-CM

## 2021-12-03 DIAGNOSIS — M62838 Other muscle spasm: Secondary | ICD-10-CM
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
## 2022-02-15 HISTORY — PX: GASTRIC BYPASS: SHX52

## 2022-03-16 ENCOUNTER — Emergency Department (HOSPITAL_COMMUNITY): Payer: Medicaid Other

## 2022-03-16 ENCOUNTER — Encounter (HOSPITAL_COMMUNITY): Payer: Self-pay

## 2022-03-16 ENCOUNTER — Other Ambulatory Visit: Payer: Self-pay

## 2022-03-16 ENCOUNTER — Emergency Department (HOSPITAL_COMMUNITY)
Admission: EM | Admit: 2022-03-16 | Discharge: 2022-03-17 | Disposition: A | Discharge status: Home / Self Care | Payer: Medicaid Other | Attending: Emergency Medicine | Admitting: Emergency Medicine

## 2022-03-16 DIAGNOSIS — K439 Ventral hernia without obstruction or gangrene: Secondary | ICD-10-CM | POA: Diagnosis not present

## 2022-03-16 DIAGNOSIS — N9489 Other specified conditions associated with female genital organs and menstrual cycle: Secondary | ICD-10-CM | POA: Insufficient documentation

## 2022-03-16 DIAGNOSIS — R109 Unspecified abdominal pain: Secondary | ICD-10-CM | POA: Diagnosis present

## 2022-03-16 DIAGNOSIS — G8918 Other acute postprocedural pain: Secondary | ICD-10-CM | POA: Insufficient documentation

## 2022-03-16 LAB — COMPREHENSIVE METABOLIC PANEL
ALT: 34 U/L (ref 0–44)
AST: 26 U/L (ref 15–41)
Albumin: 3.9 g/dL (ref 3.5–5.0)
Alkaline Phosphatase: 44 U/L (ref 38–126)
Anion gap: 9 (ref 5–15)
BUN: 12 mg/dL (ref 6–20)
CO2: 27 mmol/L (ref 22–32)
Calcium: 9.2 mg/dL (ref 8.9–10.3)
Chloride: 100 mmol/L (ref 98–111)
Creatinine, Ser: 0.71 mg/dL (ref 0.44–1.00)
GFR, Estimated: >60 mL/min (ref >60)
Glucose, Bld: 86 mg/dL (ref 70–99)
Potassium: 4.2 mmol/L (ref 3.5–5.1)
Sodium: 136 mmol/L (ref 135–145)
Total Bilirubin: 0.4 mg/dL (ref 0.3–1.2)
Total Protein: 7.7 g/dL (ref 6.5–8.1)

## 2022-03-16 LAB — CBC WITH DIFFERENTIAL/PLATELET
Abs Immature Granulocytes: 0.02 10*3/uL (ref 0.00–0.07)
Basophils Absolute: 0 10*3/uL (ref 0.0–0.1)
Basophils Relative: 1 %
Eosinophils Absolute: 0.4 10*3/uL (ref 0.0–0.5)
Eosinophils Relative: 5 %
HCT: 36.5 % (ref 36.0–46.0)
Hemoglobin: 10.9 g/dL — ABNORMAL LOW (ref 12.0–15.0)
Immature Granulocytes: 0 %
Lymphocytes Relative: 34 %
Lymphs Abs: 2.5 10*3/uL (ref 0.7–4.0)
MCH: 29.5 pg (ref 26.0–34.0)
MCHC: 29.9 g/dL — ABNORMAL LOW (ref 30.0–36.0)
MCV: 98.6 fL (ref 80.0–100.0)
Monocytes Absolute: 0.6 10*3/uL (ref 0.1–1.0)
Monocytes Relative: 9 %
Neutro Abs: 3.7 10*3/uL (ref 1.7–7.7)
Neutrophils Relative %: 51 %
Platelets: 307 10*3/uL (ref 150–400)
RBC: 3.7 MIL/uL — ABNORMAL LOW (ref 3.87–5.11)
RDW: 12.3 % (ref 11.5–15.5)
WBC: 7.3 10*3/uL (ref 4.0–10.5)
nRBC: 0 % (ref 0.0–0.2)

## 2022-03-16 LAB — URINALYSIS, ROUTINE W REFLEX MICROSCOPIC
Bilirubin Urine: NEGATIVE
Glucose, UA: NEGATIVE mg/dL
Ketones, ur: NEGATIVE mg/dL
Nitrite: NEGATIVE
Protein, ur: 30 mg/dL — AB
RBC / HPF: >50 RBC/hpf — ABNORMAL HIGH (ref 0–5)
Specific Gravity, Urine: 1.034 — ABNORMAL HIGH (ref 1.005–1.030)
pH: 6 (ref 5.0–8.0)

## 2022-03-16 LAB — I-STAT BETA HCG BLOOD, ED (MC, WL, AP ONLY): I-stat hCG, quantitative: <5 m[IU]/mL (ref <5)

## 2022-03-16 LAB — LIPASE, BLOOD: Lipase: 35 U/L (ref 11–51)

## 2022-03-16 NOTE — ED Triage Notes (Signed)
Pt arrived to triage complaining of abdominal pain after having a gastric sleeve placed 12/20. Pt states that her abdomen is distended and she has noticed painful knots around surgical sites.   Pt states that she has also has several chills.

## 2022-03-16 NOTE — ED Provider Notes (Signed)
[facility]ENC[facility] DEPT [facility] Emergency Department Provider Note MRN:  [id number]  Arrival date & time: 03/17/22     Chief Complaint   Post-op Problem   History of Present Illness   Vanessa Morton is a 36 y.o. year-old female presents to the ED with chief complaint of abdominal pain.  Reports associated abdominal distension.  Denies fevers, nausea, vomiting, diarrhea, or constipation.  Had Gastric Sleeve on 12/20 at [facility]. States that she is starting to feel SOB and thinks it's from the distension in her abdomen.  History provided by patient.   Review of Systems  Pertinent positive and negative review of systems noted in HPI.    Physical Exam   Vitals:   03/16/22 2059 03/17/22 0156  BP: 120/83 (!) 135/90  Pulse: 62 68  Resp: (!) 25 19  Temp: 98.3 F (36.8 C) 97.6 F (36.4 C)  SpO2: 98% 100%    CONSTITUTIONAL:  well-appearing, NAD NEURO:  Alert and oriented x 3, CN 3-12 grossly intact EYES:  eyes equal and reactive ENT/NECK:  Supple, no stridor  CARDIO:  normal rate, regular rhythm, appears well-perfused  PULM:  No respiratory distress,  GI/GU:  Moderately distended, diffuse discomfort without focal tenderness MSK/SPINE:  No gross deformities, no edema, moves all extremities  SKIN:  no rash, atraumatic   *Additional and/or pertinent findings included in MDM below  Diagnostic and Interventional Summary    EKG Interpretation  Date/Time:    Ventricular Rate:    PR Interval:    QRS Duration:   QT Interval:    QTC Calculation:   R Axis:     Text Interpretation:         Labs Reviewed  CBC WITH DIFFERENTIAL/PLATELET - Abnormal; Notable for the following components:      Result Value   RBC 3.70 (*)    Hemoglobin 10.9 (*)    MCHC 29.9 (*)    All other components within normal limits  URINALYSIS, ROUTINE W REFLEX MICROSCOPIC - Abnormal; Notable for the following components:   Specific Gravity, Urine 1.034 (*)    Hgb urine dipstick MODERATE (*)     Protein, ur 30 (*)    Leukocytes,Ua TRACE (*)    RBC / HPF >50 (*)    Bacteria, UA RARE (*)    All other components within normal limits  COMPREHENSIVE METABOLIC PANEL  LIPASE, BLOOD  I-STAT BETA HCG BLOOD, ED ([facility], [facility], [facility] ONLY)    CT ABDOMEN PELVIS W CONTRAST  Final Result      Medications  iohexol (OMNIPAQUE) 350 MG/ML injection 75 mL (75 mLs Intravenous Contrast Given 03/17/22 0016)  oxyCODONE-acetaminophen (PERCOCET/ROXICET) 5-325 MG per tablet 1 tablet (1 tablet Oral Given 03/17/22 0119)     Procedures  /  Critical Care Procedures  ED Course and Medical Decision Making  I have reviewed the triage vital signs, the nursing notes, and pertinent available records from the EMR.  Social Determinants Affecting Complexity of Care: Patient has no clinically significant social determinants affecting this chief complaint..   ED Course:    Medical Decision Making Risk Prescription drug management.     Consultants: I discussed the case with Dr. Sheliah Hatch, who has reviewed the CT findings and states no emergent intervention.  Can follow-up with her surgeon.   Treatment and Plan: Emergency department workup does not suggest an emergent condition requiring admission or immediate intervention beyond  what has been performed at this time. The patient is safe for discharge and has  been  instructed to return immediately for worsening symptoms, change in  symptoms or any other concerns    Final Clinical Impressions(s) / ED Diagnoses     ICD-10-CM   1. Post-operative pain  G89.18     2. Ventral hernia without obstruction or gangrene  K43.9    fat containing hernia      ED Discharge Orders          Ordered    oxyCODONE-acetaminophen (PERCOCET) 5-325 MG tablet  Every 6 hours PRN        03/17/22 0141              Discharge Instructions Discussed with and Provided to Patient:     Discharge Instructions      You need to follow-up with your surgeon.  Call them for  an appointment ASAP.  I've printed your CT report that you can take with you.  Keep taking your stool softeners.           Roxy Horseman, PA-C 03/17/22 2207    Virgina Norfolk, DO 03/18/22 2103

## 2022-03-16 NOTE — ED Provider Triage Note (Signed)
Emergency Medicine Provider Triage Evaluation Note  Vanessa Morton , a 36 y.o. female  was evaluated in triage.  Pt complains of abdominal pain since her gastric sleeve placed on 12-20.  She reports that her abdominal pain has been worse the past few days and she feels that her belly is becoming distended.  Denies any fevers, nausea, vomiting, diarrhea, or constipation.  She reports that she has had some chills however.  Review of Systems  Positive:  Negative:   Physical Exam  BP 120/83 (BP Location: Left Arm)   Pulse 62   Temp 98.3 F (36.8 C) (Oral)   Resp (!) 25   Ht 5\' 1"  (1.549 m)   Wt 125.6 kg   SpO2 98%   BMI 52.34 kg/m  Gen:   Awake, no distress   Resp:  Normal effort  MSK:   Moves extremities without difficulty  Other:  Morbidly obese abdomen that has diffuse tenderness and does appear to be distended. No guarding.   Medical Decision Making  Medically screening exam initiated at 9:20 PM.  Appropriate orders placed.  Vanessa Morton was informed that the remainder of the evaluation will be completed by another provider, this initial triage assessment does not replace that evaluation, and the importance of remaining in the ED until their evaluation is complete.  Ct abd and labs ordered.    Sela Hilding, Achille Rich 03/16/22 2122

## 2022-03-17 ENCOUNTER — Emergency Department (HOSPITAL_COMMUNITY): Payer: Medicaid Other

## 2022-03-17 MED ORDER — OXYCODONE-ACETAMINOPHEN 5-325 MG PO TABS
1.0000 | ORAL_TABLET | Freq: Once | ORAL | Status: AC
  Administered 2022-03-17: 1 via ORAL
  Filled 2022-03-17: qty 1

## 2022-03-17 MED ORDER — IOHEXOL 350 MG/ML SOLN
75.0000 mL | Freq: Once | INTRAVENOUS | Status: AC | PRN
  Administered 2022-03-17: 75 mL via INTRAVENOUS

## 2022-03-17 MED ORDER — OXYCODONE-ACETAMINOPHEN 5-325 MG PO TABS: 1.0000 | ORAL_TABLET | Freq: Four times a day (QID) | ORAL | 0 refills | Status: DC | PRN

## 2022-03-17 NOTE — Discharge Instructions (Signed)
You need to follow-up with your surgeon.  Call them for an appointment ASAP.  I've printed your CT report that you can take with you.  Keep taking your stool softeners.

## 2022-03-17 NOTE — ED Notes (Signed)
Provided sandwich and beverage to patient to PO challenge

## 2022-05-21 ENCOUNTER — Encounter (HOSPITAL_COMMUNITY): Payer: Self-pay | Admitting: Emergency Medicine

## 2022-05-21 ENCOUNTER — Emergency Department (HOSPITAL_COMMUNITY)
Admission: EM | Admit: 2022-05-21 | Discharge: 2022-05-21 | Disposition: A | Discharge status: Home / Self Care | Payer: Medicaid Other | Attending: Emergency Medicine | Admitting: Emergency Medicine

## 2022-05-21 ENCOUNTER — Other Ambulatory Visit: Payer: Self-pay

## 2022-05-21 DIAGNOSIS — S0083XA Contusion of other part of head, initial encounter: Secondary | ICD-10-CM | POA: Diagnosis not present

## 2022-05-21 DIAGNOSIS — Z903 Acquired absence of stomach [part of]: Secondary | ICD-10-CM

## 2022-05-21 DIAGNOSIS — I10 Essential (primary) hypertension: Secondary | ICD-10-CM | POA: Insufficient documentation

## 2022-05-21 DIAGNOSIS — R112 Nausea with vomiting, unspecified: Secondary | ICD-10-CM

## 2022-05-21 DIAGNOSIS — X58XXXA Exposure to other specified factors, initial encounter: Secondary | ICD-10-CM | POA: Insufficient documentation

## 2022-05-21 DIAGNOSIS — E860 Dehydration: Secondary | ICD-10-CM | POA: Diagnosis not present

## 2022-05-21 DIAGNOSIS — H02842 Edema of right lower eyelid: Secondary | ICD-10-CM | POA: Insufficient documentation

## 2022-05-21 DIAGNOSIS — Z79899 Other long term (current) drug therapy: Secondary | ICD-10-CM | POA: Insufficient documentation

## 2022-05-21 DIAGNOSIS — Z9884 Bariatric surgery status: Secondary | ICD-10-CM | POA: Diagnosis not present

## 2022-05-21 DIAGNOSIS — H02841 Edema of right upper eyelid: Secondary | ICD-10-CM | POA: Insufficient documentation

## 2022-05-21 LAB — COMPREHENSIVE METABOLIC PANEL
ALT: 18 U/L (ref 0–44)
AST: 25 U/L (ref 15–41)
Albumin: 3.6 g/dL (ref 3.5–5.0)
Alkaline Phosphatase: 47 U/L (ref 38–126)
Anion gap: 7 (ref 5–15)
BUN: 9 mg/dL (ref 6–20)
CO2: 24 mmol/L (ref 22–32)
Calcium: 8.5 mg/dL — ABNORMAL LOW (ref 8.9–10.3)
Chloride: 105 mmol/L (ref 98–111)
Creatinine, Ser: 0.6 mg/dL (ref 0.44–1.00)
GFR, Estimated: >60 mL/min (ref >60)
Glucose, Bld: 94 mg/dL (ref 70–99)
Potassium: 3.9 mmol/L (ref 3.5–5.1)
Sodium: 136 mmol/L (ref 135–145)
Total Bilirubin: 0.6 mg/dL (ref 0.3–1.2)
Total Protein: 7 g/dL (ref 6.5–8.1)

## 2022-05-21 LAB — URINALYSIS, ROUTINE W REFLEX MICROSCOPIC
Bilirubin Urine: NEGATIVE
Glucose, UA: NEGATIVE mg/dL
Ketones, ur: NEGATIVE mg/dL
Leukocytes,Ua: NEGATIVE
Nitrite: NEGATIVE
Protein, ur: NEGATIVE mg/dL
Specific Gravity, Urine: 1.016 (ref 1.005–1.030)
pH: 7 (ref 5.0–8.0)

## 2022-05-21 LAB — CBC WITH DIFFERENTIAL/PLATELET
Abs Immature Granulocytes: 0.01 10*3/uL (ref 0.00–0.07)
Basophils Absolute: 0 10*3/uL (ref 0.0–0.1)
Basophils Relative: 0 %
Eosinophils Absolute: 0.2 10*3/uL (ref 0.0–0.5)
Eosinophils Relative: 3 %
HCT: 35.4 % — ABNORMAL LOW (ref 36.0–46.0)
Hemoglobin: 11 g/dL — ABNORMAL LOW (ref 12.0–15.0)
Immature Granulocytes: 0 %
Lymphocytes Relative: 31 %
Lymphs Abs: 1.7 10*3/uL (ref 0.7–4.0)
MCH: 30.1 pg (ref 26.0–34.0)
MCHC: 31.1 g/dL (ref 30.0–36.0)
MCV: 96.7 fL (ref 80.0–100.0)
Monocytes Absolute: 0.4 10*3/uL (ref 0.1–1.0)
Monocytes Relative: 7 %
Neutro Abs: 3.1 10*3/uL (ref 1.7–7.7)
Neutrophils Relative %: 59 %
Platelets: 236 10*3/uL (ref 150–400)
RBC: 3.66 MIL/uL — ABNORMAL LOW (ref 3.87–5.11)
RDW: 12.6 % (ref 11.5–15.5)
WBC: 5.3 10*3/uL (ref 4.0–10.5)
nRBC: 0 % (ref 0.0–0.2)

## 2022-05-21 LAB — PREGNANCY, URINE: Preg Test, Ur: NEGATIVE

## 2022-05-21 LAB — LIPASE, BLOOD: Lipase: 34 U/L (ref 11–51)

## 2022-05-21 MED ORDER — ONDANSETRON HCL 4 MG/2ML IJ SOLN
4.0000 mg | Freq: Once | INTRAMUSCULAR | Status: AC
  Administered 2022-05-21: 4 mg via INTRAVENOUS
  Filled 2022-05-21: qty 2

## 2022-05-21 MED ORDER — FLUORESCEIN SODIUM 1 MG OP STRP
1.0000 | ORAL_STRIP | Freq: Once | OPHTHALMIC | Status: AC
  Administered 2022-05-21: 1 via OPHTHALMIC
  Filled 2022-05-21: qty 1

## 2022-05-21 MED ORDER — SODIUM CHLORIDE 0.9 % IV BOLUS
1000.0000 mL | Freq: Once | INTRAVENOUS | Status: AC
  Administered 2022-05-21: 1000 mL via INTRAVENOUS

## 2022-05-21 MED ORDER — SCOPOLAMINE 1 MG/3DAYS TD PT72: 1.0000 | MEDICATED_PATCH | TRANSDERMAL | 12 refills | Status: DC

## 2022-05-21 MED ORDER — TETRACAINE HCL 0.5 % OP SOLN
2.0000 [drp] | Freq: Once | OPHTHALMIC | Status: AC
  Administered 2022-05-21: 2 [drp] via OPHTHALMIC
  Filled 2022-05-21: qty 4

## 2022-05-21 NOTE — ED Provider Notes (Signed)
[facility] Provider Note   CSN: [id number] Arrival date & time: 05/21/22  0815     History  Chief Complaint  Patient presents with   Emesis   Diarrhea   Facial Swelling    Vanessa Morton is a 37 y.o. female.  Pt is a 37 yo female with pmhx significant for htn, obesity and anxiety.  She is s/p gastric sleeve surgery on 12/20.  She has gained weight since surgery and has had problems with pain and nausea.  She has had multiple visits to her surgeon.  Last visit was on 2/19.  Plan was for upper GI studies.  She has not yet seen GI.  Pt has developed a small ventral hernia without incarceration.  Her surgeons are following it. For the last 3 days, she's had n/v/d.  She did accidentally hit her forehead on the wall after going to the bathroom at night a few days ago.  Today, she woke up with right upper and lower eyelid swelling.  She was concerned it was from a tooth problem.            Home Medications Prior to Admission medications   Medication Sig Start Date End Date Taking? Authorizing Provider  scopolamine (TRANSDERM-SCOP) 1 MG/3DAYS Place 1 patch (1.5 mg total) onto the skin every 3 (three) days. 05/21/22  Yes Isla Pence, MD  Aspirin-Acetaminophen-Caffeine (GOODYS EXTRA STRENGTH) [phone] MG PACK Take 1 packet by mouth 3 (three) times daily as needed (pain).    [provider]  Buprenorphine HCl-Naloxone HCl 8-2 MG FILM Place 1 Film under the tongue in the morning, at noon, and at bedtime.    [provider]  clindamycin (CLEOCIN T) 1 % external solution Apply 1 application topically daily as needed (irritation). 02/22/21   [provider]  EPINEPHrine 0.3 mg/0.3 mL IJ SOAJ injection Inject 0.3 mg into the muscle as needed for anaphylaxis. 10/27/20   [provider]  FEROSUL 325 (65 Fe) MG tablet Take 325 mg by mouth daily. 09/07/20   [provider]  gabapentin (NEURONTIN) 400 MG  capsule Take 400 mg by mouth 3 (three) times daily. 09/29/20   [provider]  hydrochlorothiazide (HYDRODIURIL) 25 MG tablet Take 25 mg by mouth every morning. 11/25/19   [provider]  ibuprofen (ADVIL) 200 MG tablet Take 800 mg by mouth every 8 (eight) hours as needed for moderate pain. 10/10/20   [provider]  ibuprofen (ADVIL) 600 MG tablet Take 1 tablet (600 mg total) by mouth every 8 (eight) hours as needed for mild pain. 08/18/21   Valarie Merino, MD  losartan (COZAAR) 50 MG tablet Take 50 mg by mouth daily. 10/29/19   [provider]  metFORMIN (GLUCOPHAGE) 500 MG tablet Take 500 mg by mouth daily with breakfast.    [provider]  mupirocin ointment (BACTROBAN) 2 % Apply 1 application topically daily. 03/27/21   [provider]  naproxen (NAPROSYN) 500 MG tablet Take 1 tablet (500 mg total) by mouth 2 (two) times daily. 05/03/21   Raspet, Derry Skill, PA-C  oxybutynin (DITROPAN-XL) 5 MG 24 hr tablet Take 5 mg by mouth at bedtime.    [provider]  oxyCODONE-acetaminophen (PERCOCET) 5-325 MG tablet Take 1 tablet by mouth every 4 (four) hours as needed. 04/03/21   Diona Browner, DMD  oxyCODONE-acetaminophen (PERCOCET) 5-325 MG tablet Take 1-2 tablets by mouth every 6 (six) hours as needed. 03/17/22  Montine Circle, PA-C  pantoprazole (PROTONIX) 20 MG tablet Take 1 tablet (20 mg total) by mouth daily. Patient not taking: Reported on 03/29/2021 11/30/20   Malvin Johns, MD  Semaglutide, 1 MG/DOSE, (OZEMPIC, 1 MG/DOSE,) 4 MG/3ML SOPN Inject 1 mg into the skin every Wednesday.    [provider]  sertraline (ZOLOFT) 50 MG tablet Take 50 mg by mouth daily.    [provider]  sucralfate (CARAFATE) 1 g tablet Take 1 tablet (1 g total) by mouth 4 (four) times daily -  with meals and at bedtime. Patient not taking: Reported on 03/29/2021 11/30/20   Malvin Johns, MD  sulfamethoxazole-trimethoprim (BACTRIM DS) 800-160 MG  tablet Take 1 tablet by mouth 2 (two) times daily. Take for 7 days, started 03/27/21    [provider]  zolpidem (AMBIEN) 10 MG tablet Take 10 mg by mouth at bedtime.    [provider]      Allergies    Amoxicillin and Clindamycin    Review of Systems   Review of Systems  Gastrointestinal:  Positive for abdominal pain, diarrhea, nausea and vomiting.  All other systems reviewed and are negative.   Physical Exam Updated Vital Signs BP (!) 170/108 (BP Location: Right Arm)   Pulse 79   Temp 98.6 F (37 C) (Oral)   Resp 13   Ht '5\' 1"'$  (1.549 m)   Wt 124.7 kg   SpO2 100%   BMI 51.96 kg/m  Physical Exam Vitals and nursing note reviewed.  Constitutional:      Appearance: Normal appearance. She is obese.  HENT:     Head: Normocephalic and atraumatic.     Comments: Small forehead hematoma    Right Ear: Tympanic membrane, ear canal and external ear normal.     Left Ear: Tympanic membrane, ear canal and external ear normal.     Nose: Nose normal.     Mouth/Throat:     Mouth: Mucous membranes are dry.  Eyes:     Extraocular Movements: Extraocular movements intact.     Conjunctiva/sclera: Conjunctivae normal.     Pupils: Pupils are equal, round, and reactive to light.     Right eye: No corneal abrasion or fluorescein uptake.     Comments: Right upper and lower eyelid swelling.  Mild bruising c/w "black eye."  Cardiovascular:     Rate and Rhythm: Normal rate and regular rhythm.     Pulses: Normal pulses.     Heart sounds: Normal heart sounds.  Pulmonary:     Effort: Pulmonary effort is normal.     Breath sounds: Normal breath sounds.  Abdominal:     General: Abdomen is flat. Bowel sounds are normal.     Palpations: Abdomen is soft.  Musculoskeletal:        General: Normal range of motion.     Cervical back: Normal range of motion and neck supple.  Skin:    General: Skin is warm.     Capillary Refill: Capillary refill takes less than 2 seconds.   Neurological:     General: No focal deficit present.     Mental Status: She is alert and oriented to person, place, and time.  Psychiatric:        Mood and Affect: Mood normal.        Behavior: Behavior normal.     ED Results / Procedures / Treatments   Labs (all labs ordered are listed, but only abnormal results are displayed) Labs Reviewed  CBC WITH DIFFERENTIAL/PLATELET -  Abnormal; Notable for the following components:      Result Value   RBC 3.66 (*)    Hemoglobin 11.0 (*)    HCT 35.4 (*)    All other components within normal limits  COMPREHENSIVE METABOLIC PANEL - Abnormal; Notable for the following components:   Calcium 8.5 (*)    All other components within normal limits  URINALYSIS, ROUTINE W REFLEX MICROSCOPIC - Abnormal; Notable for the following components:   APPearance HAZY (*)    Hgb urine dipstick SMALL (*)    Bacteria, UA RARE (*)    All other components within normal limits  LIPASE, BLOOD  PREGNANCY, URINE    EKG EKG Interpretation  Date/Time:  Tuesday May 21 2022 08:35:51 EST Ventricular Rate:  76 PR Interval:  188 QRS Duration: 89 QT Interval:  383 QTC Calculation: 431 R Axis:   32 Text Interpretation: Sinus rhythm Low voltage, precordial leads Borderline T abnormalities, anterior leads No significant change since last tracing Confirmed by Isla Pence [phone]) on 05/21/2022 8:57:54 AM  Radiology No results found.  Procedures Procedures    Medications Ordered in ED Medications  fluorescein ophthalmic strip 1 strip (has no administration in time range)  tetracaine (PONTOCAINE) 0.5 % ophthalmic solution 2 drop (has no administration in time range)  sodium chloride 0.9 % bolus 1,000 mL (1,000 mLs Intravenous New Bag/Given 05/21/22 0903)  ondansetron (ZOFRAN) injection 4 mg (4 mg Intravenous Given 05/21/22 [id number])    ED Course/ Medical Decision Making/ A&P                             Medical Decision Making Amount and/or Complexity of Data  Reviewed Labs: ordered.  Risk Prescription drug management.   This patient presents to the ED for concern of n/v/d, this involves an extensive number of treatment options, and is a complaint that carries with it a high risk of complications and morbidity.  The differential diagnosis includes electrolyte abn, infection, pregnancy, gastric sleeve problem   Co morbidities that complicate the patient evaluation  htn, obesity and anxiety   Additional history obtained:  Additional history obtained from epic chart review   Lab Tests:  I Ordered, and personally interpreted labs.  The pertinent results include:  preg neg, ua neg, cbc with hgb sl low at 11 (chronic), cmp nl  Cardiac Monitoring:  The patient was maintained on a cardiac monitor.  I personally viewed and interpreted the cardiac monitored which showed an underlying rhythm of: nsr   Medicines ordered and prescription drug management:  I ordered medication including zofran and ivfs  for n/v  Reevaluation of the patient after these medicines showed that the patient improved I have reviewed the patients home medicines and have made adjustments as needed   Test Considered:  Ct, but afebrile and nl wbc   Critical Interventions:  ivfs   Problem List / ED Course:  N/v:  pt is feeling much better after meds and fluids.  She is able to tolerate po challenge.  She requested a rx for the scopolamine patch for nausea.  I have prescribed this for her.  I have encouraged her to eat the diet as prescribed by her bariatric surgeon. HTN:  pt has a hx of htn, but said she was taken off her bp meds in December.  She is hypertensive here.  She said it is ok when it is checked at the doctor's office.  It was 117/71 at  visit on 2/19.  I told her to f/u with pcp to have it rechecked.   Reevaluation:  After the interventions noted above, I reevaluated the patient and found that they have :improved   Social Determinants of  Health:  Lives at home   Dispostion:  After consideration of the diagnostic results and the patients response to treatment, I feel that the patent would benefit from discharge with outpatient f/u.          Final Clinical Impression(s) / ED Diagnoses Final diagnoses:  Dehydration  Nausea and vomiting, unspecified vomiting type  S/P gastric sleeve procedure    Rx / DC Orders ED Discharge Orders          Ordered    scopolamine (TRANSDERM-SCOP) 1 MG/3DAYS  every 72 hours        05/21/22 1045              Isla Pence, MD 05/21/22 1050

## 2022-05-21 NOTE — ED Notes (Signed)
Not able to complete visual acuity as pt normally wears glasses, is unable to see well without them, and left them at home.

## 2022-05-21 NOTE — ED Triage Notes (Signed)
Patient arrives ambulatory by POV c/o N/V/D and facial swelling over the past few days. Patient reports gastric sleeve surgery in December. Currently throwing up in triage. Concerned for dental issue as well.

## 2022-05-27 ENCOUNTER — Emergency Department (HOSPITAL_COMMUNITY)
Admission: EM | Admit: 2022-05-27 | Discharge: 2022-05-27 | Disposition: A | Discharge status: Home / Self Care | Payer: Medicaid Other | Attending: Emergency Medicine | Admitting: Emergency Medicine

## 2022-05-27 ENCOUNTER — Encounter (HOSPITAL_COMMUNITY): Payer: Self-pay

## 2022-05-27 ENCOUNTER — Other Ambulatory Visit: Payer: Self-pay

## 2022-05-27 DIAGNOSIS — Z7982 Long term (current) use of aspirin: Secondary | ICD-10-CM | POA: Insufficient documentation

## 2022-05-27 DIAGNOSIS — Z79899 Other long term (current) drug therapy: Secondary | ICD-10-CM | POA: Diagnosis not present

## 2022-05-27 DIAGNOSIS — Z79891 Long term (current) use of opiate analgesic: Secondary | ICD-10-CM | POA: Diagnosis not present

## 2022-05-27 DIAGNOSIS — F1123 Opioid dependence with withdrawal: Secondary | ICD-10-CM | POA: Insufficient documentation

## 2022-05-27 DIAGNOSIS — F1193 Opioid use, unspecified with withdrawal: Secondary | ICD-10-CM

## 2022-05-27 DIAGNOSIS — I10 Essential (primary) hypertension: Secondary | ICD-10-CM | POA: Insufficient documentation

## 2022-05-27 LAB — URINALYSIS, ROUTINE W REFLEX MICROSCOPIC
Bilirubin Urine: NEGATIVE
Glucose, UA: NEGATIVE mg/dL
Hgb urine dipstick: NEGATIVE
Ketones, ur: NEGATIVE mg/dL
Leukocytes,Ua: NEGATIVE
Nitrite: NEGATIVE
Protein, ur: NEGATIVE mg/dL
Specific Gravity, Urine: 1.034 — ABNORMAL HIGH (ref 1.005–1.030)
pH: 5 (ref 5.0–8.0)

## 2022-05-27 LAB — COMPREHENSIVE METABOLIC PANEL
ALT: 15 U/L (ref 0–44)
AST: 19 U/L (ref 15–41)
Albumin: 4.2 g/dL (ref 3.5–5.0)
Alkaline Phosphatase: 54 U/L (ref 38–126)
Anion gap: 6 (ref 5–15)
BUN: 10 mg/dL (ref 6–20)
CO2: 22 mmol/L (ref 22–32)
Calcium: 9 mg/dL (ref 8.9–10.3)
Chloride: 108 mmol/L (ref 98–111)
Creatinine, Ser: 0.6 mg/dL (ref 0.44–1.00)
GFR, Estimated: >60 mL/min (ref >60)
Glucose, Bld: 108 mg/dL — ABNORMAL HIGH (ref 70–99)
Potassium: 3.8 mmol/L (ref 3.5–5.1)
Sodium: 136 mmol/L (ref 135–145)
Total Bilirubin: 0.6 mg/dL (ref 0.3–1.2)
Total Protein: 8.3 g/dL — ABNORMAL HIGH (ref 6.5–8.1)

## 2022-05-27 LAB — CBC WITH DIFFERENTIAL/PLATELET
Abs Immature Granulocytes: 0.03 10*3/uL (ref 0.00–0.07)
Basophils Absolute: 0 10*3/uL (ref 0.0–0.1)
Basophils Relative: 1 %
Eosinophils Absolute: 0.1 10*3/uL (ref 0.0–0.5)
Eosinophils Relative: 1 %
HCT: 40.5 % (ref 36.0–46.0)
Hemoglobin: 12.6 g/dL (ref 12.0–15.0)
Immature Granulocytes: 0 %
Lymphocytes Relative: 24 %
Lymphs Abs: 2 10*3/uL (ref 0.7–4.0)
MCH: 29.7 pg (ref 26.0–34.0)
MCHC: 31.1 g/dL (ref 30.0–36.0)
MCV: 95.5 fL (ref 80.0–100.0)
Monocytes Absolute: 0.5 10*3/uL (ref 0.1–1.0)
Monocytes Relative: 5 %
Neutro Abs: 5.8 10*3/uL (ref 1.7–7.7)
Neutrophils Relative %: 69 %
Platelets: 300 10*3/uL (ref 150–400)
RBC: 4.24 MIL/uL (ref 3.87–5.11)
RDW: 13.2 % (ref 11.5–15.5)
WBC: 8.4 10*3/uL (ref 4.0–10.5)
nRBC: 0 % (ref 0.0–0.2)

## 2022-05-27 LAB — RAPID URINE DRUG SCREEN, HOSP PERFORMED
Amphetamines: NOT DETECTED
Barbiturates: NOT DETECTED
Benzodiazepines: NOT DETECTED
Cocaine: NOT DETECTED
Opiates: NOT DETECTED
Tetrahydrocannabinol: NOT DETECTED

## 2022-05-27 LAB — I-STAT BETA HCG BLOOD, ED (MC, WL, AP ONLY): I-stat hCG, quantitative: <5 m[IU]/mL (ref <5)

## 2022-05-27 LAB — ETHANOL: Alcohol, Ethyl (B): <10 mg/dL (ref <10)

## 2022-05-27 MED ORDER — BUPRENORPHINE HCL-NALOXONE HCL 8-2 MG SL FILM: 1.0000 | ORAL_FILM | Freq: Two times a day (BID) | SUBLINGUAL | 0 refills | Status: DC

## 2022-05-27 MED ORDER — ONDANSETRON 4 MG PO TBDP
4.0000 mg | ORAL_TABLET | Freq: Once | ORAL | Status: AC
  Administered 2022-05-27: 4 mg via ORAL
  Filled 2022-05-27: qty 1

## 2022-05-27 NOTE — ED Triage Notes (Addendum)
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 Last time she had suboxone was 3/3. Feeling nauseous and trouble sleeping.
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
## 2022-05-27 NOTE — Discharge Instructions (Addendum)
It was our pleasure to provide your ER care today - we hope that you feel better.  Take your medication as prescribed. You must follow up with your MAT/suboxone clinic this week to get new prescription - discuss with your doctors your plan to slowly taper down and off in future.   Follow up with your doctor in the next 1-2 weeks for blood pressure, that is high today.  Return to ER if worse, new symptoms, new/severe pain, fevers, persistent vomiting, or other emergency concern.
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
## 2022-09-19 IMAGING — DX DG TIBIA/FIBULA 2V*L*
3 series · 3 of 3 positions shown · non-contrast
Comparison: None.

CLINICAL DATA: Left leg pain status post fall

And swelling and bruising of left shin
EXAM:
LEFT TIBIA AND FIBULA - 2 VIEW

[tibia ap]
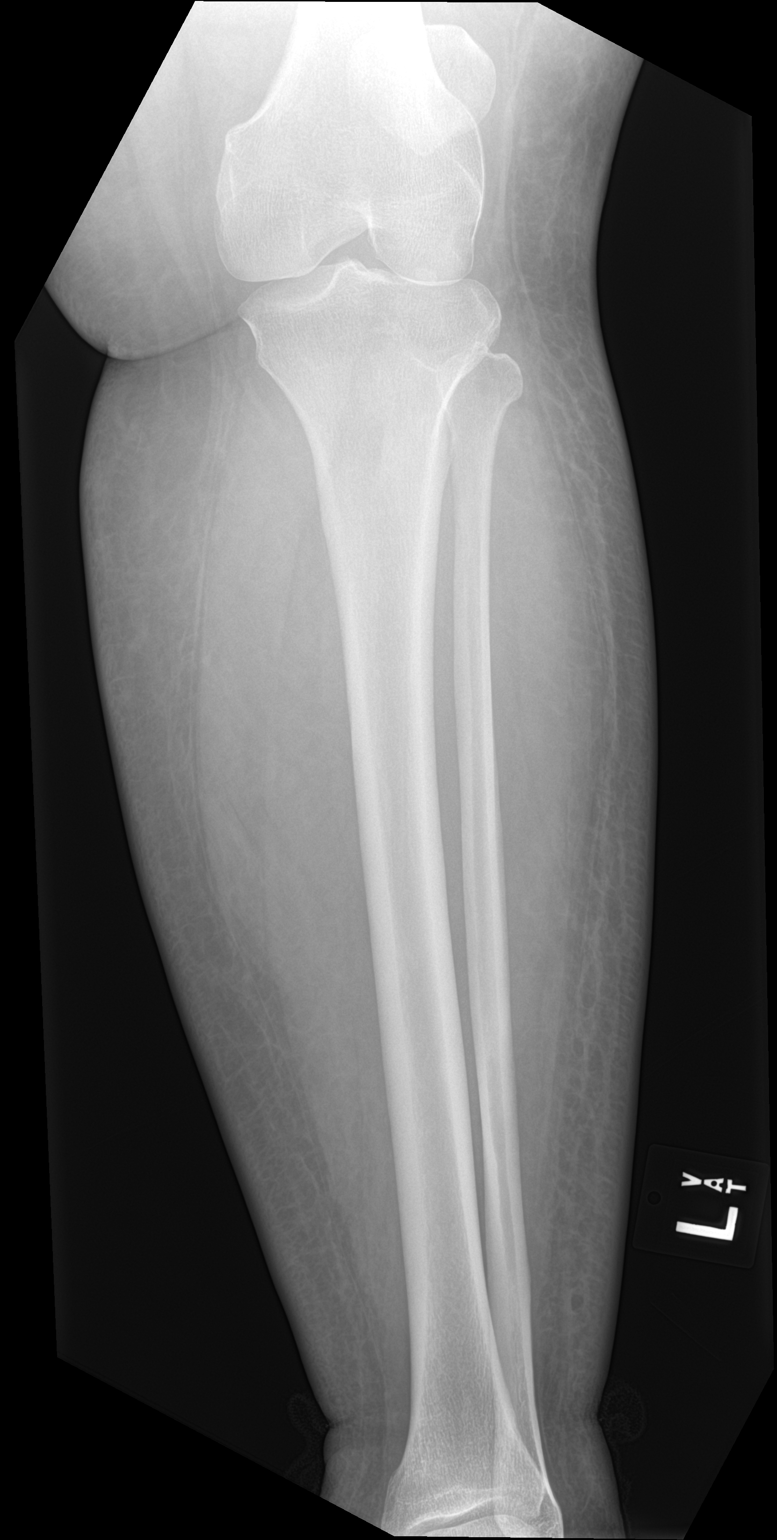

[tibia lat (1 of 2)]
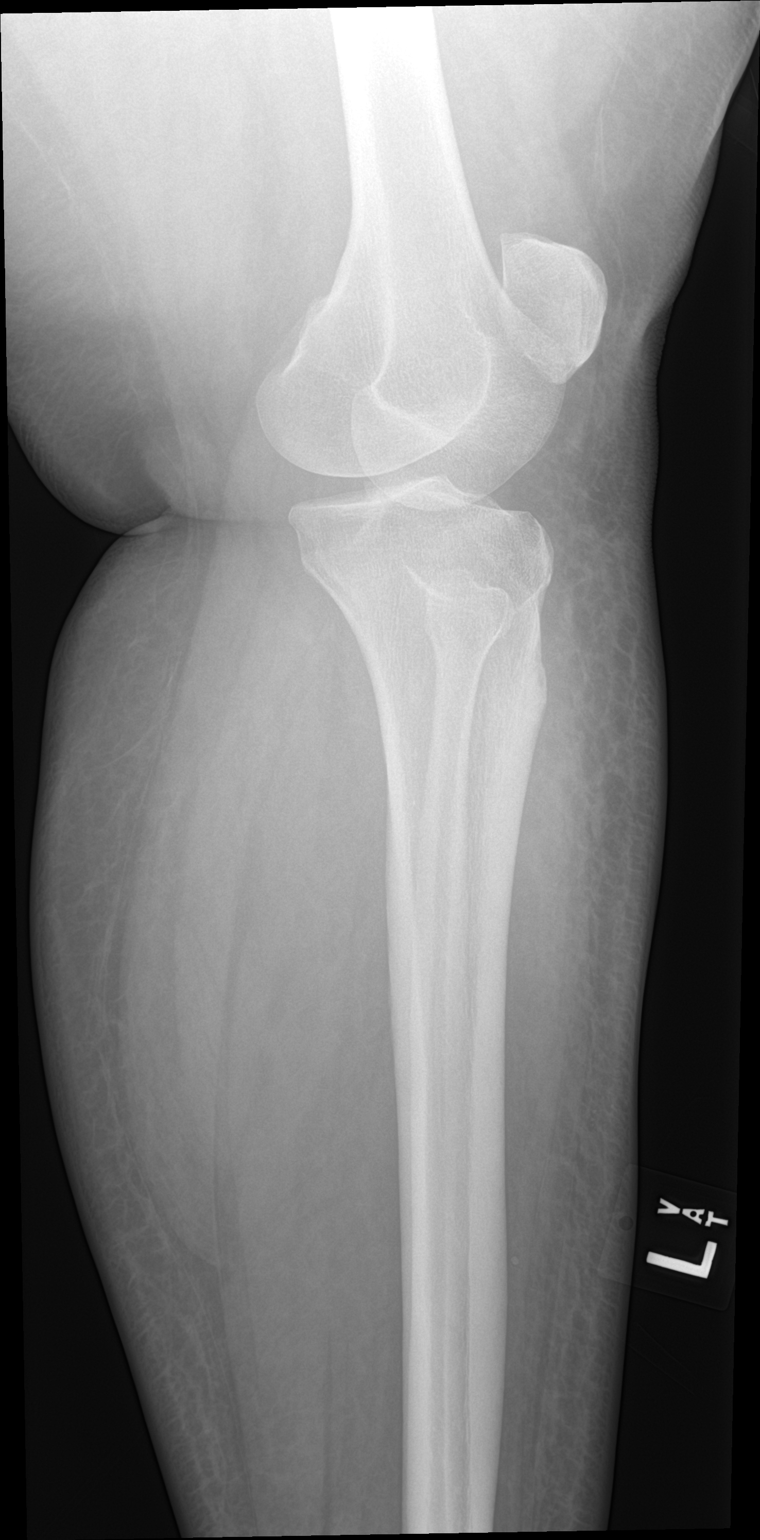

[tibia lat (2 of 2)]
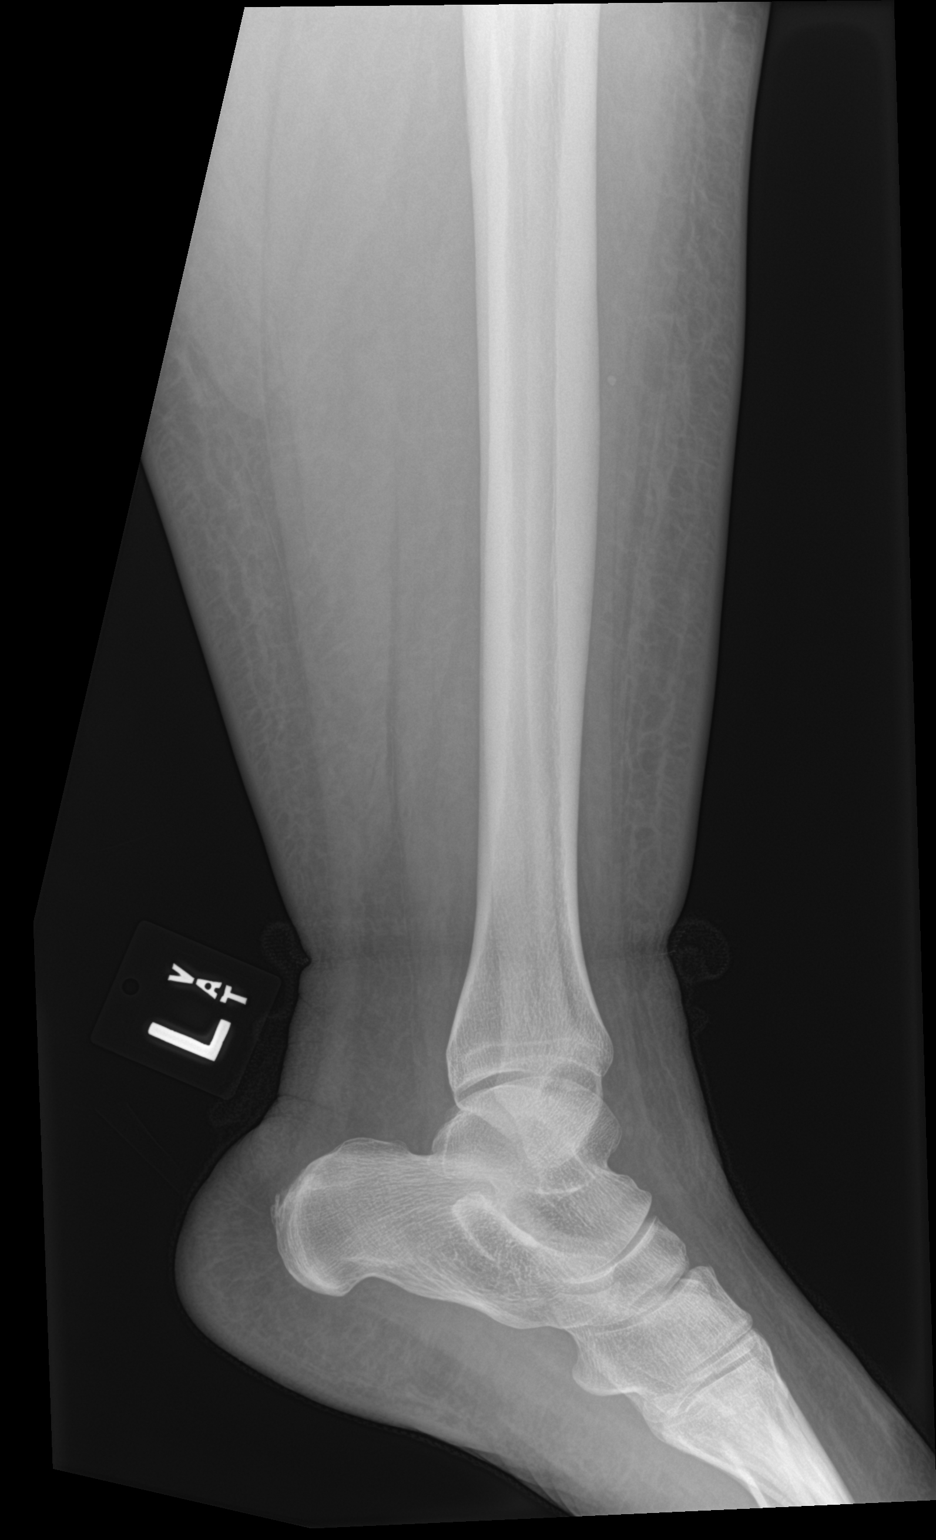

[3 of 3 positions shown; findings below may reference images not displayed]

FINDINGS: There is no evidence of fracture or other focal bone lesions. Soft
tissues are unremarkable.

The distal tip of the medial and lateral malleoli are not visualized
on the frontal view.
IMPRESSION: 1. No acute abnormality of the tibia or fibula.
2. If the patient has tenderness of the ankle, dedicated ankle
radiograph should be obtained.

## 2022-10-05 ENCOUNTER — Emergency Department (HOSPITAL_COMMUNITY)
Admission: EM | Admit: 2022-10-05 | Discharge: 2022-10-05 | Disposition: A | Discharge status: Home / Self Care | Payer: MEDICAID | Attending: Emergency Medicine | Admitting: Emergency Medicine

## 2022-10-05 ENCOUNTER — Other Ambulatory Visit: Payer: Self-pay

## 2022-10-05 DIAGNOSIS — S301XXA Contusion of abdominal wall, initial encounter: Secondary | ICD-10-CM | POA: Insufficient documentation

## 2022-10-05 DIAGNOSIS — W010XXA Fall on same level from slipping, tripping and stumbling without subsequent striking against object, initial encounter: Secondary | ICD-10-CM | POA: Insufficient documentation

## 2022-10-05 DIAGNOSIS — S0093XA Contusion of unspecified part of head, initial encounter: Secondary | ICD-10-CM | POA: Diagnosis not present

## 2022-10-05 DIAGNOSIS — S0990XA Unspecified injury of head, initial encounter: Secondary | ICD-10-CM

## 2022-10-05 NOTE — ED Triage Notes (Signed)
Pt arrived via POV. C/o lump on forehead and headaches, and bruising on R abdomen after falling 4x days ago. Pt does not think they had LOC.   AOx4

## 2022-10-05 NOTE — ED Provider Notes (Signed)
[place] EMERGENCY DEPARTMENT AT [facility] Provider Note   CSN: [id number] Arrival date & time: 10/05/22  4098     History  Chief Complaint  Patient presents with   Fall   Head Injury    Vanessa Morton is a 37 y.o. female.  37 year old female who presents after a fall 4 days ago.  Patient states that she took her seizure medication and lost her balance and fell.  Did not strike her head.  Since that time she has had a bruise to her left frontal forehead as well as a bruise to her right flank.  Mild headache without emesis.  Does not take any blood thinners.  Notes no new neurological symptoms.  Had a bruise to her right flank which has been resolving.  Denies any internal abdominal discomfort.  She has not been short of breath.       Home Medications Prior to Admission medications   Medication Sig Start Date End Date Taking? Authorizing Provider  Aspirin-Acetaminophen-Caffeine (GOODYS EXTRA STRENGTH) [phone] MG PACK Take 1 packet by mouth 3 (three) times daily as needed (pain).    [provider]  Buprenorphine HCl-Naloxone HCl 8-2 MG FILM Place 1 Film under the tongue in the morning, at noon, and at bedtime.    [provider]  Buprenorphine HCl-Naloxone HCl 8-2 MG FILM Place 1 Film under the tongue 2 (two) times daily. 05/27/22   Cathren Laine, MD  clindamycin (CLEOCIN T) 1 % external solution Apply 1 application topically daily as needed (irritation). 02/22/21   [provider]  EPINEPHrine 0.3 mg/0.3 mL IJ SOAJ injection Inject 0.3 mg into the muscle as needed for anaphylaxis. 10/27/20   [provider]  FEROSUL 325 (65 Fe) MG tablet Take 325 mg by mouth daily. 09/07/20   [provider]  gabapentin (NEURONTIN) 400 MG capsule Take 400 mg by mouth 3 (three) times daily. 09/29/20   [provider]  hydrochlorothiazide (HYDRODIURIL) 25 MG tablet Take 25 mg by mouth every morning. 11/25/19   [provider]   ibuprofen (ADVIL) 200 MG tablet Take 800 mg by mouth every 8 (eight) hours as needed for moderate pain. 10/10/20   [provider]  ibuprofen (ADVIL) 600 MG tablet Take 1 tablet (600 mg total) by mouth every 8 (eight) hours as needed for mild pain. 08/18/21   Wynetta Fines, MD  losartan (COZAAR) 50 MG tablet Take 50 mg by mouth daily. 10/29/19   [provider]  metFORMIN (GLUCOPHAGE) 500 MG tablet Take 500 mg by mouth daily with breakfast.    [provider]  mupirocin ointment (BACTROBAN) 2 % Apply 1 application topically daily. 03/27/21   [provider]  naproxen (NAPROSYN) 500 MG tablet Take 1 tablet (500 mg total) by mouth 2 (two) times daily. 05/03/21   Raspet, Noberto Retort, PA-C  oxybutynin (DITROPAN-XL) 5 MG 24 hr tablet Take 5 mg by mouth at bedtime.    [provider]  oxyCODONE-acetaminophen (PERCOCET) 5-325 MG tablet Take 1 tablet by mouth every 4 (four) hours as needed. 04/03/21   Ocie Doyne, DMD  oxyCODONE-acetaminophen (PERCOCET) 5-325 MG tablet Take 1-2 tablets by mouth every 6 (six) hours as needed. 03/17/22   Roxy Horseman, PA-C  pantoprazole (PROTONIX) 20 MG tablet Take 1 tablet (20 mg total) by mouth daily. Patient not taking: Reported on 03/29/2021 11/30/20   Rolan Bucco, MD  scopolamine (TRANSDERM-SCOP) 1 MG/3DAYS Place 1 patch (1.5 mg total) onto the skin every  3 (three) days. 05/21/22   Jacalyn Lefevre, MD  Semaglutide, 1 MG/DOSE, (OZEMPIC, 1 MG/DOSE,) 4 MG/3ML SOPN Inject 1 mg into the skin every Wednesday.    [provider]  sertraline (ZOLOFT) 50 MG tablet Take 50 mg by mouth daily.    [provider]  sucralfate (CARAFATE) 1 g tablet Take 1 tablet (1 g total) by mouth 4 (four) times daily -  with meals and at bedtime. Patient not taking: Reported on 03/29/2021 11/30/20   Rolan Bucco, MD  sulfamethoxazole-trimethoprim (BACTRIM DS) 800-160 MG tablet Take 1 tablet by mouth 2 (two) times daily. Take for 7 days,  started 03/27/21    [provider]  zolpidem (AMBIEN) 10 MG tablet Take 10 mg by mouth at bedtime.    [provider]      Allergies    Amoxicillin and Clindamycin    Review of Systems   Review of Systems  All other systems reviewed and are negative.   Physical Exam Updated Vital Signs BP (!) 157/108   Pulse 75   Temp 98 F (36.7 C) (Oral)   Resp 17   Ht 1.549 m (5\' 1" )   Wt 124.7 kg   SpO2 100%   BMI 51.96 kg/m  Physical Exam Vitals and nursing note reviewed.  Constitutional:      General: She is not in acute distress.    Appearance: Normal appearance. She is well-developed. She is not toxic-appearing.  HENT:     Head:   Eyes:     General: Lids are normal.     Conjunctiva/sclera: Conjunctivae normal.     Pupils: Pupils are equal, round, and reactive to light.  Neck:     Thyroid: No thyroid mass.     Trachea: No tracheal deviation.  Cardiovascular:     Rate and Rhythm: Normal rate and regular rhythm.     Heart sounds: Normal heart sounds. No murmur heard.    No gallop.  Pulmonary:     Effort: Pulmonary effort is normal. No respiratory distress.     Breath sounds: Normal breath sounds. No stridor. No decreased breath sounds, wheezing, rhonchi or rales.  Abdominal:     General: There is no distension.     Palpations: Abdomen is soft.     Tenderness: There is no abdominal tenderness. There is no rebound.       Comments: Resolving bruise  Musculoskeletal:        General: No tenderness. Normal range of motion.     Cervical back: Normal range of motion and neck supple.  Skin:    General: Skin is warm and dry.     Findings: No abrasion or rash.  Neurological:     Mental Status: She is alert and oriented to person, place, and time. Mental status is at baseline.     GCS: GCS eye subscore is 4. GCS verbal subscore is 5. GCS motor subscore is 6.     Cranial Nerves: Cranial nerves are intact. No cranial nerve deficit.     Sensory: No sensory  deficit.     Motor: Motor function is intact.  Psychiatric:        Attention and Perception: Attention normal.        Speech: Speech normal.        Behavior: Behavior normal.     ED Results / Procedures / Treatments   Labs (all labs ordered are listed, but only abnormal results are displayed) Labs Reviewed - No data to display  EKG None  Radiology No results found.  Procedures Procedures    Medications Ordered in ED Medications - No data to display  ED Course/ Medical Decision Making/ A&P                             Medical Decision Making  Patient with normal neurological exam here.  No indication for imaging at this time.  Abdominal wall trauma noted.  No concern for intra-abdominal trauma.  These findings discussed with patient and shared decision making done about need for scans and she agrees to hold off at this time.  Return precautions given        Final Clinical Impression(s) / ED Diagnoses Final diagnoses:  None    Rx / DC Orders ED Discharge Orders     None         Lorre Nick, MD 10/05/22 [phone]

## 2022-10-19 ENCOUNTER — Emergency Department (HOSPITAL_COMMUNITY)
Admission: EM | Admit: 2022-10-19 | Discharge: 2022-10-19 | Disposition: A | Discharge status: Home / Self Care | Payer: MEDICAID | Attending: Emergency Medicine | Admitting: Emergency Medicine

## 2022-10-19 ENCOUNTER — Other Ambulatory Visit: Payer: Self-pay

## 2022-10-19 ENCOUNTER — Emergency Department (HOSPITAL_COMMUNITY): Payer: MEDICAID

## 2022-10-19 ENCOUNTER — Encounter (HOSPITAL_COMMUNITY): Payer: Self-pay

## 2022-10-19 DIAGNOSIS — K760 Fatty (change of) liver, not elsewhere classified: Secondary | ICD-10-CM | POA: Insufficient documentation

## 2022-10-19 DIAGNOSIS — I10 Essential (primary) hypertension: Secondary | ICD-10-CM | POA: Insufficient documentation

## 2022-10-19 DIAGNOSIS — R112 Nausea with vomiting, unspecified: Secondary | ICD-10-CM | POA: Insufficient documentation

## 2022-10-19 DIAGNOSIS — R1084 Generalized abdominal pain: Secondary | ICD-10-CM | POA: Diagnosis present

## 2022-10-19 DIAGNOSIS — R109 Unspecified abdominal pain: Secondary | ICD-10-CM

## 2022-10-19 DIAGNOSIS — R600 Localized edema: Secondary | ICD-10-CM | POA: Diagnosis not present

## 2022-10-19 DIAGNOSIS — Z1152 Encounter for screening for COVID-19: Secondary | ICD-10-CM | POA: Diagnosis not present

## 2022-10-19 DIAGNOSIS — D649 Anemia, unspecified: Secondary | ICD-10-CM | POA: Diagnosis not present

## 2022-10-19 DIAGNOSIS — R7401 Elevation of levels of liver transaminase levels: Secondary | ICD-10-CM | POA: Diagnosis not present

## 2022-10-19 DIAGNOSIS — Z79899 Other long term (current) drug therapy: Secondary | ICD-10-CM | POA: Insufficient documentation

## 2022-10-19 DIAGNOSIS — R7989 Other specified abnormal findings of blood chemistry: Secondary | ICD-10-CM

## 2022-10-19 DIAGNOSIS — Z7982 Long term (current) use of aspirin: Secondary | ICD-10-CM | POA: Diagnosis not present

## 2022-10-19 LAB — COMPREHENSIVE METABOLIC PANEL
ALT: 47 U/L — ABNORMAL HIGH (ref 0–44)
AST: 54 U/L — ABNORMAL HIGH (ref 15–41)
Albumin: 4.5 g/dL (ref 3.5–5.0)
Alkaline Phosphatase: 57 U/L (ref 38–126)
Anion gap: 13 (ref 5–15)
BUN: 6 mg/dL (ref 6–20)
CO2: 22 mmol/L (ref 22–32)
Calcium: 9.3 mg/dL (ref 8.9–10.3)
Chloride: 102 mmol/L (ref 98–111)
Creatinine, Ser: 0.57 mg/dL (ref 0.44–1.00)
GFR, Estimated: >60 mL/min (ref >60)
Glucose, Bld: 73 mg/dL (ref 70–99)
Potassium: 3.5 mmol/L (ref 3.5–5.1)
Sodium: 137 mmol/L (ref 135–145)
Total Bilirubin: 1 mg/dL (ref 0.3–1.2)
Total Protein: 8.9 g/dL — ABNORMAL HIGH (ref 6.5–8.1)

## 2022-10-19 LAB — CBC WITH DIFFERENTIAL/PLATELET
Abs Immature Granulocytes: 0.03 10*3/uL (ref 0.00–0.07)
Basophils Absolute: 0 10*3/uL (ref 0.0–0.1)
Basophils Relative: 1 %
Eosinophils Absolute: 0.1 10*3/uL (ref 0.0–0.5)
Eosinophils Relative: 2 %
HCT: 38 % (ref 36.0–46.0)
Hemoglobin: 11.6 g/dL — ABNORMAL LOW (ref 12.0–15.0)
Immature Granulocytes: 1 %
Lymphocytes Relative: 26 %
Lymphs Abs: 1.7 10*3/uL (ref 0.7–4.0)
MCH: 28.1 pg (ref 26.0–34.0)
MCHC: 30.5 g/dL (ref 30.0–36.0)
MCV: 92 fL (ref 80.0–100.0)
Monocytes Absolute: 0.5 10*3/uL (ref 0.1–1.0)
Monocytes Relative: 8 %
Neutro Abs: 4.1 10*3/uL (ref 1.7–7.7)
Neutrophils Relative %: 62 %
Platelets: 359 10*3/uL (ref 150–400)
RBC: 4.13 MIL/uL (ref 3.87–5.11)
RDW: 13.2 % (ref 11.5–15.5)
WBC: 6.6 10*3/uL (ref 4.0–10.5)
nRBC: 0 % (ref 0.0–0.2)

## 2022-10-19 LAB — URINALYSIS, W/ REFLEX TO CULTURE (INFECTION SUSPECTED)
Bilirubin Urine: NEGATIVE
Glucose, UA: NEGATIVE mg/dL
Ketones, ur: 80 mg/dL — AB
Leukocytes,Ua: NEGATIVE
Nitrite: NEGATIVE
Protein, ur: 30 mg/dL — AB
RBC / HPF: >50 RBC/hpf (ref 0–5)
Specific Gravity, Urine: 1.023 (ref 1.005–1.030)
pH: 5 (ref 5.0–8.0)

## 2022-10-19 LAB — I-STAT CG4 LACTIC ACID, ED: Lactic Acid, Venous: 1 mmol/L (ref 0.5–1.9)

## 2022-10-19 LAB — RESP PANEL BY RT-PCR (RSV, FLU A&B, COVID)  RVPGX2
Influenza A by PCR: NEGATIVE
Influenza B by PCR: NEGATIVE
Resp Syncytial Virus by PCR: NEGATIVE
SARS Coronavirus 2 by RT PCR: NEGATIVE

## 2022-10-19 LAB — TROPONIN I (HIGH SENSITIVITY): Troponin I (High Sensitivity): 5 ng/L (ref <18)

## 2022-10-19 LAB — HCG, SERUM, QUALITATIVE: Preg, Serum: NEGATIVE

## 2022-10-19 LAB — LIPASE, BLOOD: Lipase: 32 U/L (ref 11–51)

## 2022-10-19 LAB — BRAIN NATRIURETIC PEPTIDE: B Natriuretic Peptide: 38 pg/mL (ref 0.0–100.0)

## 2022-10-19 MED ORDER — ONDANSETRON 4 MG PO TBDP: 4.0000 mg | ORAL_TABLET | Freq: Three times a day (TID) | ORAL | 0 refills | Status: DC | PRN

## 2022-10-19 MED ORDER — OXYCODONE HCL 5 MG PO TABS: 5.0000 mg | ORAL_TABLET | ORAL | 0 refills | Status: DC | PRN

## 2022-10-19 MED ORDER — MORPHINE SULFATE (PF) 4 MG/ML IV SOLN
4.0000 mg | Freq: Once | INTRAVENOUS | Status: AC
  Administered 2022-10-19: 4 mg via INTRAVENOUS
  Filled 2022-10-19: qty 1

## 2022-10-19 MED ORDER — ONDANSETRON HCL 4 MG/2ML IJ SOLN
4.0000 mg | Freq: Once | INTRAMUSCULAR | Status: AC
  Administered 2022-10-19: 4 mg via INTRAVENOUS
  Filled 2022-10-19: qty 2

## 2022-10-19 MED ORDER — IOHEXOL 350 MG/ML SOLN
100.0000 mL | Freq: Once | INTRAVENOUS | Status: AC | PRN
  Administered 2022-10-19: 100 mL via INTRAVENOUS

## 2022-10-19 MED ORDER — SODIUM CHLORIDE 0.9 % IV BOLUS
500.0000 mL | Freq: Once | INTRAVENOUS | Status: AC
  Administered 2022-10-19: 500 mL via INTRAVENOUS

## 2022-10-19 NOTE — Discharge Instructions (Signed)
Your history, exam, and evaluation today led [place] to get all of the imaging and labs we discussed.  The CT scan showed your known fatty hernia that looks improved from prior.  You did have some fat in your liver and your liver function was slightly elevated so please use the pain medicine without Tylenol.  Please rest and stay hydrated.  Please use the nausea medicine.  Please follow-up with your primary team.  If any symptoms change or worsen acutely, please return to the nearest emergency department.

## 2022-10-19 NOTE — ED Triage Notes (Signed)
Pt coming in complaining of emesis, pain in legs/abd/chest, feelings of malaise. Pt recently began taking her Ozempic again, states she experienced side effects before while taking the medication, and this feels like a more extreme version of the side effects.

## 2022-10-19 NOTE — ED Provider Notes (Signed)
[place] EMERGENCY DEPARTMENT AT [facility] Provider Note   CSN: [id number] Arrival date & time: 10/19/22  1719     History  Chief Complaint  Patient presents with   Abdominal Pain   Emesis    Vanessa Morton is a 37 y.o. female.  The history is provided by the patient and medical records. No language interpreter was used.  Abdominal Pain Pain location:  Generalized Pain quality: aching, bloating and cramping   Pain radiates to:  Does not radiate Pain severity:  Severe Onset quality:  Gradual Duration:  1 day Timing:  Constant Progression:  Waxing and waning Chronicity:  New Relieved by:  Nothing Worsened by:  Nothing Associated symptoms: belching, chest pain, chills, constipation, fatigue, nausea and vomiting   Associated symptoms: no cough, no diarrhea, no dysuria, no fever, no melena, no shortness of breath, no vaginal bleeding and no vaginal discharge   Risk factors: multiple surgeries and obesity   Emesis Associated symptoms: abdominal pain and chills   Associated symptoms: no cough, no diarrhea, no fever and no headaches        Home Medications Prior to Admission medications   Medication Sig Start Date End Date Taking? Authorizing Provider  Aspirin-Acetaminophen-Caffeine (GOODYS EXTRA STRENGTH) [phone] MG PACK Take 1 packet by mouth 3 (three) times daily as needed (pain).    [provider]  Buprenorphine HCl-Naloxone HCl 8-2 MG FILM Place 1 Film under the tongue in the morning, at noon, and at bedtime.    [provider]  Buprenorphine HCl-Naloxone HCl 8-2 MG FILM Place 1 Film under the tongue 2 (two) times daily. 05/27/22   Cathren Laine, MD  clindamycin (CLEOCIN T) 1 % external solution Apply 1 application topically daily as needed (irritation). 02/22/21   [provider]  EPINEPHrine 0.3 mg/0.3 mL IJ SOAJ injection Inject 0.3 mg into the muscle as needed for anaphylaxis. 10/27/20   [provider]  FEROSUL  325 (65 Fe) MG tablet Take 325 mg by mouth daily. 09/07/20   [provider]  gabapentin (NEURONTIN) 400 MG capsule Take 400 mg by mouth 3 (three) times daily. 09/29/20   [provider]  hydrochlorothiazide (HYDRODIURIL) 25 MG tablet Take 25 mg by mouth every morning. 11/25/19   [provider]  ibuprofen (ADVIL) 200 MG tablet Take 800 mg by mouth every 8 (eight) hours as needed for moderate pain. 10/10/20   [provider]  ibuprofen (ADVIL) 600 MG tablet Take 1 tablet (600 mg total) by mouth every 8 (eight) hours as needed for mild pain. 08/18/21   Wynetta Fines, MD  losartan (COZAAR) 50 MG tablet Take 50 mg by mouth daily. 10/29/19   [provider]  metFORMIN (GLUCOPHAGE) 500 MG tablet Take 500 mg by mouth daily with breakfast.    [provider]  mupirocin ointment (BACTROBAN) 2 % Apply 1 application topically daily. 03/27/21   [provider]  naproxen (NAPROSYN) 500 MG tablet Take 1 tablet (500 mg total) by mouth 2 (two) times daily. 05/03/21   Raspet, Noberto Retort, PA-C  oxybutynin (DITROPAN-XL) 5 MG 24 hr tablet Take 5 mg by mouth at bedtime.    [provider]  oxyCODONE-acetaminophen (PERCOCET) 5-325 MG tablet Take 1 tablet by mouth every 4 (four) hours as needed. 04/03/21   Ocie Doyne, DMD  oxyCODONE-acetaminophen (PERCOCET) 5-325 MG tablet Take 1-2 tablets by mouth every 6 (six) hours as needed. 03/17/22   Roxy Horseman, PA-C  pantoprazole (PROTONIX) 20 MG  tablet Take 1 tablet (20 mg total) by mouth daily. Patient not taking: Reported on 03/29/2021 11/30/20   Rolan Bucco, MD  scopolamine (TRANSDERM-SCOP) 1 MG/3DAYS Place 1 patch (1.5 mg total) onto the skin every 3 (three) days. 05/21/22   Jacalyn Lefevre, MD  Semaglutide, 1 MG/DOSE, (OZEMPIC, 1 MG/DOSE,) 4 MG/3ML SOPN Inject 1 mg into the skin every Wednesday.    [provider]  sertraline (ZOLOFT) 50 MG tablet Take 50 mg by mouth daily.    [provider]  sucralfate (CARAFATE) 1 g tablet Take 1 tablet (1 g total) by mouth 4 (four) times daily -  with meals and at bedtime. Patient not taking: Reported on 03/29/2021 11/30/20   Rolan Bucco, MD  sulfamethoxazole-trimethoprim (BACTRIM DS) 800-160 MG tablet Take 1 tablet by mouth 2 (two) times daily. Take for 7 days, started 03/27/21    [provider]  zolpidem (AMBIEN) 10 MG tablet Take 10 mg by mouth at bedtime.    [provider]      Allergies    Amoxicillin and Clindamycin    Review of Systems   Review of Systems  Constitutional:  Positive for chills, fatigue and unexpected weight change. Negative for diaphoresis and fever.  HENT:  Negative for congestion.   Eyes:  Negative for visual disturbance.  Respiratory:  Negative for cough, chest tightness, shortness of breath and wheezing.   Cardiovascular:  Positive for chest pain and leg swelling (improved from prior per pt). Negative for palpitations.  Gastrointestinal:  Positive for abdominal distention, abdominal pain, constipation, nausea and vomiting. Negative for diarrhea and melena.  Genitourinary:  Negative for dysuria, flank pain, frequency, vaginal bleeding and vaginal discharge.  Musculoskeletal:  Negative for back pain, neck pain and neck stiffness.  Skin:  Negative for rash and wound.  Neurological:  Negative for light-headedness and headaches.  Psychiatric/Behavioral:  Negative for agitation and confusion.   All other systems reviewed and are negative.   Physical Exam Updated Vital Signs BP (!) 194/132 (BP Location: Left Arm)   Pulse 92   Temp 98.6 F (37 C) (Oral)   Resp 16   Ht 5\' 1"  (1.549 m)   Wt 117.9 kg   SpO2 100%   BMI 49.13 kg/m  Physical Exam Vitals and nursing note reviewed.  Constitutional:      General: She is not in acute distress.    Appearance: She is well-developed. She is not ill-appearing, toxic-appearing or diaphoretic.  HENT:     Head: Normocephalic and atraumatic.      Mouth/Throat:     Mouth: Mucous membranes are dry.     Pharynx: No oropharyngeal exudate or posterior oropharyngeal erythema.  Eyes:     Extraocular Movements: Extraocular movements intact.     Conjunctiva/sclera: Conjunctivae normal.     Pupils: Pupils are equal, round, and reactive to light.  Cardiovascular:     Rate and Rhythm: Normal rate and regular rhythm.     Heart sounds: No murmur heard. Pulmonary:     Effort: Pulmonary effort is normal. No respiratory distress.     Breath sounds: Normal breath sounds.  Abdominal:     Palpations: Abdomen is soft.     Tenderness: There is abdominal tenderness. There is no right CVA tenderness, left CVA tenderness, guarding or rebound.  Musculoskeletal:        General: Tenderness present. No swelling.     Cervical back: Neck supple.     Right lower leg: Edema present.  Left lower leg: Edema present.  Skin:    General: Skin is warm and dry.     Capillary Refill: Capillary refill takes less than 2 seconds.     Findings: No erythema or rash.  Neurological:     General: No focal deficit present.     Mental Status: She is alert.     Sensory: No sensory deficit.     Motor: No weakness.  Psychiatric:        Mood and Affect: Mood normal.     ED Results / Procedures / Treatments   Labs (all labs ordered are listed, but only abnormal results are displayed) Labs Reviewed  CBC WITH DIFFERENTIAL/PLATELET - Abnormal; Notable for the following components:      Result Value   Hemoglobin 11.6 (*)    All other components within normal limits  COMPREHENSIVE METABOLIC PANEL - Abnormal; Notable for the following components:   Total Protein 8.9 (*)    AST 54 (*)    ALT 47 (*)    All other components within normal limits  URINALYSIS, W/ REFLEX TO CULTURE (INFECTION SUSPECTED) - Abnormal; Notable for the following components:   Hgb urine dipstick LARGE (*)    Ketones, ur 80 (*)    Protein, ur 30 (*)    Bacteria, UA RARE (*)    All other  components within normal limits  RESP PANEL BY RT-PCR (RSV, FLU A&B, COVID)  RVPGX2  LIPASE, BLOOD  BRAIN NATRIURETIC PEPTIDE  HCG, SERUM, QUALITATIVE  I-STAT CG4 LACTIC ACID, ED  I-STAT CG4 LACTIC ACID, ED  TROPONIN I (HIGH SENSITIVITY)  TROPONIN I (HIGH SENSITIVITY)    EKG EKG Interpretation Date/Time:  Saturday October 19 2022 18:28:37 EDT Ventricular Rate:  60 PR Interval:  156 QRS Duration:  97 QT Interval:  425 QTC Calculation: 425 R Axis:   87  Text Interpretation: Sinus rhythm Nonspecific T abnrm, anterolateral leads when compared to prior, overall similar appearance. No STEMI Confirmed by Theda Belfast ([id number]) on 10/19/2022 6:30:37 PM  Radiology CT Angio Chest/Abd/Pel for Dissection W and/or Wo Contrast  Result Date: 10/19/2022 CLINICAL DATA:  Acute aortic syndrome (AAS) suspected Blood pressure over 190 with pain in chest and abdomen. Also, Previous bariatric surgery, 1 week of nausea and vomiting with no oral intake. Abdominal pain, chills, chest pain. EXAM: CT ANGIOGRAPHY CHEST, ABDOMEN AND PELVIS TECHNIQUE: Non-contrast CT of the chest was initially obtained. Multidetector CT imaging through the chest, abdomen and pelvis was performed using the standard protocol during bolus administration of intravenous contrast. Multiplanar reconstructed images and MIPs were obtained and reviewed to evaluate the vascular anatomy. RADIATION DOSE REDUCTION: This exam was performed according to the departmental dose-optimization program which includes automated exposure control, adjustment of the mA and/or kV according to patient size and/or use of iterative reconstruction technique. CONTRAST:  OMNIPAQUE IOHEXOL 350 MG/ML SOLN COMPARISON:  Abdominopelvic CT 03/17/2022 FINDINGS: CTA CHEST FINDINGS Cardiovascular: No aortic hematoma noncontrast exam. No aortic aneurysm. No dissection, vasculitis or evidence of acute aortic syndrome. Common origin of brachiocephalic and left common carotid  artery, variant arch anatomy. There is no pulmonary embolus to the segmental level. Heart is upper normal in size. No pericardial effusion. Mediastinum/Nodes: Minimal hiatal hernia. No esophageal wall thickening. No mediastinal or hilar adenopathy. No suspicious thyroid nodule. There are prominent left axillary nodes all retaining normal fatty hila and nodal morphology. Lungs/Pleura: Minor subsegmental atelectasis in the left lower lobe and lingula. No acute airspace disease. No pleural effusion. No features  of pulmonary edema. The trachea and central airways are clear. Musculoskeletal: Thoracic spondylosis with anterior spurring. There are no acute or suspicious osseous abnormalities. Review of the MIP images confirms the above findings. CTA ABDOMEN AND PELVIS FINDINGS VASCULAR Aorta: Normal caliber aorta without aneurysm, dissection, vasculitis or significant stenosis. Celiac: Patent without evidence of aneurysm, dissection, vasculitis or significant stenosis. SMA: Patent without evidence of aneurysm, dissection, vasculitis or significant stenosis. Renals: Both renal arteries are patent without evidence of aneurysm, dissection, vasculitis, fibromuscular dysplasia or significant stenosis. IMA: Patent without evidence of aneurysm, dissection, vasculitis or significant stenosis. Inflow: Patent without evidence of aneurysm, dissection, vasculitis or significant stenosis. Veins: No obvious venous abnormality within the limitations of this arterial phase study. Review of the MIP images confirms the above findings. NON-VASCULAR Hepatobiliary: The liver is mildly enlarged spanning 17.5 cm cranial caudal with steatosis. No evidence of focal liver lesion on this arterial phase exam. No calcified gallstone or pericholecystic inflammation. No biliary dilatation. Pancreas: No ductal dilatation or inflammation. Spleen: Normal in size and arterial enhancement. Adrenals/Urinary Tract: No adrenal nodule. No hydronephrosis or renal  inflammation. No focal renal abnormality. Partially distended urinary bladder, normal for degree of distension. Stomach/Bowel: Surgical clips adjacent to the greater curvature of the stomach, suspect gastric sleeve. Normal positioning of the duodenum. There is no small bowel obstruction or inflammation. High-density material is seen throughout the colon, may represent enteric contrast or bismuth containing products. High-riding cecum in the mid abdomen. Normal appendix potentially but not definitively seen. Regardless, no appendicitis. Few small bowel loops are opposed to the anterior abdominal wall, possible adhesions. Lymphatic: Small retroperitoneal mesenteric nodes, not enlarged by size criteria. No suspicious adenopathy. Reproductive: Uterus and bilateral adnexa are unremarkable. Other: No ascites or free air. Small fat containing umbilical hernia. There is a small fat containing supraumbilical ventral abdominal wall hernia with slight edema of the herniated on subjacent fat. Fat stranding has improved from December exam. No abdominopelvic collection. Musculoskeletal: L4-L5 facet hypertrophy. There are no acute or suspicious osseous abnormalities. Review of the MIP images confirms the above findings. IMPRESSION: 1. No aortic dissection or acute aortic abnormality. No pulmonary embolus to the segmental level. 2. Small fat containing supraumbilical ventral abdominal wall hernia with slight edema of the herniated on subjacent fat. This may be inflammatory. Fat stranding has improved from December exam. 3. Mild hepatomegaly and hepatic steatosis. 4. 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 Minimal hiatal hernia. 5. Few small bowel loops are opposed to the anterior abdominal wall, possible adhesions. No bowel obstruction or inflammation. Electronically Signed   By: Narda Rutherford M.D.   On: 10/19/2022 20:46   DG Chest 2 View  Result Date: 10/19/2022 CLINICAL DATA:  Chest pain, nausea, vomiting EXAM:  CHEST - 2 VIEW COMPARISON:  Previous studies including the examination of 08/18/2021 FINDINGS: Transverse diameter of heart is increased. There are no signs of pulmonary edema or focal pulmonary consolidation. There is no pleural effusion or pneumothorax. IMPRESSION: Cardiomegaly. There are no signs of pulmonary edema or focal pulmonary consolidation. Electronically Signed   By: Ernie Avena M.D.   On: 10/19/2022 18:22    Procedures Procedures    Medications Ordered in ED Medications  ondansetron [facility]) injection 4 mg (4 mg Intravenous Given 10/19/22 1923)  sodium chloride 0.9 % bolus 500 mL (500 mLs Intravenous New Bag/Given 10/19/22 1924)  morphine (PF) 4 MG/ML injection 4 mg (4 mg Intravenous Given 10/19/22 1924)  iohexol (OMNIPAQUE) 350 MG/ML injection 100 mL (100 mLs Intravenous  Contrast Given 10/19/22 2005)    ED Course/ Medical Decision Making/ A&P                                Medical Decision Making Amount and/or Complexity of Data Reviewed Labs: ordered. Radiology: ordered.  Risk Prescription drug management.    LOURA PITT is a 37 y.o. female with a past medical history significant for hypertension, anxiety, obesity status post bariatric surgery last year recently restarted Ozempic and a diuretic who presents with 1 week of abdominal pain, chest pain, nausea, vomiting, chills, fatigue, and malaise.  According to patient, she is unsure if this is just due to medication restarting or something else but she reports she is lost 10 pounds this week and is not tolerating any p.o.  She is vomiting up medications and anything she tries to get down.  She reports she is having 9 out of 10 abdominal pain but also goes towards her chest.  She reports it is more of a burning pain in her chest.  She denies history of pain like this.  She reports no recent trauma.  She reports subjective fevers and chills but denies any cough or congestion.  She denies shortness of breath with it.   Denies any history of blood clots.  She reports her legs are still slightly sore and but they are improving erratic.  She thinks she is dehydrated.  She reports no significant diarrhea or urinary changes but does have some constipation.  She had a bowel movement yesterday that she reports was normal.  No blood reported in her stools or emesis.  Exam, lungs are clear.  Chest was slightly tender to palpation.  Abdomen was tender to palpation.  Bowel sounds were appreciated.  Flanks and back nontender.  Legs are slightly edematous but she has intact pulses.  Patient has normal gait in the room.  Mucous membranes are slightly dry.  EKG showed no STEMI.  Given the patient's blood pressure of 194 systolic and her reporting severe 9 out of 10 going towards her chest, I do feel need to get a CT scan to rule out dissection however we will also make sure he does not have some other bariatric surgery complications such as internal hernia or some other pathology.  With her nausea and vomiting intolerance to p.o. will give nausea medicine and some fluids as she looks like dehydrated from a mucous membrane standpoint.  Her legs were slightly edematous but she reports it is improved from prior.  Low suspicion for critical fluid overload at this time.  We will get a COVID test due to the malaise, fevers, chills, chest pain, nausea/vomiting as you have seen some COVID with GI symptoms recently.  Will wait for electrolyte abnormality.  Anticipate reassessment after workup to determine disposition.  Workup began to return.  Patient's labs show slight elevation of LFTs but otherwise metabolic panel reassuring.  CBC shows mild anemia but no leukocytosis.  Hemoglobin similar to prior.  Patient does have some ketones in her urine likely indicative of some degree of dehydration given the diuretic use and decreased oral intake with vomiting.  She is negative for COVID and flu and RSV.  Lipase not elevated.  She is not pregnant.   BNP was not elevated and she tolerated the IV fluids.  Troponin negative.  Lactic acid normal.  CT imaging showed improved appearance of this fatty umbilical hernia and no  other evidence of acute obstruction.  There was some evidence of adhesions but no complications with her previous surgery.  Patient was starting to feel better.  We had a shared decision-making conversation and do feel she needs to follow-up with her primary team.  She will also follow-up with her PCP for the LFT elevation and will avoid Tylenol and some pain medicine prescription will give her.  She will use prescription for nausea medicine and she understood return precautions and follow-up instructions.  She was able to tolerate p.o. and will discharge.  Patient discharged in good condition with improved symptoms.         Final Clinical Impression(s) / ED Diagnoses Final diagnoses:  Nausea and vomiting, unspecified vomiting type  Abdominal pain, unspecified abdominal location  LFT elevation  Fatty infiltration of liver    Rx / DC Orders ED Discharge Orders          Ordered    oxyCODONE (ROXICODONE) 5 MG immediate release tablet  Every 4 hours PRN        10/19/22 2200    ondansetron (ZOFRAN-ODT) 4 MG disintegrating tablet  Every 8 hours PRN        10/19/22 2200           Clinical Impression: 1. Nausea and vomiting, unspecified vomiting type   2. Abdominal pain, unspecified abdominal location   3. LFT elevation   4. Fatty infiltration of liver     Disposition: Discharge  Condition: Good  I have discussed the results, Dx and Tx plan with the pt(& family if present). He/she/they expressed understanding and agree(s) with the plan. Discharge instructions discussed at great length. Strict return precautions discussed and pt &/or family have verbalized understanding of the instructions. No further questions at time of discharge.    New Prescriptions   ONDANSETRON (ZOFRAN-ODT) 4 MG DISINTEGRATING  TABLET    Take 1 tablet (4 mg total) by mouth every 8 (eight) hours as needed for nausea or vomiting.   OXYCODONE (ROXICODONE) 5 MG IMMEDIATE RELEASE TABLET    Take 1 tablet (5 mg total) by mouth every 4 (four) hours as needed for severe pain.    Follow Up: Diamantina Providence, FNP [address] [place] [address] [phone]     [facility] at [facility] [address] [place] [address] [phone]       , Canary Brim, MD 10/19/22 [phone]

## 2023-01-20 DIAGNOSIS — D509 Iron deficiency anemia, unspecified: Secondary | ICD-10-CM

## 2023-01-20 HISTORY — DX: Iron deficiency anemia, unspecified: D50.9

## 2023-03-12 ENCOUNTER — Emergency Department (HOSPITAL_BASED_OUTPATIENT_CLINIC_OR_DEPARTMENT_OTHER)
Admission: EM | Admit: 2023-03-12 | Discharge: 2023-03-12 | Disposition: A | Discharge status: Home / Self Care | Payer: MEDICAID | Attending: Emergency Medicine | Admitting: Emergency Medicine

## 2023-03-12 ENCOUNTER — Emergency Department (HOSPITAL_BASED_OUTPATIENT_CLINIC_OR_DEPARTMENT_OTHER): Payer: MEDICAID

## 2023-03-12 ENCOUNTER — Other Ambulatory Visit: Payer: Self-pay

## 2023-03-12 ENCOUNTER — Encounter (HOSPITAL_BASED_OUTPATIENT_CLINIC_OR_DEPARTMENT_OTHER): Payer: Self-pay | Admitting: *Deleted

## 2023-03-12 DIAGNOSIS — I10 Essential (primary) hypertension: Secondary | ICD-10-CM | POA: Diagnosis not present

## 2023-03-12 DIAGNOSIS — R109 Unspecified abdominal pain: Secondary | ICD-10-CM | POA: Diagnosis not present

## 2023-03-12 DIAGNOSIS — Z79899 Other long term (current) drug therapy: Secondary | ICD-10-CM | POA: Insufficient documentation

## 2023-03-12 DIAGNOSIS — R197 Diarrhea, unspecified: Secondary | ICD-10-CM | POA: Diagnosis present

## 2023-03-12 LAB — CBC
HCT: 34.8 % — ABNORMAL LOW (ref 36.0–46.0)
Hemoglobin: 10.7 g/dL — ABNORMAL LOW (ref 12.0–15.0)
MCH: 27.9 pg (ref 26.0–34.0)
MCHC: 30.7 g/dL (ref 30.0–36.0)
MCV: 90.9 fL (ref 80.0–100.0)
Platelets: 299 10*3/uL (ref 150–400)
RBC: 3.83 MIL/uL — ABNORMAL LOW (ref 3.87–5.11)
RDW: 21.2 % — ABNORMAL HIGH (ref 11.5–15.5)
WBC: 5.6 10*3/uL (ref 4.0–10.5)
nRBC: 0 % (ref 0.0–0.2)

## 2023-03-12 LAB — URINALYSIS, ROUTINE W REFLEX MICROSCOPIC
Bacteria, UA: NONE SEEN
Bilirubin Urine: NEGATIVE
Glucose, UA: NEGATIVE mg/dL
Ketones, ur: NEGATIVE mg/dL
Leukocytes,Ua: NEGATIVE
Nitrite: NEGATIVE
Protein, ur: 30 mg/dL — AB
Specific Gravity, Urine: 1.039 — ABNORMAL HIGH (ref 1.005–1.030)
pH: 7 (ref 5.0–8.0)

## 2023-03-12 LAB — COMPREHENSIVE METABOLIC PANEL
ALT: 14 U/L (ref 0–44)
AST: 20 U/L (ref 15–41)
Albumin: 4.2 g/dL (ref 3.5–5.0)
Alkaline Phosphatase: 52 U/L (ref 38–126)
Anion gap: 5 (ref 5–15)
BUN: 14 mg/dL (ref 6–20)
CO2: 26 mmol/L (ref 22–32)
Calcium: 8.8 mg/dL — ABNORMAL LOW (ref 8.9–10.3)
Chloride: 108 mmol/L (ref 98–111)
Creatinine, Ser: 0.62 mg/dL (ref 0.44–1.00)
GFR, Estimated: >60 mL/min (ref >60)
Glucose, Bld: 75 mg/dL (ref 70–99)
Potassium: 4.5 mmol/L (ref 3.5–5.1)
Sodium: 139 mmol/L (ref 135–145)
Total Bilirubin: 0.3 mg/dL (ref <1.2)
Total Protein: 7.7 g/dL (ref 6.5–8.1)

## 2023-03-12 LAB — HCG, SERUM, QUALITATIVE: Preg, Serum: NEGATIVE

## 2023-03-12 LAB — LIPASE, BLOOD: Lipase: 29 U/L (ref 11–51)

## 2023-03-12 MED ORDER — PANTOPRAZOLE SODIUM 40 MG PO TBEC
40.0000 mg | DELAYED_RELEASE_TABLET | Freq: Once | ORAL | Status: AC
  Administered 2023-03-12: 40 mg via ORAL
  Filled 2023-03-12: qty 1

## 2023-03-12 MED ORDER — DICYCLOMINE HCL 20 MG PO TABS: 20.0000 mg | ORAL_TABLET | Freq: Two times a day (BID) | ORAL | 0 refills | Status: DC

## 2023-03-12 MED ORDER — IOHEXOL 300 MG/ML  SOLN
100.0000 mL | Freq: Once | INTRAMUSCULAR | Status: AC | PRN
  Administered 2023-03-12: 100 mL via INTRAVENOUS

## 2023-03-12 MED ORDER — DICYCLOMINE HCL 10 MG PO CAPS
10.0000 mg | ORAL_CAPSULE | Freq: Once | ORAL | Status: AC
  Administered 2023-03-12: 10 mg via ORAL
  Filled 2023-03-12: qty 1

## 2023-03-12 MED ORDER — ONDANSETRON HCL 4 MG PO TABS
4.0000 mg | ORAL_TABLET | Freq: Once | ORAL | Status: AC
  Administered 2023-03-12: 4 mg via ORAL
  Filled 2023-03-12: qty 1

## 2023-03-12 MED ORDER — ONDANSETRON HCL 4 MG PO TABS: 4.0000 mg | ORAL_TABLET | Freq: Four times a day (QID) | ORAL | 0 refills | Status: DC

## 2023-03-12 NOTE — ED Provider Notes (Signed)
[place] EMERGENCY DEPARTMENT AT [facility] Provider Note  CSN: [id number] Arrival date & time: 03/12/23 1922  Chief Complaint(s) Diarrhea  HPI Vanessa Morton is a 37 y.o. female here today for diarrhea and colicky abdominal pain.  Patient has a history of gastric bypass surgery, has been taking double SGL 2 inhibitors over the last couple of months for management of of her weight.  She says that she has now started her third 1, continues to have episodes of diarrhea and abdominal pain.  She last received a shot 6 days ago.  Past Medical History Past Medical History:  Diagnosis Date   Abscess    Anemia    Anxiety    Chest pain    Gonorrhea    Hypertension    Morbid obesity (HCC)    Obesity    Pre-diabetes    Urinary tract infection    Patient Active Problem List   Diagnosis Date Noted   Drug allergy 11/16/2020   Abscess of back 10/20/2018   Axillary abscess 10/20/2018   Morbid obesity (HCC) 10/20/2018   Rash/skin eruption 10/20/2018   Home Medication(s) Prior to Admission medications   Medication Sig Start Date End Date Taking? Authorizing Provider  dicyclomine (BENTYL) 20 MG tablet Take 1 tablet (20 mg total) by mouth 2 (two) times daily. 03/12/23  Yes Anders Simmonds T, DO  ondansetron (ZOFRAN) 4 MG tablet Take 1 tablet (4 mg total) by mouth every 6 (six) hours. 03/12/23  Yes Anders Simmonds T, DO  Aspirin-Acetaminophen-Caffeine (GOODYS EXTRA STRENGTH) [phone] MG PACK Take 1 packet by mouth 3 (three) times daily as needed (pain).    [provider]  Buprenorphine HCl-Naloxone HCl 8-2 MG FILM Place 1 Film under the tongue in the morning, at noon, and at bedtime.    [provider]  Buprenorphine HCl-Naloxone HCl 8-2 MG FILM Place 1 Film under the tongue 2 (two) times daily. 05/27/22   Cathren Laine, MD  clindamycin (CLEOCIN T) 1 % external solution Apply 1 application topically daily as needed (irritation). 02/22/21   [provider]  EPINEPHrine 0.3 mg/0.3 mL IJ SOAJ injection Inject 0.3 mg into the muscle as needed for anaphylaxis. 10/27/20   [provider]  FEROSUL 325 (65 Fe) MG tablet Take 325 mg by mouth daily. 09/07/20   [provider]  gabapentin (NEURONTIN) 400 MG capsule Take 400 mg by mouth 3 (three) times daily. 09/29/20   [provider]  hydrochlorothiazide (HYDRODIURIL) 25 MG tablet Take 25 mg by mouth every morning. 11/25/19   [provider]  ibuprofen (ADVIL) 200 MG tablet Take 800 mg by mouth every 8 (eight) hours as needed for moderate pain. 10/10/20   [provider]  ibuprofen (ADVIL) 600 MG tablet Take 1 tablet (600 mg total) by mouth every 8 (eight) hours as needed for mild pain. 08/18/21   Wynetta Fines, MD  losartan (COZAAR) 50 MG tablet Take 50 mg by mouth daily. 10/29/19   [provider]  metFORMIN (GLUCOPHAGE) 500 MG tablet Take 500 mg by mouth daily with breakfast.    [provider]  mupirocin ointment (BACTROBAN) 2 % Apply 1 application topically daily. 03/27/21   [provider]  naproxen (NAPROSYN) 500 MG tablet Take 1 tablet (500 mg total) by mouth 2 (two) times daily. 05/03/21   Raspet, Erin K, PA-C  ondansetron (ZOFRAN-ODT) 4 MG disintegrating tablet Take 1 tablet (4 mg total) by mouth every 8 (eight) hours as needed for nausea  or vomiting. 10/19/22   Tegeler, Canary Brim, MD  oxybutynin (DITROPAN-XL) 5 MG 24 hr tablet Take 5 mg by mouth at bedtime.    [provider]  oxyCODONE (ROXICODONE) 5 MG immediate release tablet Take 1 tablet (5 mg total) by mouth every 4 (four) hours as needed for severe pain. 10/19/22   Tegeler, Canary Brim, MD  oxyCODONE-acetaminophen (PERCOCET) 5-325 MG tablet Take 1 tablet by mouth every 4 (four) hours as needed. 04/03/21   Ocie Doyne, DMD  oxyCODONE-acetaminophen (PERCOCET) 5-325 MG tablet Take 1-2 tablets by mouth every 6 (six) hours as needed. 03/17/22   Roxy Horseman, PA-C   pantoprazole (PROTONIX) 20 MG tablet Take 1 tablet (20 mg total) by mouth daily. Patient not taking: Reported on 03/29/2021 11/30/20   Rolan Bucco, MD  scopolamine (TRANSDERM-SCOP) 1 MG/3DAYS Place 1 patch (1.5 mg total) onto the skin every 3 (three) days. 05/21/22   Jacalyn Lefevre, MD  Semaglutide, 1 MG/DOSE, (OZEMPIC, 1 MG/DOSE,) 4 MG/3ML SOPN Inject 1 mg into the skin every Wednesday.    [provider]  sertraline (ZOLOFT) 50 MG tablet Take 50 mg by mouth daily.    [provider]  sucralfate (CARAFATE) 1 g tablet Take 1 tablet (1 g total) by mouth 4 (four) times daily -  with meals and at bedtime. Patient not taking: Reported on 03/29/2021 11/30/20   Rolan Bucco, MD  sulfamethoxazole-trimethoprim (BACTRIM DS) 800-160 MG tablet Take 1 tablet by mouth 2 (two) times daily. Take for 7 days, started 03/27/21    [provider]  zolpidem (AMBIEN) 10 MG tablet Take 10 mg by mouth at bedtime.    [provider]                                                                                                                                    Past Surgical History Past Surgical History:  Procedure Laterality Date   CESAREAN SECTION     HYDRADENITIS EXCISION N/A 03/04/2019   Procedure: EXCISION HIDRADENITIS RIGHT AXILLA X2 AND BACK X2;  Surgeon: Griselda Miner, MD;  Location: [facility] OR;  Service: General;  Laterality: N/A;   TONSILLECTOMY     TOOTH EXTRACTION N/A 04/03/2021   Procedure: DENTAL RESTORATION/EXTRACTIONS;  Surgeon: Ocie Doyne, DMD;  Location: [facility] OR;  Service: Oral Surgery;  Laterality: N/A;   Family History Family History  Problem Relation Age of Onset   Hypertension Mother    Diabetes Mother    Diabetes Other    Hypertension Other     Social History Social History   Tobacco Use   Smoking status: Never   Smokeless tobacco: Never  Vaping Use   Vaping status: Never Used  Substance Use Topics   Alcohol use: No   Drug use: No    Allergies Amoxicillin and Clindamycin  Review of Systems Review of Systems  Physical Exam Vital Signs  I have reviewed the triage vital  signs BP (!) 142/98 (BP Location: Right Arm)   Pulse 71   Temp (!) 97 F (36.1 C)   Resp 18   Wt 112 kg   LMP 03/09/2023   SpO2 100%   BMI 46.65 kg/m   Physical Exam Vitals reviewed.  HENT:     Head: Normocephalic.     Nose: Nose normal.  Eyes:     Pupils: Pupils are equal, round, and reactive to light.  Cardiovascular:     Rate and Rhythm: Normal rate.  Pulmonary:     Effort: Pulmonary effort is normal.  Abdominal:     General: Abdomen is flat. There is no distension.     Palpations: Abdomen is soft.     Tenderness: There is no abdominal tenderness. There is no guarding.  Musculoskeletal:        General: Normal range of motion.     Cervical back: Normal range of motion.  Skin:    General: Skin is warm and dry.  Neurological:     Mental Status: She is alert.     ED Results and Treatments Labs (all labs ordered are listed, but only abnormal results are displayed) Labs Reviewed  COMPREHENSIVE METABOLIC PANEL - Abnormal; Notable for the following components:      Result Value   Calcium 8.8 (*)    All other components within normal limits  CBC - Abnormal; Notable for the following components:   RBC 3.83 (*)    Hemoglobin 10.7 (*)    HCT 34.8 (*)    RDW 21.2 (*)    All other components within normal limits  URINALYSIS, ROUTINE W REFLEX MICROSCOPIC - Abnormal; Notable for the following components:   Specific Gravity, Urine 1.039 (*)    Hgb urine dipstick LARGE (*)    Protein, ur 30 (*)    All other components within normal limits  LIPASE, BLOOD  HCG, SERUM, QUALITATIVE                                                                                                                          Radiology CT ABDOMEN PELVIS W CONTRAST Result Date: 03/12/2023 CLINICAL DATA:  Not feeling well with diarrhea x3 days. EXAM: CT  ABDOMEN AND PELVIS WITH CONTRAST TECHNIQUE: Multidetector CT imaging of the abdomen and pelvis was performed using the standard protocol following bolus administration of intravenous contrast. RADIATION DOSE REDUCTION: This exam was performed according to the departmental dose-optimization program which includes automated exposure control, adjustment of the mA and/or kV according to patient size and/or use of iterative reconstruction technique. CONTRAST:  OMNIPAQUE IOHEXOL 300 MG/ML  SOLN COMPARISON:  October 19, 2022 FINDINGS: Lower chest: No acute abnormality. Hepatobiliary: No focal liver abnormality is seen. No gallstones, gallbladder wall thickening, or biliary dilatation. Pancreas: Unremarkable. No pancreatic ductal dilatation or surrounding inflammatory changes. Spleen: Normal in size without focal abnormality. Adrenals/Urinary Tract: Adrenal glands are unremarkable. Kidneys are normal, without renal calculi, focal lesion, or hydronephrosis. The  urinary bladder is empty and subsequently limited in evaluation. Stomach/Bowel: Surgical sutures are seen throughout the gastric region. Appendix appears normal. No evidence of bowel wall thickening, distention, or inflammatory changes. Vascular/Lymphatic: No significant vascular findings are present. No enlarged abdominal or pelvic lymph nodes. Reproductive: Uterus and bilateral adnexa are unremarkable. Other: No abdominal wall hernia or abnormality. No abdominopelvic ascites. Musculoskeletal: No acute or significant osseous findings. IMPRESSION: 1. Evidence of prior gastric surgery. 2. No acute or active process within the abdomen or pelvis. Electronically Signed   By: Aram Candela M.D.   On: 03/12/2023 21:14    Pertinent labs & imaging results that were available during my care of the patient were reviewed by me and considered in my medical decision making (see MDM for details).  Medications Ordered in ED Medications  ondansetron (ZOFRAN) tablet 4  mg (4 mg Oral Given 03/12/23 2040)  dicyclomine (BENTYL) capsule 10 mg (10 mg Oral Given 03/12/23 2040)  pantoprazole (PROTONIX) EC tablet 40 mg (40 mg Oral Given 03/12/23 2040)  iohexol (OMNIPAQUE) 300 MG/ML solution 100 mL (100 mLs Intravenous Contrast Given 03/12/23 2058)                                                                                                                                     Procedures Procedures  (including critical care time)  Medical Decision Making / ED Course   This patient presents to the ED for concern of abdominal pain and diarrhea, this involves an extensive number of treatment options, and is a complaint that carries with it a high risk of complications and morbidity.  The differential diagnosis includes medication reaction, gastritis, enteritis, less likely acute intra-abdominal process.  MDM: With the patient's history of gastric bypass, will obtain CT imaging.  Basic labs ordered.  Will try to symptomatically treat the patient.  Abdomen overall soft, lower suspicion for acute surgical process.  Based on the patient's history, I think this is likely related to the SGL 2 inhibitors that she has been taking.  Counseled the patient that perhaps she would be better off not taking these medications in the future.   Additional history obtained: -Additional history obtained from  -External records from outside source obtained and reviewed including: Chart review including previous notes, labs, imaging, consultation notes   Lab Tests: -I ordered, reviewed, and interpreted labs.   The pertinent results include:   Labs Reviewed  COMPREHENSIVE METABOLIC PANEL - Abnormal; Notable for the following components:      Result Value   Calcium 8.8 (*)    All other components within normal limits  CBC - Abnormal; Notable for the following components:   RBC 3.83 (*)    Hemoglobin 10.7 (*)    HCT 34.8 (*)    RDW 21.2 (*)    All other components within normal  limits  URINALYSIS, ROUTINE W REFLEX MICROSCOPIC - Abnormal; Notable for the following components:  Specific Gravity, Urine 1.039 (*)    Hgb urine dipstick LARGE (*)    Protein, ur 30 (*)    All other components within normal limits  LIPASE, BLOOD  HCG, SERUM, QUALITATIVE      EKG my independent review the patient's EKG shows no ST segment depressions or elevations, no T wave versions, no evidence of acute ischemia.  EKG Interpretation Date/Time:  Wednesday March 12 2023 19:35:30 EST Ventricular Rate:  60 PR Interval:  174 QRS Duration:  76 QT Interval:  398 QTC Calculation: 398 R Axis:   74  Text Interpretation: Normal sinus rhythm with sinus arrhythmia Normal ECG When compared with ECG of 19-Oct-2022 18:28, PREVIOUS ECG IS PRESENT Confirmed by Anders Simmonds [id number]) on 03/12/2023 7:41:55 PM         Imaging Studies ordered: I ordered imaging studies including CT imaging the abdomen pelvis I independently visualized and interpreted imaging. I agree with the radiologist interpretation   Medicines ordered and prescription drug management: Meds ordered this encounter  Medications   ondansetron (ZOFRAN) tablet 4 mg   dicyclomine (BENTYL) capsule 10 mg   pantoprazole (PROTONIX) EC tablet 40 mg   iohexol (OMNIPAQUE) 300 MG/ML solution 100 mL   dicyclomine (BENTYL) 20 MG tablet    Sig: Take 1 tablet (20 mg total) by mouth 2 (two) times daily.    Dispense:  20 tablet    Refill:  0   ondansetron (ZOFRAN) 4 MG tablet    Sig: Take 1 tablet (4 mg total) by mouth every 6 (six) hours.    Dispense:  12 tablet    Refill:  0    -I have reviewed the patients home medicines and have made adjustments as needed   Cardiac Monitoring: The patient was maintained on a cardiac monitor.  I personally viewed and interpreted the cardiac monitored which showed an underlying rhythm of: Normal sinus rhythm  Social Determinants of Health:  Factors impacting patients care include:  Multiple medical comorbidities including gastric bypass surgery, obesity   Reevaluation: After the interventions noted above, I reevaluated the patient and found that they have :improved  Co morbidities that complicate the patient evaluation  Past Medical History:  Diagnosis Date   Abscess    Anemia    Anxiety    Chest pain    Gonorrhea    Hypertension    Morbid obesity (HCC)    Obesity    Pre-diabetes    Urinary tract infection       Dispostion: I considered admission for this patient, however with negative imaging and normal labs believe patient is appropriate for outpatient management.     Final Clinical Impression(s) / ED Diagnoses Final diagnoses:  Diarrhea, unspecified type     @PCDICTATION @    Anders Simmonds T, DO 03/12/23 2134

## 2023-03-12 NOTE — ED Triage Notes (Signed)
Pt is here for evaluation of not feeling well, "a lot of pressure on chest and heartburn", diarrhea x3 days, itching.  Pt states that she thinks this is a side effect of the munjaro she has been taking for the past 2 months.  Pt reports that she has had up to 10 episodes of diarrhea in the past 24 hours, she has taken immodium about six times, last dose was 2 hours ago.

## 2023-03-12 NOTE — Discharge Instructions (Addendum)
While you are in the emergency department had blood work done that was normal.  You also do CT scan which did not show any abnormalities.  Like we discussed, I think that your symptoms are being caused by the SGL 2 inhibitors that you are getting injected.  I have discussed with your primary care doctor whether you should continue to take these medicines.  In the meantime, you can take Bentyl for cramping, and Zofran for nausea.  Your diarrhea should begin to resolve over the next several days as those medicines get out of your system.

## 2023-03-21 ENCOUNTER — Emergency Department (HOSPITAL_BASED_OUTPATIENT_CLINIC_OR_DEPARTMENT_OTHER)
Admission: EM | Admit: 2023-03-21 | Discharge: 2023-03-21 | Disposition: A | Discharge status: Home / Self Care | Payer: MEDICAID | Attending: Emergency Medicine | Admitting: Emergency Medicine

## 2023-03-21 ENCOUNTER — Other Ambulatory Visit: Payer: Self-pay

## 2023-03-21 DIAGNOSIS — Z760 Encounter for issue of repeat prescription: Secondary | ICD-10-CM | POA: Insufficient documentation

## 2023-03-21 DIAGNOSIS — Z794 Long term (current) use of insulin: Secondary | ICD-10-CM | POA: Insufficient documentation

## 2023-03-21 DIAGNOSIS — R630 Anorexia: Secondary | ICD-10-CM | POA: Insufficient documentation

## 2023-03-21 MED ORDER — BUPRENORPHINE HCL-NALOXONE HCL 8-2 MG SL FILM: 1.0000 | ORAL_FILM | Freq: Two times a day (BID) | SUBLINGUAL | 0 refills | Status: DC | PRN

## 2023-03-21 MED ORDER — BUPRENORPHINE HCL-NALOXONE HCL 8-2 MG SL SUBL
1.0000 | SUBLINGUAL_TABLET | Freq: Every day | SUBLINGUAL | Status: DC
  Administered 2023-03-21: 1 via SUBLINGUAL
  Filled 2023-03-21: qty 1

## 2023-03-21 NOTE — ED Provider Notes (Signed)
 [place] EMERGENCY DEPARTMENT AT [facility] Provider Note   CSN: [id number] Arrival date & time: 03/21/23  1607     History  No chief complaint on file.   Vanessa Morton is a 38 y.o. female presenting to ED requesting a refill of her Suboxone .  The patient reports that she recently misplaced her prescription, and is not able to see her doctor for another week, and is needing a temporary refill.  She has been a couple of days without her Suboxone  as she is having insomnia, feeling jittery and the loss of appetite.  She is worried about withdrawal.  I did confirm on PDMP the patient prescribed Suboxone  8 mg tablets, 2.5 tablets daily   HPI     Home Medications Prior to Admission medications   Medication Sig Start Date End Date Taking? Authorizing Provider  Buprenorphine  HCl-Naloxone  HCl (SUBOXONE ) 8-2 MG FILM Place 1 Film under the tongue 2 (two) times daily as needed for up to 20 doses. 03/21/23  Yes Asante Blanda, Donnice PARAS, MD  Aspirin-Acetaminophen -Caffeine (GOODYS EXTRA STRENGTH) 500-325-65 MG PACK Take 1 packet by mouth 3 (three) times daily as needed (pain).    [provider]  Buprenorphine  HCl-Naloxone  HCl 8-2 MG FILM Place 1 Film under the tongue in the morning, at noon, and at bedtime.    [provider]  Buprenorphine  HCl-Naloxone  HCl 8-2 MG FILM Place 1 Film under the tongue 2 (two) times daily. 05/27/22   Steinl, Kevin, MD  clindamycin (CLEOCIN T) 1 % external solution Apply 1 application topically daily as needed (irritation). 02/22/21   [provider]  dicyclomine  (BENTYL ) 20 MG tablet Take 1 tablet (20 mg total) by mouth 2 (two) times daily. 03/12/23   Mannie Pac T, DO  EPINEPHrine  0.3 mg/0.3 mL IJ SOAJ injection Inject 0.3 mg into the muscle as needed for anaphylaxis. 10/27/20   [provider]  FEROSUL 325 (65 Fe) MG tablet Take 325 mg by mouth daily. 09/07/20   [provider]  gabapentin  (NEURONTIN ) 400 MG capsule  Take 400 mg by mouth 3 (three) times daily. 09/29/20   [provider]  hydrochlorothiazide  (HYDRODIURIL ) 25 MG tablet Take 25 mg by mouth every morning. 11/25/19   [provider]  ibuprofen  (ADVIL ) 200 MG tablet Take 800 mg by mouth every 8 (eight) hours as needed for moderate pain. 10/10/20   [provider]  ibuprofen  (ADVIL ) 600 MG tablet Take 1 tablet (600 mg total) by mouth every 8 (eight) hours as needed for mild pain. 08/18/21   Laurice Maude BROCKS, MD  losartan (COZAAR) 50 MG tablet Take 50 mg by mouth daily. 10/29/19   [provider]  metFORMIN (GLUCOPHAGE) 500 MG tablet Take 500 mg by mouth daily with breakfast.    [provider]  mupirocin ointment (BACTROBAN) 2 % Apply 1 application topically daily. 03/27/21   [provider]  naproxen  (NAPROSYN ) 500 MG tablet Take 1 tablet (500 mg total) by mouth 2 (two) times daily. 05/03/21   Raspet, Erin K, PA-C  ondansetron  (ZOFRAN ) 4 MG tablet Take 1 tablet (4 mg total) by mouth every 6 (six) hours. 03/12/23   Mannie Pac T, DO  ondansetron  (ZOFRAN -ODT) 4 MG disintegrating tablet Take 1 tablet (4 mg total) by mouth every 8 (eight) hours as needed for nausea or vomiting. 10/19/22   Tegeler, Lonni PARAS, MD  oxybutynin (DITROPAN-XL) 5 MG 24 hr tablet Take 5 mg by mouth at bedtime.    [provider]  oxyCODONE  (ROXICODONE ) 5 MG immediate release tablet Take 1 tablet (5 mg total) by mouth every 4 (four) hours as needed for severe pain. 10/19/22   Tegeler, Lonni PARAS, MD  oxyCODONE -acetaminophen  (PERCOCET) 5-325 MG tablet Take 1 tablet by mouth every 4 (four) hours as needed. 04/03/21   Sheryle Hamilton, DMD  oxyCODONE -acetaminophen  (PERCOCET) 5-325 MG tablet Take 1-2 tablets by mouth every 6 (six) hours as needed. 03/17/22   Vicky Charleston, PA-C  pantoprazole  (PROTONIX ) 20 MG tablet Take 1 tablet (20 mg total) by mouth daily. Patient not taking: Reported on 03/29/2021 11/30/20   Lenor Hollering, MD   scopolamine  (TRANSDERM-SCOP) 1 MG/3DAYS Place 1 patch (1.5 mg total) onto the skin every 3 (three) days. 05/21/22   Haviland, Julie, MD  Semaglutide, 1 MG/DOSE, (OZEMPIC, 1 MG/DOSE,) 4 MG/3ML SOPN Inject 1 mg into the skin every Wednesday.    [provider]  sertraline (ZOLOFT) 50 MG tablet Take 50 mg by mouth daily.    [provider]  sucralfate  (CARAFATE ) 1 g tablet Take 1 tablet (1 g total) by mouth 4 (four) times daily -  with meals and at bedtime. Patient not taking: Reported on 03/29/2021 11/30/20   Lenor Hollering, MD  sulfamethoxazole -trimethoprim  (BACTRIM  DS) 800-160 MG tablet Take 1 tablet by mouth 2 (two) times daily. Take for 7 days, started 03/27/21    [provider]  zolpidem (AMBIEN) 10 MG tablet Take 10 mg by mouth at bedtime.    [provider]      Allergies    Amoxicillin  and Clindamycin    Review of Systems   Review of Systems  Physical Exam Updated Vital Signs BP (!) 149/101   Pulse 90   Temp 98 F (36.7 C) (Temporal)   Resp 18   Ht 5' 1 (1.549 m)   Wt 106.6 kg   LMP 03/09/2023   SpO2 100%   BMI 44.40 kg/m  Physical Exam Constitutional:      General: She is not in acute distress. HENT:     Head: Normocephalic and atraumatic.  Eyes:     Conjunctiva/sclera: Conjunctivae normal.     Pupils: Pupils are equal, round, and reactive to light.  Cardiovascular:     Rate and Rhythm: Normal rate and regular rhythm.  Pulmonary:     Effort: Pulmonary effort is normal. No respiratory distress.  Skin:    General: Skin is warm and dry.  Neurological:     General: No focal deficit present.     Mental Status: She is alert. Mental status is at baseline.  Psychiatric:        Mood and Affect: Mood normal.        Behavior: Behavior normal.     ED Results / Procedures / Treatments   Labs (all labs ordered are listed, but only abnormal results are displayed) Labs Reviewed - No data to display  EKG None  Radiology No results  found.  Procedures Procedures    Medications Ordered in ED Medications  buprenorphine -naloxone  (SUBOXONE ) 8-2 mg per SL tablet 1 tablet (has no administration in time range)    ED Course/ Medical Decision Making/ A&P                                 Medical Decision Making Risk Prescription drug management.   Patient is here for refill of Suboxone , reporting withdrawal type symptoms.  She is clinically well-appearing on exam, some mild  hypertension.  I will provide her a 1 week course of Suboxone  until she can follow-up with her prescribing physician.  No further workup is required in the ED.  I did review external records including PDMP.  She will be given 1 dose of Suboxone  for the evening and a prescription was sent to her pharmacy.  She verbalized understanding and is content with the plan.        Final Clinical Impression(s) / ED Diagnoses Final diagnoses:  Medication refill    Rx / DC Orders ED Discharge Orders          Ordered    Buprenorphine  HCl-Naloxone  HCl (SUBOXONE ) 8-2 MG FILM  2 times daily PRN        03/21/23 1903              Cottie Donnice PARAS, MD 03/21/23 1905

## 2023-03-21 NOTE — ED Triage Notes (Addendum)
 Pt POV from home requesting suboxone prescription, is out of town and does not have meds, states she is starting to have withdrawal sx (insomnia, jittery, loss of appetite etc.) No tremors or acute distress noted in triage.

## 2023-03-21 NOTE — ED Notes (Signed)
 RN reviewed discharge instructions with pt. Pt verbalized understanding and had no further questions. VSS upon discharge.

## 2023-05-06 ENCOUNTER — Ambulatory Visit: Payer: MEDICAID | Attending: Nurse Practitioner | Admitting: Physical Therapy

## 2023-07-10 ENCOUNTER — Other Ambulatory Visit: Payer: Self-pay

## 2023-07-10 ENCOUNTER — Encounter: Payer: Self-pay | Admitting: Physical Therapy

## 2023-07-10 ENCOUNTER — Ambulatory Visit: Payer: MEDICAID | Attending: Nurse Practitioner | Admitting: Physical Therapy

## 2023-07-10 DIAGNOSIS — R293 Abnormal posture: Secondary | ICD-10-CM | POA: Diagnosis present

## 2023-07-10 DIAGNOSIS — M6281 Muscle weakness (generalized): Secondary | ICD-10-CM | POA: Insufficient documentation

## 2023-07-10 DIAGNOSIS — R279 Unspecified lack of coordination: Secondary | ICD-10-CM | POA: Insufficient documentation
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
## 2023-07-15 ENCOUNTER — Ambulatory Visit: Payer: MEDICAID

## 2023-07-22 ENCOUNTER — Ambulatory Visit: Payer: MEDICAID | Attending: Nurse Practitioner

## 2023-07-22 DIAGNOSIS — R279 Unspecified lack of coordination: Secondary | ICD-10-CM | POA: Diagnosis present

## 2023-07-22 DIAGNOSIS — M6281 Muscle weakness (generalized): Secondary | ICD-10-CM | POA: Insufficient documentation

## 2023-07-22 DIAGNOSIS — M62838 Other muscle spasm: Secondary | ICD-10-CM | POA: Insufficient documentation

## 2023-07-22 DIAGNOSIS — R293 Abnormal posture: Secondary | ICD-10-CM | POA: Insufficient documentation
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
## 2023-07-22 NOTE — Patient Instructions (Signed)
 Squatty potty: When your knees are level or below the level of your hips, pelvic floor muscles are pressed against rectum, preventing ease of bowel movement. By getting knees above the level of the hips, these pelvic floor muscles relax, allowing easier passage of bowel movement. ? Ways to get knees above hips: o Squatty Potty (7inch and 9inch versions) o Small stool o Roll of toilet paper under each foot o Hardback book or stack of magazines under each foot  Relaxed Toileting mechanics: Once in this position, make sure to lean forward with forearms on thighs, wide knees, relaxed stomach, and breathe.    Bowel massage: To assist with more regular and more comfortable bowel movements, try performing bowel massage nightly for 5-10 minutes. Place hands in the lower right side of your abdomen to start; in small circles, massage up, across, and down the left side of your abdomen. Pressure does not need to be hard, but just comfortable. You can use lotion or oil to make more comfortable.    Urge Incontinence  Ideal urination frequency is every 2-4 wakeful hours, which equates to 5-8 times within a 24-hour period.   Urge incontinence is leakage that occurs when the bladder muscle contracts, creating a sudden need to go before getting to the bathroom.   Going too often when your bladder isn't actually full can disrupt the body's automatic signals to store and hold urine longer, which will increase urgency/frequency.  In this case, the bladder "is running the show" and strategies can be learned to retrain this pattern.   One should be able to control the first urge to urinate, at around .  The bladder can hold up to a "grande latte," or . To help you gain control, practice the Urge Drill below when urgency strikes.  This drill will help retrain your bladder signals and allow you to store and hold urine longer.  The overall goal is to stretch out your time between voids to reach a  more manageable voiding schedule.    Practice your "quick flicks" often throughout the day (each waking hour) even when you don't need feel the urge to go.  This will help strengthen your pelvic floor muscles, making them more effective in controlling leakage.  Urge Drill  When you feel an urge to go, follow these steps to regain control: Stop what you are doing and be still Take one deep breath, directing your air into your abdomen Think an affirming thought, such as "I've got this." Do 5 quick flicks of your pelvic floor Walk with control to the bathroom to void, or delay voiding     [facility] [address], [address] [place], [place] [address] Phone # [phone] Fax [phone]

## 2023-09-01 ENCOUNTER — Ambulatory Visit: Payer: MEDICAID

## 2023-09-04 ENCOUNTER — Ambulatory Visit: Payer: MEDICAID | Attending: Nurse Practitioner

## 2023-09-04 DIAGNOSIS — R293 Abnormal posture: Secondary | ICD-10-CM | POA: Insufficient documentation

## 2023-09-04 DIAGNOSIS — M62838 Other muscle spasm: Secondary | ICD-10-CM | POA: Insufficient documentation

## 2023-09-04 DIAGNOSIS — R279 Unspecified lack of coordination: Secondary | ICD-10-CM | POA: Diagnosis present

## 2023-09-04 DIAGNOSIS — M6281 Muscle weakness (generalized): Secondary | ICD-10-CM | POA: Insufficient documentation

## 2023-09-04 NOTE — Patient Instructions (Addendum)
 Urge Incontinence  Ideal urination frequency is every 2-4 wakeful hours, which equates to 5-8 times within a 24-hour period.   Urge incontinence is leakage that occurs when the bladder muscle contracts, creating a sudden need to go before getting to the bathroom.   Going too often when your bladder isn't actually full can disrupt the body's automatic signals to store and hold urine longer, which will increase urgency/frequency.  In this case, the bladder "is running the show" and strategies can be learned to retrain this pattern.   One should be able to control the first urge to urinate, at around .  The bladder can hold up to a "grande latte," or . To help you gain control, practice the Urge Drill below when urgency strikes.  This drill will help retrain your bladder signals and allow you to store and hold urine longer.  The overall goal is to stretch out your time between voids to reach a more manageable voiding schedule.    Practice your quick flicks often throughout the day (each waking hour) even when you don't need feel the urge to go.  This will help strengthen your pelvic floor muscles, making them more effective in controlling leakage.  Urge Drill  When you feel an urge to go, follow these steps to regain control: Stop what you are doing and be still Take one deep breath, directing your air into your abdomen Think an affirming thought, such as "I've got this." Do 5 quick flicks of your pelvic floor Walk with control to the bathroom to void, or delay voiding      Voiding schedule: try to go at least 2 hours between urinating; don't go over 3 hours.    [facility] [address] [place], [place] [address] Phone # [phone] Fax [phone]
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
## 2023-09-08 ENCOUNTER — Ambulatory Visit: Payer: MEDICAID

## 2023-09-08 DIAGNOSIS — M6281 Muscle weakness (generalized): Secondary | ICD-10-CM | POA: Diagnosis not present

## 2023-09-08 DIAGNOSIS — R279 Unspecified lack of coordination: Secondary | ICD-10-CM

## 2023-09-08 DIAGNOSIS — R293 Abnormal posture: Secondary | ICD-10-CM

## 2023-09-08 DIAGNOSIS — M62838 Other muscle spasm: Secondary | ICD-10-CM
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
## 2023-09-15 ENCOUNTER — Emergency Department (HOSPITAL_BASED_OUTPATIENT_CLINIC_OR_DEPARTMENT_OTHER)
Admission: EM | Admit: 2023-09-15 | Discharge: 2023-09-15 | Disposition: A | Discharge status: Home / Self Care | Payer: MEDICAID | Attending: Emergency Medicine | Admitting: Emergency Medicine

## 2023-09-15 ENCOUNTER — Other Ambulatory Visit: Payer: Self-pay

## 2023-09-15 DIAGNOSIS — K0889 Other specified disorders of teeth and supporting structures: Secondary | ICD-10-CM | POA: Diagnosis present

## 2023-09-15 DIAGNOSIS — K029 Dental caries, unspecified: Secondary | ICD-10-CM | POA: Insufficient documentation

## 2023-09-15 MED ORDER — LEVOFLOXACIN 500 MG PO TABS: 500.0000 mg | ORAL_TABLET | Freq: Every day | ORAL | 0 refills | Status: DC

## 2023-09-15 MED ORDER — METRONIDAZOLE 500 MG PO TABS: 500.0000 mg | ORAL_TABLET | Freq: Three times a day (TID) | ORAL | 0 refills | Status: AC

## 2023-09-15 MED ORDER — BUPRENORPHINE HCL-NALOXONE HCL 8-2 MG SL FILM: 1.0000 | ORAL_FILM | Freq: Two times a day (BID) | SUBLINGUAL | 0 refills | Status: AC

## 2023-09-15 MED ORDER — CEFDINIR 300 MG PO CAPS: 300.0000 mg | ORAL_CAPSULE | Freq: Two times a day (BID) | ORAL | 0 refills | Status: DC

## 2023-09-15 MED ORDER — LEVOFLOXACIN 750 MG PO TABS: 750.0000 mg | ORAL_TABLET | Freq: Every day | ORAL | 0 refills | Status: AC

## 2023-09-15 NOTE — ED Provider Notes (Addendum)
  EMERGENCY DEPARTMENT AT [facility] Provider Note   CSN: [id number] Arrival date & time: 09/15/23  0106     Patient presents with: Dental Pain   Vanessa Morton is a 38 y.o. female.    Dental Pain    38 year old female presenting to the emergency department with dental pain.  The patient is on Suboxone  for chronic pain management.  On review of the patient's PDMP she last filled that medication at the beginning of June.  She states that she plans to follow-up on Thursday of this upcoming week for her July prescription.  She states that she has chronic dental pain which has worsened over the last several weeks prompting follow-up with an oral surgeon.  She states that she has exposed nerve roots.  She has had some swelling in her lymph nodes.  No fevers.  She has been prescribed amoxicillin  in the past and had an allergic reaction as well as clindamycin in the packs and had an allergic reaction.  She presents primarily for pain management the setting of acute on chronic bilateral dental pain.  She plans to follow-up with an oral surgeon outpatient.  She has a Education officer, community.  Prior to Admission medications   Medication Sig Start Date End Date Taking? Authorizing Provider  levofloxacin (LEVAQUIN) 750 MG tablet Take 1 tablet (750 mg total) by mouth daily for 7 days. 09/15/23 09/22/23 Yes Jerrol Agent, MD  metroNIDAZOLE  (FLAGYL ) 500 MG tablet Take 1 tablet (500 mg total) by mouth 3 (three) times daily for 7 days. 09/15/23 09/22/23 Yes Jerrol Agent, MD  Aspirin-Acetaminophen -Caffeine (GOODYS EXTRA STRENGTH) 500-325-65 MG PACK Take 1 packet by mouth 3 (three) times daily as needed (pain).    [provider]  Buprenorphine  HCl-Naloxone  HCl 8-2 MG FILM Place 1 Film under the tongue 2 (two) times daily for 4 days. 09/15/23 09/19/23  Jerrol Agent, MD  clindamycin (CLEOCIN T) 1 % external solution Apply 1 application topically daily as needed (irritation). 02/22/21   [provider]  dicyclomine  (BENTYL ) 20 MG tablet Take 1 tablet (20 mg total) by mouth 2 (two) times daily. 03/12/23   Mannie Fairy DASEN, DO  EPINEPHrine  0.3 mg/0.3 mL IJ SOAJ injection Inject 0.3 mg into the muscle as needed for anaphylaxis. 10/27/20   [provider]  FEROSUL 325 (65 Fe) MG tablet Take 325 mg by mouth daily. 09/07/20   [provider]  gabapentin  (NEURONTIN ) 400 MG capsule Take 400 mg by mouth 3 (three) times daily. 09/29/20   [provider]  hydrochlorothiazide  (HYDRODIURIL ) 25 MG tablet Take 25 mg by mouth every morning. 11/25/19   [provider]  ibuprofen  (ADVIL ) 200 MG tablet Take 800 mg by mouth every 8 (eight) hours as needed for moderate pain. 10/10/20   [provider]  ibuprofen  (ADVIL ) 600 MG tablet Take 1 tablet (600 mg total) by mouth every 8 (eight) hours as needed for mild pain. 08/18/21   Laurice Maude BROCKS, MD  losartan (COZAAR) 50 MG tablet Take 50 mg by mouth daily. 10/29/19   [provider]  metFORMIN (GLUCOPHAGE) 500 MG tablet Take 500 mg by mouth daily with breakfast.    [provider]  mupirocin ointment (BACTROBAN) 2 % Apply 1 application topically daily. 03/27/21   [provider]  naproxen  (NAPROSYN ) 500 MG tablet Take 1 tablet (500 mg total) by mouth 2 (two) times daily. 05/03/21   Raspet, Erin K, PA-C  ondansetron  (ZOFRAN ) 4 MG tablet Take 1 tablet (4  mg total) by mouth every 6 (six) hours. 03/12/23   Mannie Pac T, DO  ondansetron  (ZOFRAN -ODT) 4 MG disintegrating tablet Take 1 tablet (4 mg total) by mouth every 8 (eight) hours as needed for nausea or vomiting. 10/19/22   Tegeler, Lonni PARAS, MD  oxybutynin (DITROPAN-XL) 5 MG 24 hr tablet Take 5 mg by mouth at bedtime.    [provider]  pantoprazole  (PROTONIX ) 20 MG tablet Take 1 tablet (20 mg total) by mouth daily. Patient not taking: Reported on 03/29/2021 11/30/20   Lenor Hollering, MD  scopolamine  (TRANSDERM-SCOP) 1 MG/3DAYS  Place 1 patch (1.5 mg total) onto the skin every 3 (three) days. 05/21/22   Haviland, Julie, MD  Semaglutide, 1 MG/DOSE, (OZEMPIC, 1 MG/DOSE,) 4 MG/3ML SOPN Inject 1 mg into the skin every Wednesday.    [provider]  sertraline (ZOLOFT) 50 MG tablet Take 50 mg by mouth daily.    [provider]  sucralfate  (CARAFATE ) 1 g tablet Take 1 tablet (1 g total) by mouth 4 (four) times daily -  with meals and at bedtime. Patient not taking: Reported on 03/29/2021 11/30/20   Lenor Hollering, MD  zolpidem (AMBIEN) 10 MG tablet Take 10 mg by mouth at bedtime.    [provider]    Allergies: Amoxicillin  and Clindamycin    Review of Systems  All other systems reviewed and are negative.   Updated Vital Signs BP 128/84   Pulse 94   Temp 98.1 F (36.7 C) (Oral)   Resp 19   Ht 5' 1 (1.549 m)   Wt 102.1 kg   LMP 08/25/2023 (Approximate)   SpO2 100%   BMI 42.51 kg/m   Physical Exam Vitals and nursing note reviewed.  Constitutional:      General: She is not in acute distress.    Appearance: She is obese.  HENT:     Head: Normocephalic and atraumatic.     Comments: Poor dentition throughout, no palpable oral swelling to suggest odontogenic abscess, mild left-sided lymphadenopathy  Eyes:     Conjunctiva/sclera: Conjunctivae normal.     Pupils: Pupils are equal, round, and reactive to light.    Cardiovascular:     Rate and Rhythm: Normal rate and regular rhythm.  Pulmonary:     Effort: Pulmonary effort is normal. No respiratory distress.  Abdominal:     General: There is no distension.     Tenderness: There is no guarding.   Musculoskeletal:        General: No deformity or signs of injury.     Cervical back: Neck supple.   Skin:    Findings: No lesion or rash.   Neurological:     General: No focal deficit present.     Mental Status: She is alert. Mental status is at baseline.     (all labs ordered are listed, but only abnormal results are  displayed) Labs Reviewed - No data to display  EKG: None  Radiology: No results found.   Procedures   Medications Ordered in the ED - No data to display                                  Medical Decision Making Risk Prescription drug management.    38 year old female presenting to the emergency department with dental pain.  The patient is on Suboxone  for chronic pain management.  On review of the patient's PDMP she last filled  that medication at the beginning of June.  She states that she plans to follow-up on Thursday of this upcoming week for her July prescription.  She states that she has chronic dental pain which has worsened over the last several weeks prompting follow-up with an oral surgeon.  She states that she has exposed nerve roots.  She has had some swelling in her lymph nodes.  No fevers.  She has been prescribed amoxicillin  in the past and had an allergic reaction as well as clindamycin in the packs and had an allergic reaction.  She presents primarily for pain management the setting of acute on chronic bilateral dental pain.  She plans to follow-up with an oral surgeon outpatient.  She has a Education officer, community.  On arrival, the patient was vitally stable.  On exam, the patient had no trismus, no obvious odontogenic abscess palpated.  Some mild lymphadenopathy on the left, poor dentition noted throughout.  Patient presenting with acute on chronic dental pain, requesting pain management as well as antibiotics.  Given her previous sensitivities, on chart review the patient has tolerated Keflex  in the past. Pt uncertain if she has had a reaction to cephalosporins.  Will prescribe a combination therapy regimen of Levaquin and Flagyl .  Patient was advised to not drink alcohol while taking Flagyl .  She is in the process of coordinating outpatient follow-up with an oral surgeon and has an outpatient dentist.  She also has outpatient pain management for which she has follow-up in the next few days.   She states that she does not have immediate access to her Suboxone  as she is currently in [place] and her medication is over 3 hours away.  She is requesting a short course of the medication until she can get follow-up with her pain specialist.  Patient overall vitally stable, no evidence of abscess requiring immediate drainage, will empirically cover with antibiotics and provide the patient a short course of Suboxone  for pain management, stable for discharge at this time.  Turn precautions provided.     Final diagnoses:  Pain due to dental caries    ED Discharge Orders          Ordered    Buprenorphine  HCl-Naloxone  HCl 8-2 MG FILM  2 times daily        09/15/23 0131    cefdinir (OMNICEF) 300 MG capsule  2 times daily,   Status:  Discontinued        09/15/23 0137    metroNIDAZOLE  (FLAGYL ) 500 MG tablet  3 times daily        09/15/23 0137    levofloxacin (LEVAQUIN) 500 MG tablet  Daily,   Status:  Discontinued        09/15/23 0150    levofloxacin (LEVAQUIN) 750 MG tablet  Daily        09/15/23 0153               Jerrol Agent, MD 09/15/23 0145    Jerrol Agent, MD 09/15/23 [phone]

## 2023-09-15 NOTE — ED Triage Notes (Signed)
 Pt POV reporting bilateral dental pain due to cavities, waiting on insurance to approve surgery, was prescribed abx but reports reaction, does not know which abx she was prescribed.

## 2023-09-15 NOTE — Discharge Instructions (Addendum)
 Given your antibiotic allergies, you have been prescribed a dual regimen of Omnicef and Flagyl .  Do not drink alcohol while taking Flagyl  as this can cause a bad reaction.  Please follow-up with your dentist and oral surgeon regarding your ongoing dental pain.  Return for worsening facial pain and swelling, difficulty swallowing, any concern for allergic reaction to your medications.  Short course of your Suboxone  has been prescribed until you can obtain follow-up later this week

## 2023-09-16 ENCOUNTER — Ambulatory Visit: Payer: MEDICAID | Attending: Nurse Practitioner

## 2023-09-16 DIAGNOSIS — M62838 Other muscle spasm: Secondary | ICD-10-CM | POA: Insufficient documentation

## 2023-09-16 DIAGNOSIS — R279 Unspecified lack of coordination: Secondary | ICD-10-CM | POA: Insufficient documentation

## 2023-09-16 DIAGNOSIS — R293 Abnormal posture: Secondary | ICD-10-CM | POA: Diagnosis present

## 2023-09-16 DIAGNOSIS — M6281 Muscle weakness (generalized): Secondary | ICD-10-CM | POA: Insufficient documentation
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
## 2023-09-23 ENCOUNTER — Ambulatory Visit: Payer: MEDICAID

## 2023-09-24 ENCOUNTER — Ambulatory Visit: Payer: MEDICAID

## 2023-09-24 DIAGNOSIS — M6281 Muscle weakness (generalized): Secondary | ICD-10-CM | POA: Diagnosis not present

## 2023-09-24 DIAGNOSIS — R293 Abnormal posture: Secondary | ICD-10-CM

## 2023-09-24 DIAGNOSIS — M62838 Other muscle spasm: Secondary | ICD-10-CM

## 2023-09-24 DIAGNOSIS — R279 Unspecified lack of coordination: Secondary | ICD-10-CM
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
## 2023-09-30 ENCOUNTER — Ambulatory Visit: Payer: MEDICAID

## 2023-09-30 DIAGNOSIS — M6281 Muscle weakness (generalized): Secondary | ICD-10-CM | POA: Diagnosis not present

## 2023-09-30 DIAGNOSIS — R279 Unspecified lack of coordination: Secondary | ICD-10-CM

## 2023-09-30 DIAGNOSIS — R293 Abnormal posture: Secondary | ICD-10-CM

## 2023-09-30 DIAGNOSIS — M62838 Other muscle spasm: Secondary | ICD-10-CM
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
## 2023-10-02 ENCOUNTER — Other Ambulatory Visit: Payer: Self-pay

## 2023-10-02 ENCOUNTER — Encounter (HOSPITAL_BASED_OUTPATIENT_CLINIC_OR_DEPARTMENT_OTHER): Payer: Self-pay | Admitting: *Deleted

## 2023-10-02 ENCOUNTER — Emergency Department (HOSPITAL_BASED_OUTPATIENT_CLINIC_OR_DEPARTMENT_OTHER)
Admission: EM | Admit: 2023-10-02 | Discharge: 2023-10-03 | Disposition: A | Discharge status: Home / Self Care | Payer: MEDICAID | Attending: Emergency Medicine | Admitting: Emergency Medicine

## 2023-10-02 DIAGNOSIS — Z79899 Other long term (current) drug therapy: Secondary | ICD-10-CM | POA: Insufficient documentation

## 2023-10-02 DIAGNOSIS — I10 Essential (primary) hypertension: Secondary | ICD-10-CM | POA: Diagnosis not present

## 2023-10-02 DIAGNOSIS — Z7982 Long term (current) use of aspirin: Secondary | ICD-10-CM | POA: Insufficient documentation

## 2023-10-02 DIAGNOSIS — J029 Acute pharyngitis, unspecified: Secondary | ICD-10-CM | POA: Insufficient documentation

## 2023-10-02 LAB — RESP PANEL BY RT-PCR (RSV, FLU A&B, COVID)  RVPGX2
Influenza A by PCR: NEGATIVE
Influenza B by PCR: NEGATIVE
Resp Syncytial Virus by PCR: NEGATIVE
SARS Coronavirus 2 by RT PCR: NEGATIVE

## 2023-10-02 LAB — COMPREHENSIVE METABOLIC PANEL WITH GFR
ALT: 18 U/L (ref 0–44)
AST: 25 U/L (ref 15–41)
Albumin: 4.2 g/dL (ref 3.5–5.0)
Alkaline Phosphatase: 72 U/L (ref 38–126)
Anion gap: 8 (ref 5–15)
BUN: 14 mg/dL (ref 6–20)
CO2: 25 mmol/L (ref 22–32)
Calcium: 8.9 mg/dL (ref 8.9–10.3)
Chloride: 106 mmol/L (ref 98–111)
Creatinine, Ser: 0.73 mg/dL (ref 0.44–1.00)
GFR, Estimated: >60 mL/min (ref >60)
Glucose, Bld: 110 mg/dL — ABNORMAL HIGH (ref 70–99)
Potassium: 4.3 mmol/L (ref 3.5–5.1)
Sodium: 139 mmol/L (ref 135–145)
Total Bilirubin: 0.2 mg/dL (ref 0.0–1.2)
Total Protein: 7.1 g/dL (ref 6.5–8.1)

## 2023-10-02 LAB — CBC
HCT: 34 % — ABNORMAL LOW (ref 36.0–46.0)
Hemoglobin: 10.7 g/dL — ABNORMAL LOW (ref 12.0–15.0)
MCH: 31.3 pg (ref 26.0–34.0)
MCHC: 31.5 g/dL (ref 30.0–36.0)
MCV: 99.4 fL (ref 80.0–100.0)
Platelets: 248 K/uL (ref 150–400)
RBC: 3.42 MIL/uL — ABNORMAL LOW (ref 3.87–5.11)
RDW: 13.4 % (ref 11.5–15.5)
WBC: 8.1 K/uL (ref 4.0–10.5)
nRBC: 0 % (ref 0.0–0.2)

## 2023-10-02 LAB — GROUP A STREP BY PCR: Group A Strep by PCR: NOT DETECTED

## 2023-10-02 MED ORDER — KETOROLAC TROMETHAMINE 30 MG/ML IJ SOLN
30.0000 mg | Freq: Once | INTRAMUSCULAR | Status: DC
  Filled 2023-10-02: qty 1

## 2023-10-02 MED ORDER — DEXAMETHASONE SODIUM PHOSPHATE 10 MG/ML IJ SOLN: 10.0000 mg | Freq: Once | INTRAMUSCULAR | Status: DC

## 2023-10-02 NOTE — ED Triage Notes (Signed)
 Pt to ED reporting sore throat since having been seen for dental pain. Pateint given three antibiotics but had a reaction to one of the three and stopped taking them. Patient reporting swelling around her face and jaw.   Pt also reporting generalized body aches after recent falls due to weakness. States, I just wanted to check that I didn't break anything

## 2023-10-03 MED ORDER — IBUPROFEN 400 MG PO TABS
600.0000 mg | ORAL_TABLET | Freq: Once | ORAL | Status: AC
  Administered 2023-10-03: 600 mg via ORAL
  Filled 2023-10-03: qty 1

## 2023-10-03 MED ORDER — IBUPROFEN 600 MG PO TABS: 600.0000 mg | ORAL_TABLET | Freq: Four times a day (QID) | ORAL | 0 refills | Status: AC | PRN

## 2023-10-03 NOTE — Discharge Instructions (Signed)
 You were seen today for sore throat and chills.  Your workup today is reassuring including negative strep testing and flu and COVID testing.  Your basic labs are reassuring.  Take ibuprofen  as needed for pain.

## 2023-10-03 NOTE — ED Notes (Signed)
 Dc instructions given, pt verbalized understanding. Out of ED with all belongings and paperwork with steady gait not in visible distress.

## 2023-10-03 NOTE — ED Provider Notes (Signed)
 [place] EMERGENCY DEPARTMENT AT [facility] Provider Note   CSN: [id number] Arrival date & time: 10/02/23  2235     Patient presents with: Fall and Sore Throat   Vanessa Morton is a 38 y.o. female.  {Add pertinent medical, surgical, social history, OB history to HPI:32947} HPI     Prior to Admission medications   Medication Sig Start Date End Date Taking? Authorizing Provider  Aspirin-Acetaminophen -Caffeine (GOODYS EXTRA STRENGTH) 500-325-65 MG PACK Take 1 packet by mouth 3 (three) times daily as needed (pain).    [provider]  clindamycin (CLEOCIN T) 1 % external solution Apply 1 application topically daily as needed (irritation). 02/22/21   [provider]  dicyclomine  (BENTYL ) 20 MG tablet Take 1 tablet (20 mg total) by mouth 2 (two) times daily. 03/12/23   Mannie Pac T, DO  EPINEPHrine  0.3 mg/0.3 mL IJ SOAJ injection Inject 0.3 mg into the muscle as needed for anaphylaxis. 10/27/20   [provider]  FEROSUL 325 (65 Fe) MG tablet Take 325 mg by mouth daily. 09/07/20   [provider]  gabapentin  (NEURONTIN ) 400 MG capsule Take 400 mg by mouth 3 (three) times daily. 09/29/20   [provider]  hydrochlorothiazide  (HYDRODIURIL ) 25 MG tablet Take 25 mg by mouth every morning. 11/25/19   [provider]  ibuprofen  (ADVIL ) 200 MG tablet Take 800 mg by mouth every 8 (eight) hours as needed for moderate pain. 10/10/20   [provider]  ibuprofen  (ADVIL ) 600 MG tablet Take 1 tablet (600 mg total) by mouth every 8 (eight) hours as needed for mild pain. 08/18/21   Laurice Maude BROCKS, MD  losartan (COZAAR) 50 MG tablet Take 50 mg by mouth daily. 10/29/19   [provider]  metFORMIN (GLUCOPHAGE) 500 MG tablet Take 500 mg by mouth daily with breakfast.    [provider]  mupirocin ointment (BACTROBAN) 2 % Apply 1 application topically daily. 03/27/21   [provider]  naproxen  (NAPROSYN ) 500  MG tablet Take 1 tablet (500 mg total) by mouth 2 (two) times daily. 05/03/21   Raspet, Erin K, PA-C  ondansetron  (ZOFRAN ) 4 MG tablet Take 1 tablet (4 mg total) by mouth every 6 (six) hours. 03/12/23   Mannie Pac T, DO  ondansetron  (ZOFRAN -ODT) 4 MG disintegrating tablet Take 1 tablet (4 mg total) by mouth every 8 (eight) hours as needed for nausea or vomiting. 10/19/22   Tegeler, Lonni PARAS, MD  oxybutynin (DITROPAN-XL) 5 MG 24 hr tablet Take 5 mg by mouth at bedtime.    [provider]  pantoprazole  (PROTONIX ) 20 MG tablet Take 1 tablet (20 mg total) by mouth daily. Patient not taking: Reported on 03/29/2021 11/30/20   Lenor Hollering, MD  scopolamine  (TRANSDERM-SCOP) 1 MG/3DAYS Place 1 patch (1.5 mg total) onto the skin every 3 (three) days. 05/21/22   Haviland, Julie, MD  Semaglutide, 1 MG/DOSE, (OZEMPIC, 1 MG/DOSE,) 4 MG/3ML SOPN Inject 1 mg into the skin every Wednesday.    [provider]  sertraline (ZOLOFT) 50 MG tablet Take 50 mg by mouth daily.    [provider]  sucralfate  (CARAFATE ) 1 g tablet Take 1 tablet (1 g total) by mouth 4 (four) times daily -  with meals and at bedtime. Patient not taking: Reported on 03/29/2021 11/30/20   Lenor Hollering, MD  zolpidem (AMBIEN) 10 MG tablet Take 10 mg by mouth at bedtime.    [provider]    Allergies: Amoxicillin  and Clindamycin  Review of Systems  Updated Vital Signs BP 129/78 (BP Location: Right Arm)   Pulse 67   Temp 97.8 F (36.6 C)   Resp 18   LMP 09/29/2023 (Approximate)   SpO2 100%   Physical Exam  (all labs ordered are listed, but only abnormal results are displayed) Labs Reviewed  COMPREHENSIVE METABOLIC PANEL WITH GFR - Abnormal; Notable for the following components:      Result Value   Glucose, Bld 110 (*)    All other components within normal limits  CBC - Abnormal; Notable for the following components:   RBC 3.42 (*)    Hemoglobin 10.7 (*)    HCT 34.0 (*)    All other  components within normal limits  GROUP A STREP BY PCR  RESP PANEL BY RT-PCR (RSV, FLU A&B, COVID)  [id number]    EKG: None  Radiology: No results found.  {Document cardiac monitor, telemetry assessment procedure when appropriate:32947} Procedures   Medications Ordered in the ED  ketorolac  (TORADOL ) 30 MG/ML injection 30 mg (has no administration in time range)      {Click here for ABCD2, HEART and other calculators REFRESH Note before signing:1}                              Medical Decision Making Amount and/or Complexity of Data Reviewed Labs: ordered.  Risk Prescription drug management.   ***  {Document critical care time when appropriate  Document review of labs and clinical decision tools ie CHADS2VASC2, etc  Document your independent review of radiology images and any outside records  Document your discussion with family members, caretakers and with consultants  Document social determinants of health affecting pt's care  Document your decision making why or why not admission, treatments were needed:32947:::1}   Final diagnoses:  None    ED Discharge Orders     None

## 2023-10-04 ENCOUNTER — Emergency Department (HOSPITAL_COMMUNITY): Payer: MEDICAID

## 2023-10-04 ENCOUNTER — Emergency Department (HOSPITAL_COMMUNITY)
Admission: EM | Admit: 2023-10-04 | Discharge: 2023-10-04 | Disposition: A | Discharge status: Home / Self Care | Payer: MEDICAID | Attending: Emergency Medicine | Admitting: Emergency Medicine

## 2023-10-04 ENCOUNTER — Other Ambulatory Visit: Payer: Self-pay

## 2023-10-04 ENCOUNTER — Encounter (HOSPITAL_COMMUNITY): Payer: Self-pay

## 2023-10-04 DIAGNOSIS — W19XXXA Unspecified fall, initial encounter: Secondary | ICD-10-CM

## 2023-10-04 DIAGNOSIS — Z7982 Long term (current) use of aspirin: Secondary | ICD-10-CM | POA: Diagnosis not present

## 2023-10-04 DIAGNOSIS — W01190A Fall on same level from slipping, tripping and stumbling with subsequent striking against furniture, initial encounter: Secondary | ICD-10-CM | POA: Diagnosis not present

## 2023-10-04 DIAGNOSIS — S01312A Laceration without foreign body of left ear, initial encounter: Secondary | ICD-10-CM | POA: Diagnosis not present

## 2023-10-04 DIAGNOSIS — S93501A Unspecified sprain of right great toe, initial encounter: Secondary | ICD-10-CM | POA: Insufficient documentation

## 2023-10-04 DIAGNOSIS — S00402A Unspecified superficial injury of left ear, initial encounter: Secondary | ICD-10-CM | POA: Diagnosis present

## 2023-10-04 DIAGNOSIS — S93509A Unspecified sprain of unspecified toe(s), initial encounter: Secondary | ICD-10-CM

## 2023-10-04 MED ORDER — LIDOCAINE HCL (PF) 1 % IJ SOLN
10.0000 mL | Freq: Once | INTRAMUSCULAR | Status: DC
  Filled 2023-10-04: qty 30

## 2023-10-04 NOTE — ED Provider Notes (Signed)
 [facility] Provider Note   CSN: [id number] Arrival date & time: 10/04/23  9176     Patient presents with: Ear Injury   Vanessa Morton is a 38 y.o. female.   Patient presents to the emergency department for evaluation of left ear laceration and right fourth toe injury.  Patient had a mechanical fall after tripping just prior to arrival.  No loss of consciousness.  No subsequent headache or confusion.       Prior to Admission medications   Medication Sig Start Date End Date Taking? Authorizing Provider  Aspirin-Acetaminophen -Caffeine (GOODYS EXTRA STRENGTH) 500-325-65 MG PACK Take 1 packet by mouth 3 (three) times daily as needed (pain).    [provider]  clindamycin (CLEOCIN T) 1 % external solution Apply 1 application topically daily as needed (irritation). 02/22/21   [provider]  dicyclomine  (BENTYL ) 20 MG tablet Take 1 tablet (20 mg total) by mouth 2 (two) times daily. 03/12/23   Mannie Pac T, DO  EPINEPHrine  0.3 mg/0.3 mL IJ SOAJ injection Inject 0.3 mg into the muscle as needed for anaphylaxis. 10/27/20   [provider]  FEROSUL 325 (65 Fe) MG tablet Take 325 mg by mouth daily. 09/07/20   [provider]  gabapentin  (NEURONTIN ) 400 MG capsule Take 400 mg by mouth 3 (three) times daily. 09/29/20   [provider]  hydrochlorothiazide  (HYDRODIURIL ) 25 MG tablet Take 25 mg by mouth every morning. 11/25/19   [provider]  ibuprofen  (ADVIL ) 200 MG tablet Take 800 mg by mouth every 8 (eight) hours as needed for moderate pain. 10/10/20   [provider]  ibuprofen  (ADVIL ) 600 MG tablet Take 1 tablet (600 mg total) by mouth every 8 (eight) hours as needed for mild pain. 08/18/21   Laurice Maude BROCKS, MD  ibuprofen  (ADVIL ) 600 MG tablet Take 1 tablet (600 mg total) by mouth every 6 (six) hours as needed. 10/03/23   Horton, Charmaine FALCON, MD  losartan (COZAAR) 50 MG tablet Take 50  mg by mouth daily. 10/29/19   [provider]  metFORMIN (GLUCOPHAGE) 500 MG tablet Take 500 mg by mouth daily with breakfast.    [provider]  mupirocin ointment (BACTROBAN) 2 % Apply 1 application topically daily. 03/27/21   [provider]  naproxen  (NAPROSYN ) 500 MG tablet Take 1 tablet (500 mg total) by mouth 2 (two) times daily. 05/03/21   Raspet, Erin K, PA-C  ondansetron  (ZOFRAN ) 4 MG tablet Take 1 tablet (4 mg total) by mouth every 6 (six) hours. 03/12/23   Mannie Pac T, DO  ondansetron  (ZOFRAN -ODT) 4 MG disintegrating tablet Take 1 tablet (4 mg total) by mouth every 8 (eight) hours as needed for nausea or vomiting. 10/19/22   Tegeler, Lonni PARAS, MD  oxybutynin (DITROPAN-XL) 5 MG 24 hr tablet Take 5 mg by mouth at bedtime.    [provider]  pantoprazole  (PROTONIX ) 20 MG tablet Take 1 tablet (20 mg total) by mouth daily. Patient not taking: Reported on 03/29/2021 11/30/20   Lenor Hollering, MD  scopolamine  (TRANSDERM-SCOP) 1 MG/3DAYS Place 1 patch (1.5 mg total) onto the skin every 3 (three) days. 05/21/22   Haviland, Julie, MD  Semaglutide, 1 MG/DOSE, (OZEMPIC, 1 MG/DOSE,) 4 MG/3ML SOPN Inject 1 mg into the skin every Wednesday.    [provider]  sertraline (ZOLOFT) 50 MG tablet Take 50 mg by mouth daily.    [provider]  sucralfate  (CARAFATE ) 1 g tablet  Take 1 tablet (1 g total) by mouth 4 (four) times daily -  with meals and at bedtime. Patient not taking: Reported on 03/29/2021 11/30/20   Lenor Hollering, MD  zolpidem (AMBIEN) 10 MG tablet Take 10 mg by mouth at bedtime.    [provider]    Allergies: Amoxicillin  and Clindamycin    Review of Systems  Updated Vital Signs BP (!) 152/99 (BP Location: Right Arm)   Pulse 65   Temp 98.2 F (36.8 C) (Oral)   Resp 18   Ht 5' 1 (1.549 m)   Wt 102.1 kg   LMP 09/29/2023 (Approximate)   SpO2 100%   BMI 42.51 kg/m   Physical Exam Vitals and nursing note  reviewed.  Constitutional:      Appearance: She is well-developed.  HENT:     Head: Normocephalic. No raccoon eyes or Battle's sign.     Comments: Left ear laceration across the anterior earlobe, approximately 1.5 cm, superficial but wound edges are gaping approximately 2 or 3 mm, wound appears clean.  No active bleeding.    Right Ear: Tympanic membrane, ear canal and external ear normal. No hemotympanum.     Left Ear: Tympanic membrane, ear canal and external ear normal. No hemotympanum.     Nose: Nose normal.     Mouth/Throat:     Pharynx: Uvula midline.  Eyes:     General: Lids are normal.     Extraocular Movements:     Right eye: No nystagmus.     Left eye: No nystagmus.     Conjunctiva/sclera: Conjunctivae normal.     Pupils: Pupils are equal, round, and reactive to light.     Comments: No visible hyphema noted  Cardiovascular:     Rate and Rhythm: Normal rate and regular rhythm.  Pulmonary:     Effort: Pulmonary effort is normal.     Breath sounds: Normal breath sounds.  Abdominal:     Palpations: Abdomen is soft.     Tenderness: There is no abdominal tenderness.  Musculoskeletal:     Cervical back: Normal range of motion and neck supple. No tenderness or bony tenderness.     Thoracic back: No tenderness or bony tenderness.     Lumbar back: No tenderness or bony tenderness.     Comments: Right foot: Fourth toe tenderness, mild swelling, no deformity  Skin:    General: Skin is warm and dry.  Neurological:     Mental Status: She is alert and oriented to person, place, and time.     GCS: GCS eye subscore is 4. GCS verbal subscore is 5. GCS motor subscore is 6.     Cranial Nerves: No cranial nerve deficit.     Sensory: No sensory deficit.     Coordination: Coordination normal.     (all labs ordered are listed, but only abnormal results are displayed) Labs Reviewed - No data to display  EKG: None  Radiology: No results found.   .Laceration Repair  Date/Time:  10/04/2023 10:28 AM  Performed by: Desiderio Chew, PA-C Authorized by: Desiderio Chew, PA-C   Consent:    Consent obtained:  Verbal   Consent given by:  Patient   Risks discussed:  Infection, pain, poor cosmetic result and need for additional repair   Alternatives discussed: Suturing. Universal protocol:    Patient identity confirmed:  Verbally with patient and provided demographic data Anesthesia:    Anesthesia method:  None Laceration details:    Location:  Ear  Ear location:  L ear   Length (cm):  2 Treatment:    Area cleansed with:  Saline   Amount of cleaning:  Standard Skin repair:    Repair method:  Tissue adhesive Approximation:    Approximation:  Close Repair type:    Repair type:  Simple Post-procedure details:    Dressing:  Open (no dressing)   Procedure completion:  Tolerated well, no immediate complications Comments:     Dermabond used to better approximate left earlobe laceration.  She tolerated well.  There was definite improvement in approximation after procedure.  Patient counseled on wound care instructions.    Medications Ordered in the ED  lidocaine  (PF) (XYLOCAINE ) 1 % injection 10 mL (has no administration in time range)   ED Course  Patient seen and examined. History obtained directly from patient.   Labs/EKG: None ordered  Imaging: Discussed x-ray of the right foot with patient, discussed that unlikely to change management, she would prefer to have imaging and this was ordered.  Medications/Fluids: Ordered: Lidocaine  2%  Most recent vital signs reviewed and are as follows: BP (!) 152/99 (BP Location: Right Arm)   Pulse 65   Temp 98.2 F (36.8 C) (Oral)   Resp 18   Ht 5' 1 (1.549 m)   Wt 102.1 kg   LMP 09/29/2023 (Approximate)   SpO2 100%   BMI 42.51 kg/m   Initial impression: Ear laceration that will require repair, patient is very apprehensive about getting stitches.  She states that she has had significant pain with previous I&D.   Discussed that without closure, she will have a larger scar.  It will be more noticeable without closure possibly have a higher need for additional procedures in the future which may be technically challenging.  Patient would like to call a family member prior to committing to having wound closure performed.  10:08 AM Reassessment performed. Patient appears stable.  Updated on x-ray result.  Again asked the patient if she would like me to repair left ear laceration.  She declines.  Discussed that it is suboptimal, but will attempt to use Dermabond to better reapproximate the wound.  Patient agrees to proceed.  She verbalized that she is aware that dermabond less likely to provide optimal results.   Most current vital signs reviewed and are as follows: BP (!) 152/99 (BP Location: Right Arm)   Pulse 65   Temp 98.2 F (36.8 C) (Oral)   Resp 18   Ht 5' 1 (1.549 m)   Wt 102.1 kg   LMP 09/29/2023 (Approximate)   SpO2 100%   BMI 42.51 kg/m   Plan: Dermabond, buddy tape toe.  10:30 AM Reassessment performed. Patient appears comfortable. Exam unchanged.   Imaging personally visualized and interpreted including: X-ray of the foot, gree no fracture  Reviewed pertinent lab work and imaging with patient at bedside. Questions answered.   Most current vital signs reviewed and are as follows: BP (!) 152/99 (BP Location: Right Arm)   Pulse 65   Temp 98.2 F (36.8 C) (Oral)   Resp 18   Ht 5' 1 (1.549 m)   Wt 102.1 kg   LMP 09/29/2023 (Approximate)   SpO2 100%   BMI 42.51 kg/m   Plan: Discharge to home.   Prescriptions written for: None  Other home care instructions discussed: Patient counseled on wound care.    ED return instructions discussed: Patient was urged to return to the Emergency Department urgently with worsening pain, swelling, expanding erythema especially  if it streaks away from the affected area, fever, or if they have any other concerns.   Follow-up instructions  discussed: Patient encouraged to follow-up with PCP as needed.                                     Medical Decision Making Amount and/or Complexity of Data Reviewed Radiology: ordered.  Risk Prescription drug management.   Patient with fall, minor head injury.  Patient did sprain her toe.  No fracture on x-ray.  Will be treated with buddy taping, RICE protocol.  Patient also with left earlobe laceration.  Advised sutures would be best closure to minimize scarring.  Patient is averse to pain related to procedure given previous experiences with incision and drainage.  Wound was better approximated with Dermabond.       Final diagnoses:  Laceration of left earlobe, initial encounter  Sprain of toe, initial encounter  Fall, initial encounter    ED Discharge Orders     None          Desiderio Chew, DEVONNA 10/04/23 1031    Dasie Faden, MD 10/05/23 1423

## 2023-10-04 NOTE — ED Triage Notes (Addendum)
 Pt arrived from home after a fall on a coffee table. Pt has R ear cut post fall. Pt reported taking goody powder and tylenol  before coming

## 2023-10-04 NOTE — Discharge Instructions (Signed)
 Please read and follow all provided instructions.  Your diagnoses today include:  1. Laceration of left earlobe, initial encounter   2. Sprain of toe, initial encounter   3. Fall, initial encounter     Tests performed today include: X-ray of the affected area that did not show any foreign bodies or broken bones Vital signs. See below for your results today.   Medications prescribed:  None  Take any prescribed medications only as directed.   Home care instructions:  Follow any educational materials and wound care instructions contained in this packet.   Do not put antibiotic ointment on the earlobe as this will breakdown the glue faster.  Keep the glue dry today and you may get it wet tomorrow.  Keep affected area above the level of your heart when possible to minimize swelling. Wash area gently twice a day with warm soapy water. Do not apply alcohol or hydrogen peroxide. Cover the area if it draining or weeping.   Follow-up instructions: Follow-up with your doctor as needed  Return instructions:  Return to the Emergency Department if you have: Fever Worsening pain Worsening swelling of the wound Pus draining from the wound Redness of the skin that moves away from the wound, especially if it streaks away from the affected area  Any other emergent concerns  Your vital signs today were: BP (!) 152/99 (BP Location: Right Arm)   Pulse 65   Temp 98.2 F (36.8 C) (Oral)   Resp 18   Ht 5' 1 (1.549 m)   Wt 102.1 kg   LMP 09/29/2023 (Approximate)   SpO2 100%   BMI 42.51 kg/m  If your blood pressure (BP) was elevated above 135/85 this visit, please have this repeated by your doctor within one month. --------------

## 2023-10-07 ENCOUNTER — Ambulatory Visit: Payer: MEDICAID | Attending: Nurse Practitioner

## 2023-10-16 ENCOUNTER — Ambulatory Visit: Payer: MEDICAID

## 2023-10-16 DIAGNOSIS — R293 Abnormal posture: Secondary | ICD-10-CM

## 2023-10-16 DIAGNOSIS — M6281 Muscle weakness (generalized): Secondary | ICD-10-CM

## 2023-10-16 DIAGNOSIS — M62838 Other muscle spasm: Secondary | ICD-10-CM

## 2023-10-16 DIAGNOSIS — R279 Unspecified lack of coordination: Secondary | ICD-10-CM
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
## 2023-10-20 ENCOUNTER — Ambulatory Visit: Payer: MEDICAID | Attending: Nurse Practitioner

## 2023-10-20 DIAGNOSIS — M6281 Muscle weakness (generalized): Secondary | ICD-10-CM | POA: Diagnosis present

## 2023-10-20 DIAGNOSIS — R293 Abnormal posture: Secondary | ICD-10-CM | POA: Insufficient documentation

## 2023-10-20 DIAGNOSIS — M62838 Other muscle spasm: Secondary | ICD-10-CM | POA: Insufficient documentation

## 2023-10-20 DIAGNOSIS — R279 Unspecified lack of coordination: Secondary | ICD-10-CM | POA: Diagnosis present
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
## 2023-10-23 ENCOUNTER — Emergency Department (HOSPITAL_BASED_OUTPATIENT_CLINIC_OR_DEPARTMENT_OTHER)
Admission: EM | Admit: 2023-10-23 | Discharge: 2023-10-23 | Disposition: A | Discharge status: Home / Self Care | Payer: MEDICAID | Attending: Emergency Medicine | Admitting: Emergency Medicine

## 2023-10-23 ENCOUNTER — Emergency Department (HOSPITAL_BASED_OUTPATIENT_CLINIC_OR_DEPARTMENT_OTHER): Payer: MEDICAID

## 2023-10-23 ENCOUNTER — Other Ambulatory Visit: Payer: Self-pay

## 2023-10-23 ENCOUNTER — Encounter (HOSPITAL_BASED_OUTPATIENT_CLINIC_OR_DEPARTMENT_OTHER): Payer: Self-pay | Admitting: Emergency Medicine

## 2023-10-23 DIAGNOSIS — Z7982 Long term (current) use of aspirin: Secondary | ICD-10-CM | POA: Insufficient documentation

## 2023-10-23 DIAGNOSIS — Y92410 Unspecified street and highway as the place of occurrence of the external cause: Secondary | ICD-10-CM | POA: Diagnosis not present

## 2023-10-23 DIAGNOSIS — S39012A Strain of muscle, fascia and tendon of lower back, initial encounter: Secondary | ICD-10-CM | POA: Diagnosis not present

## 2023-10-23 DIAGNOSIS — Z79899 Other long term (current) drug therapy: Secondary | ICD-10-CM | POA: Diagnosis not present

## 2023-10-23 DIAGNOSIS — S0990XA Unspecified injury of head, initial encounter: Secondary | ICD-10-CM | POA: Diagnosis present

## 2023-10-23 DIAGNOSIS — I10 Essential (primary) hypertension: Secondary | ICD-10-CM | POA: Diagnosis not present

## 2023-10-23 LAB — PREGNANCY, URINE: Preg Test, Ur: NEGATIVE
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
## 2023-10-23 NOTE — Discharge Instructions (Signed)
 Would recommend Motrin  800 mg every 8 hours for any discomfort.  Expect to be sore and stiff for the next couple days.  Work note provided.  CT head neck and lumbar back without any acute injuries.  Which is very good.
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
## 2023-10-23 NOTE — ED Notes (Signed)
 Called CT to notify of negative urine preg result

## 2023-10-27 ENCOUNTER — Ambulatory Visit: Payer: MEDICAID

## 2023-10-27 ENCOUNTER — Ambulatory Visit: Payer: MEDICAID | Admitting: Podiatry

## 2023-10-28 ENCOUNTER — Ambulatory Visit: Payer: MEDICAID | Admitting: Physical Therapy

## 2023-10-31 ENCOUNTER — Ambulatory Visit: Payer: MEDICAID | Admitting: Podiatry

## 2023-11-10 ENCOUNTER — Ambulatory Visit: Payer: MEDICAID | Admitting: Podiatry

## 2023-12-04 ENCOUNTER — Ambulatory Visit: Payer: MEDICAID

## 2023-12-16 ENCOUNTER — Ambulatory Visit
Admission: RE | Admit: 2023-12-16 | Discharge: 2023-12-16 | Disposition: A | Discharge status: Home / Self Care | Payer: MEDICAID | Source: Ambulatory Visit | Attending: Nurse Practitioner | Admitting: Nurse Practitioner

## 2023-12-16 ENCOUNTER — Other Ambulatory Visit: Payer: Self-pay | Admitting: Nurse Practitioner

## 2023-12-16 DIAGNOSIS — M79645 Pain in left finger(s): Secondary | ICD-10-CM

## 2023-12-23 ENCOUNTER — Encounter (HOSPITAL_COMMUNITY): Payer: Self-pay | Admitting: Oral Surgery

## 2023-12-23 NOTE — H&P (Signed)
  Patient: Vanessa Morton  PID: [id number]  DOB: [birth year]  SEX: Female   Patient referred by DDS for extraction teeth # 3, 29.   CC: Pain upper right, lower right.  Past Medical History:  Borderline Diabetes, Mental Health problems, Morbid Obesity, High Blood Pressure, Swollen ankles, Bruise Easily    Medications: Iron Pill, Ambien, Bupropion, Suboxone     Allergies:     Penicillin , Clindamycin    Surgeries:   C Section, Oral Surgery, cyst removal, Tonsillectomy     Social History       Smoking:            Alcohol: Drug use:                             Exam: BMI 44. Decay # 3, 29.   No purulence, edema, fluctuance, trismus. Oral cancer screening negative. Pharynx clear. No lymphadenopathy.  Panorex:Decay # 3, 29.   Assessment: ASA 3. Non-restorable  teeth # 3, 29.              Plan: Extraction Teeth # 3, 29.   Hospital Day surgery.                 Rx: n               Risks and complications explained. Questions answered.   Glendia EMERSON Primrose, DMD

## 2023-12-24 ENCOUNTER — Encounter (HOSPITAL_COMMUNITY): Payer: Self-pay | Admitting: Oral Surgery

## 2023-12-24 ENCOUNTER — Other Ambulatory Visit: Payer: Self-pay

## 2023-12-24 NOTE — Progress Notes (Signed)
 PCP - Nell Piety, FNP Cardiologist - none  Chest x-ray - 10/09/23 [facility] EKG - 03/17/23 most recent -Lindsi Requested Tracing EKG - 03/12/23 copy on chart Stress Test - 07/10/21 ECHO - 08/17/21 [facility] Cardiac Cath - n/a  ICD Pacemaker/Loop - n/a  Sleep Study -  n/a  Diabetes Type 2, no meds, does not check blood sugar  Aspirin & Blood Thinner Instructions:  n/a  NPO  Anesthesia review: Yes  STOP now taking any Aspirin (unless otherwise instructed by your surgeon), Aleve , Naproxen , Ibuprofen , Motrin , Advil , Goody's, BC's, all herbal medications, fish oil, and all vitamins.   Coronavirus Screening Do you have any of the following symptoms:  Cough yes/no: No Fever (>100.46F)  yes/no: No Runny nose yes/no: No Sore throat yes/no: No Difficulty breathing/shortness of breath  yes/no: No  Have you traveled in the last 14 days and where? yes/no: No  Patient verbalized understanding of instructions that were given via phone.

## 2023-12-24 NOTE — Anesthesia Preprocedure Evaluation (Signed)
 Anesthesia Evaluation  Patient identified by MRN, date of birth, ID bandGeneral Assessment Comment: Patient extremely drowsy, states she got little sleep last night. Frequently needs to be tapped on the leg to wake for answering questions during H&P. Adamantly denies and drug use or medication taken today, aside from tylenol  provided in preop. Was hypoglycemic on arrival with BS 64, but most recent BS 122.   Reviewed: Allergy & Precautions, NPO status , Patient's Chart, lab work & pertinent test results  History of Anesthesia Complications Negative for: history of anesthetic complications  Airway Mallampati: II  TM Distance: >3 FB Neck ROM: Full    Dental  (+) Dental Advisory Given   Pulmonary neg pulmonary ROS   Pulmonary exam normal        Cardiovascular hypertension, Pt. on medications Normal cardiovascular exam     Neuro/Psych  PSYCHIATRIC DISORDERS Anxiety     negative neurological ROS     GI/Hepatic Neg liver ROS,GERD  Medicated and Controlled,, S/p laparoscopic sleeve gastrectomy    Endo/Other  diabetes, Type 2, Oral Hypoglycemic Agents  Class 3 obesity  Renal/GU negative Renal ROS     Musculoskeletal negative musculoskeletal ROS (+)    Abdominal  (+) + obese  Peds  Hematology negative hematology ROS (+)   Anesthesia Other Findings On suboxone  d/t chronic pain  Reproductive/Obstetrics                              Anesthesia Physical Anesthesia Plan  ASA: 3  Anesthesia Plan: General   Post-op Pain Management: Tylenol  PO (pre-op)*   Induction: Intravenous  PONV Risk Score and Plan: 3 and Treatment may vary due to age or medical condition, Ondansetron , Dexamethasone  and Midazolam   Airway Management Planned: Nasal ETT  Additional Equipment: None  Intra-op Plan:   Post-operative Plan: Extubation in OR  Informed Consent: I have reviewed the patients History and Physical,  chart, labs and discussed the procedure including the risks, benefits and alternatives for the proposed anesthesia with the patient or authorized representative who has indicated his/her understanding and acceptance.     Dental advisory given  Plan Discussed with: CRNA and Anesthesiologist  Anesthesia Plan Comments: (PAT note written 12/24/2023 by Adileny Delon, PA-C.  )         Anesthesia Quick Evaluation

## 2023-12-24 NOTE — Progress Notes (Signed)
 Anesthesia Chart Review: Vanessa Morton  Case: [id number] Date/Time: 12/25/23 1342   Procedure: DENTAL RESTORATION/EXTRACTIONS   Anesthesia type: General   Diagnosis: Non-restorable tooth [K08.89]   Pre-op diagnosis: non restorable   [facility] 08 [facility]   Surgeons: Sheryle Hamilton, DMD       DISCUSSION: Patient is a 38 year old female scheduled for the above procedure.  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   She had reassuring cardiac work-up prior to bariatric surgery in 2023.   He is a same-day workup, so anesthesia team to evaluate on the day of surgery.  She was given surgical clearance by her PCP Macarthur Piety, NP (Scanned under Media tab).   VS: Ht 5' 1 (1.549 m)   Wt 105.2 kg   LMP  (LMP Unknown)   BMI 43.84 kg/m  Wt Readings from Last 3 Encounters:  12/24/23 105.2 kg  10/23/23 108 kg  10/04/23 102.1 kg   BP Readings from Last 3 Encounters:  10/23/23 138/88  10/04/23 133/85  10/03/23 116/71   Pulse Readings from Last 3 Encounters:  10/23/23 80  10/04/23 65  10/03/23 61     PROVIDERS: Piety Nell SAILOR, FNP is PCP ([facility]) Curcio,Kristin, NP/Chen, Johnie, MD is HEM  LABS: For day of surgery as indicated.  Last results noted are from 10/09/2023 through [facility].  Results include sodium 142, potassium 3.9, BUN 10, creatinine 0.56, eGFR greater than 90, AST 18, ALT 15, alkaline phosphatase 67, total bilirubin 0.3, calcium 9.0, glucose 88, magnesium  2.0, proBNP 99, WBC 5.3, hemoglobin 10.6, hematocrit 34.5, platelet count 232. (Labs done during ED visit for ankle swelling.  BNP normal and lower extremity ultrasound was negative for DVT.)   IMAGES: CXR 10/09/2023 [facility]): Impression: Frontal and lateral radiographs of the chest  demonstrate the cardiomediastinal silhouette to be mildly  enlarged. The lungs are clear. There is no pleural effusion or pneumothorax.    EKG: 10/09/2023 [facility]): Tracing scanned under Media tab. Per narrative in [facility]: Normal sinus rhythm with sinus arrhythmia  Cannot rule out Anterior infarct , age undetermined  Abnormal ECG  No previous ECGs available  Confirmed by Missie Battiest [phone]) on 10/11/2023 11:22:41 AM    CV: BLE Venous US  10/09/2023 [facility]): Impression: No evidence of deep vein thrombosis bilateral lower extremities.   Echo 08/17/2021 ([facility]): Left Ventricle: Left ventricle size is normal.    Left Ventricle: Systolic function is normal. EF: 55-60%.    Right Ventricle: Right ventricle size is normal.    Right Ventricle: Systolic function is normal.    No major valvular abnormalities.   Nuclear stress test 4/25/203 ([facility]): Impression:   1. No chest pain with stress.  2. No ST changes to suggest ischemia.  3. Normal LV systolic function and wall motion.  4. No ischemia by perfusion imaging.  5. Low risk study.    Past Medical History:  Diagnosis Date   Abscess    Anxiety    Chest pain    Diabetes mellitus without complication (HCC)    no meds, does not check blood sugar   Gonorrhea    Hypertension    pt denies this dx - on HCTZ   IDA (iron deficiency anemia) 01/20/2023   hx iron infusions   Morbid obesity (HCC)    Obesity    Urinary tract  infection     Past Surgical History:  Procedure Laterality Date   CESAREAN SECTION     x 1   DIAGNOSTIC LAPAROSCOPY     hx laparoscopic sleeve gastrectomy   GASTRIC BYPASS  02/2022   [facility]   HYDRADENITIS EXCISION N/A 03/04/2019   Procedure: EXCISION HIDRADENITIS RIGHT AXILLA X2 AND BACK X2;  Surgeon: Curvin Deward MOULD, MD;  Location: [facility];  Service: General;  Laterality: N/A;   TONSILLECTOMY     TOOTH EXTRACTION N/A 04/03/2021   Procedure: DENTAL RESTORATION/EXTRACTIONS;  Surgeon: Sheryle Hamilton, DMD;   Location: [facility];  Service: Oral Surgery;  Laterality: N/A;    MEDICATIONS: No current facility-administered medications for this encounter.    Aspirin-Acetaminophen -Caffeine (GOODYS EXTRA STRENGTH) 500-325-65 MG PACK   clindamycin (CLEOCIN T) 1 % external solution   dicyclomine  (BENTYL ) 20 MG tablet   EPINEPHrine  0.3 mg/0.3 mL IJ SOAJ injection   FEROSUL 325 (65 Fe) MG tablet   gabapentin  (NEURONTIN ) 400 MG capsule   hydrochlorothiazide  (HYDRODIURIL ) 25 MG tablet   ibuprofen  (ADVIL ) 200 MG tablet   ibuprofen  (ADVIL ) 600 MG tablet   ibuprofen  (ADVIL ) 600 MG tablet   losartan (COZAAR) 50 MG tablet   metFORMIN (GLUCOPHAGE) 500 MG tablet   mupirocin ointment (BACTROBAN) 2 %   naproxen  (NAPROSYN ) 500 MG tablet   ondansetron  (ZOFRAN ) 4 MG tablet   ondansetron  (ZOFRAN -ODT) 4 MG disintegrating tablet   oxybutynin (DITROPAN-XL) 5 MG 24 hr tablet   pantoprazole  (PROTONIX ) 20 MG tablet   scopolamine  (TRANSDERM-SCOP) 1 MG/3DAYS   Semaglutide, 1 MG/DOSE, (OZEMPIC, 1 MG/DOSE,) 4 MG/3ML SOPN   sertraline (ZOLOFT) 50 MG tablet   sucralfate  (CARAFATE ) 1 g tablet   zolpidem (AMBIEN) 10 MG tablet    Per PAT RN phone interview, she is no longer taking medication for DM (ie, had previously been on metformin and Ozempic.)   Vanessa Lockamy, PA-C Surgical Short Stay/Anesthesiology [facility] Phone [phone] [facility] Phone [phone] 12/24/2023 1:47 PM

## 2023-12-25 ENCOUNTER — Ambulatory Visit (HOSPITAL_COMMUNITY): Payer: MEDICAID | Admitting: Vascular Surgery

## 2023-12-25 ENCOUNTER — Encounter (HOSPITAL_COMMUNITY): Payer: Self-pay | Admitting: Oral Surgery

## 2023-12-25 ENCOUNTER — Encounter (HOSPITAL_COMMUNITY)
Admission: RE | Disposition: A | Discharge status: Home / Self Care | Payer: Self-pay | Source: Home / Self Care | Attending: Oral Surgery

## 2023-12-25 ENCOUNTER — Ambulatory Visit (HOSPITAL_BASED_OUTPATIENT_CLINIC_OR_DEPARTMENT_OTHER): Payer: MEDICAID | Admitting: Vascular Surgery

## 2023-12-25 ENCOUNTER — Other Ambulatory Visit: Payer: Self-pay

## 2023-12-25 ENCOUNTER — Ambulatory Visit (HOSPITAL_COMMUNITY)
Admission: RE | Admit: 2023-12-25 | Discharge: 2023-12-25 | Disposition: A | Discharge status: Home / Self Care | Payer: MEDICAID | Attending: Oral Surgery | Admitting: Oral Surgery

## 2023-12-25 DIAGNOSIS — I10 Essential (primary) hypertension: Secondary | ICD-10-CM | POA: Insufficient documentation

## 2023-12-25 DIAGNOSIS — Z6841 Body Mass Index (BMI) 40.0 and over, adult: Secondary | ICD-10-CM

## 2023-12-25 DIAGNOSIS — G8929 Other chronic pain: Secondary | ICD-10-CM | POA: Insufficient documentation

## 2023-12-25 DIAGNOSIS — E119 Type 2 diabetes mellitus without complications: Secondary | ICD-10-CM | POA: Diagnosis not present

## 2023-12-25 DIAGNOSIS — Z7985 Long-term (current) use of injectable non-insulin antidiabetic drugs: Secondary | ICD-10-CM | POA: Diagnosis not present

## 2023-12-25 DIAGNOSIS — E66813 Obesity, class 3: Secondary | ICD-10-CM | POA: Diagnosis not present

## 2023-12-25 DIAGNOSIS — K0889 Other specified disorders of teeth and supporting structures: Secondary | ICD-10-CM

## 2023-12-25 DIAGNOSIS — F419 Anxiety disorder, unspecified: Secondary | ICD-10-CM | POA: Diagnosis not present

## 2023-12-25 DIAGNOSIS — Z79891 Long term (current) use of opiate analgesic: Secondary | ICD-10-CM | POA: Diagnosis not present

## 2023-12-25 DIAGNOSIS — Z9884 Bariatric surgery status: Secondary | ICD-10-CM | POA: Insufficient documentation

## 2023-12-25 DIAGNOSIS — K029 Dental caries, unspecified: Secondary | ICD-10-CM | POA: Insufficient documentation

## 2023-12-25 DIAGNOSIS — Z7984 Long term (current) use of oral hypoglycemic drugs: Secondary | ICD-10-CM | POA: Insufficient documentation

## 2023-12-25 HISTORY — PX: TOOTH EXTRACTION: SHX859

## 2023-12-25 HISTORY — DX: Gastro-esophageal reflux disease without esophagitis: K21.9

## 2023-12-25 LAB — BASIC METABOLIC PANEL WITH GFR
Anion gap: 8 (ref 5–15)
BUN: 11 mg/dL (ref 6–20)
CO2: 22 mmol/L (ref 22–32)
Calcium: 8.7 mg/dL — ABNORMAL LOW (ref 8.9–10.3)
Chloride: 108 mmol/L (ref 98–111)
Creatinine, Ser: 0.65 mg/dL (ref 0.44–1.00)
GFR, Estimated: >60 mL/min (ref >60)
Glucose, Bld: 79 mg/dL (ref 70–99)
Potassium: 4 mmol/L (ref 3.5–5.1)
Sodium: 138 mmol/L (ref 135–145)

## 2023-12-25 LAB — CBC
HCT: 38.8 % (ref 36.0–46.0)
Hemoglobin: 12.1 g/dL (ref 12.0–15.0)
MCH: 31.4 pg (ref 26.0–34.0)
MCHC: 31.2 g/dL (ref 30.0–36.0)
MCV: 100.8 fL — ABNORMAL HIGH (ref 80.0–100.0)
Platelets: 220 K/uL (ref 150–400)
RBC: 3.85 MIL/uL — ABNORMAL LOW (ref 3.87–5.11)
RDW: 12.3 % (ref 11.5–15.5)
WBC: 7.1 K/uL (ref 4.0–10.5)
nRBC: 0 % (ref 0.0–0.2)

## 2023-12-25 LAB — GLUCOSE, CAPILLARY
Glucose-Capillary: 64 mg/dL — ABNORMAL LOW (ref 70–99)
Glucose-Capillary: 122 mg/dL — ABNORMAL HIGH (ref 70–99)
Glucose-Capillary: 70 mg/dL (ref 70–99)
Glucose-Capillary: 94 mg/dL (ref 70–99)

## 2023-12-25 LAB — POCT PREGNANCY, URINE: Preg Test, Ur: NEGATIVE

## 2023-12-25 SURGERY — DENTAL RESTORATION/EXTRACTIONS
Anesthesia: General | Site: Mouth

## 2023-12-25 MED ORDER — DEXAMETHASONE SOD PHOSPHATE PF 10 MG/ML IJ SOLN
INTRAMUSCULAR | Status: DC | PRN
  Administered 2023-12-25: 10 mg via INTRAVENOUS

## 2023-12-25 MED ORDER — ONDANSETRON HCL 4 MG/2ML IJ SOLN
INTRAMUSCULAR | Status: DC | PRN
  Administered 2023-12-25: 4 mg via INTRAVENOUS

## 2023-12-25 MED ORDER — FENTANYL CITRATE (PF) 250 MCG/5ML IJ SOLN
INTRAMUSCULAR | Status: AC
  Filled 2023-12-25: qty 5

## 2023-12-25 MED ORDER — FENTANYL CITRATE (PF) 250 MCG/5ML IJ SOLN
INTRAMUSCULAR | Status: DC | PRN
  Administered 2023-12-25: 50 ug via INTRAVENOUS

## 2023-12-25 MED ORDER — LIDOCAINE-EPINEPHRINE 2 %-1:100000 IJ SOLN
INTRAMUSCULAR | Status: AC
  Filled 2023-12-25: qty 1

## 2023-12-25 MED ORDER — PROPOFOL 10 MG/ML IV BOLUS
INTRAVENOUS | Status: AC
Start: 2023-12-25 — End: 2023-12-25
  Filled 2023-12-25: qty 20

## 2023-12-25 MED ORDER — METRONIDAZOLE 375 MG PO CAPS: 375.0000 mg | ORAL_CAPSULE | Freq: Two times a day (BID) | ORAL | 0 refills | Status: AC

## 2023-12-25 MED ORDER — DEXTROSE 50 % IV SOLN
12.5000 g | INTRAVENOUS | Status: AC
  Filled 2023-12-25: qty 50

## 2023-12-25 MED ORDER — LACTATED RINGERS IV SOLN
INTRAVENOUS | Status: DC
Start: 2023-12-25 — End: 2023-12-25

## 2023-12-25 MED ORDER — DEXTROSE 50 % IV SOLN
INTRAVENOUS | Status: AC
  Filled 2023-12-25: qty 50

## 2023-12-25 MED ORDER — LIDOCAINE 2% (20 MG/ML) 5 ML SYRINGE
INTRAMUSCULAR | Status: DC | PRN
  Administered 2023-12-25: 60 mg via INTRAVENOUS

## 2023-12-25 MED ORDER — DEXTROSE 50 % IV SOLN
INTRAVENOUS | Status: AC
  Administered 2023-12-25: 12.5 g via INTRAVENOUS
  Filled 2023-12-25: qty 50

## 2023-12-25 MED ORDER — DEXTROSE 50 % IV SOLN
12.5000 g | Freq: Once | INTRAVENOUS | Status: AC
  Administered 2023-12-25: 12.5 g via INTRAVENOUS

## 2023-12-25 MED ORDER — MIDAZOLAM HCL 2 MG/2ML IJ SOLN
INTRAMUSCULAR | Status: AC
Start: 2023-12-25 — End: 2023-12-25
  Filled 2023-12-25: qty 2

## 2023-12-25 MED ORDER — SUCCINYLCHOLINE CHLORIDE 200 MG/10ML IV SOSY
PREFILLED_SYRINGE | INTRAVENOUS | Status: DC | PRN
  Administered 2023-12-25: 120 mg via INTRAVENOUS

## 2023-12-25 MED ORDER — ORAL CARE MOUTH RINSE: 15.0000 mL | Freq: Once | OROMUCOSAL | Status: AC

## 2023-12-25 MED ORDER — CHLORHEXIDINE GLUCONATE 0.12 % MT SOLN
15.0000 mL | Freq: Once | OROMUCOSAL | Status: AC
  Administered 2023-12-25: 15 mL via OROMUCOSAL
  Filled 2023-12-25: qty 15

## 2023-12-25 MED ORDER — LIDOCAINE-EPINEPHRINE 2 %-1:100000 IJ SOLN
INTRAMUSCULAR | Status: DC | PRN
  Administered 2023-12-25: 10 mL

## 2023-12-25 MED ORDER — 0.9 % SODIUM CHLORIDE (POUR BTL) OPTIME
TOPICAL | Status: DC | PRN
  Administered 2023-12-25: 100 mL

## 2023-12-25 MED ORDER — ACETAMINOPHEN 500 MG PO TABS
1000.0000 mg | ORAL_TABLET | Freq: Once | ORAL | Status: AC
  Administered 2023-12-25: 1000 mg via ORAL
  Filled 2023-12-25: qty 2

## 2023-12-25 MED ORDER — HYDROCODONE-ACETAMINOPHEN 5-325 MG PO TABS: 1.0000 | ORAL_TABLET | ORAL | 0 refills | Status: AC | PRN

## 2023-12-25 MED ORDER — PROPOFOL 10 MG/ML IV BOLUS
INTRAVENOUS | Status: DC | PRN
  Administered 2023-12-25: 200 mg via INTRAVENOUS
  Administered 2023-12-25: 60 mg via INTRAVENOUS

## 2023-12-25 MED ORDER — VANCOMYCIN HCL 1500 MG/300ML IV SOLN
1500.0000 mg | INTRAVENOUS | Status: AC
  Administered 2023-12-25: 1500 mg via INTRAVENOUS
  Filled 2023-12-25: qty 300

## 2023-12-25 SURGICAL SUPPLY — 27 items
BAG COUNTER SPONGE SURGICOUNT (BAG) IMPLANT
BLADE SURG 15 STRL LF DISP TIS (BLADE) ×1 IMPLANT
BUR CROSS CUT FISSURE 1.6 (BURR) ×1 IMPLANT
BUR EGG ELITE 4.0 (BURR) IMPLANT
CANISTER SUCTION 3000ML PPV (SUCTIONS) ×1 IMPLANT
COVER SURGICAL LIGHT HANDLE (MISCELLANEOUS) ×1 IMPLANT
GAUZE PACKING FOLDED 2 STR (GAUZE/BANDAGES/DRESSINGS) ×1 IMPLANT
GLOVE BIO SURGEON STRL SZ8 (GLOVE) ×1 IMPLANT
GOWN STRL REUS W/ TWL LRG LVL3 (GOWN DISPOSABLE) ×1 IMPLANT
GOWN STRL REUS W/ TWL XL LVL3 (GOWN DISPOSABLE) ×1 IMPLANT
IV 0.9% NACL 1000 ML (IV SOLUTION) ×1 IMPLANT
KIT BASIN OR (CUSTOM PROCEDURE TRAY) ×1 IMPLANT
KIT TURNOVER KIT B (KITS) ×1 IMPLANT
NDL HYPO 25GX1X1/2 BEV (NEEDLE) ×2 IMPLANT
NEEDLE HYPO 25GX1X1/2 BEV (NEEDLE) ×2 IMPLANT
PAD ARMBOARD POSITIONER FOAM (MISCELLANEOUS) ×1 IMPLANT
SLEEVE IRRIGATION ELITE 7 (MISCELLANEOUS) ×1 IMPLANT
SOLN 0.9% NACL 1000 ML (IV SOLUTION) ×1 IMPLANT
SOLN 0.9% NACL POUR BTL 1000ML (IV SOLUTION) ×1 IMPLANT
SPIKE FLUID TRANSFER (MISCELLANEOUS) IMPLANT
SUT CHROMIC 3 0 SH 27 (SUTURE) IMPLANT
SUT PLAIN 3 0 PS2 27 (SUTURE) IMPLANT
SYR BULB IRRIG 60ML STRL (SYRINGE) ×1 IMPLANT
SYR CONTROL 10ML LL (SYRINGE) ×1 IMPLANT
TRAY ENT MC OR (CUSTOM PROCEDURE TRAY) ×1 IMPLANT
TUBING IRRIGATION (MISCELLANEOUS) ×1 IMPLANT
YANKAUER SUCT BULB TIP NO VENT (SUCTIONS) ×1 IMPLANT

## 2023-12-25 NOTE — H&P (Signed)
 H&P documentation  -History and Physical Reviewed  -Patient has been re-examined  -No change in the plan of care  Vanessa Morton

## 2023-12-25 NOTE — OR Nursing (Signed)
 Throat pack in 1432, out 1440

## 2023-12-25 NOTE — Anesthesia Postprocedure Evaluation (Signed)
 Anesthesia Post Note  Patient: Vanessa Morton  Procedure(s) Performed: DENTAL RESTORATION/EXTRACTIONS (Mouth)     Patient location during evaluation: PACU Anesthesia Type: General Level of consciousness: awake and alert Pain management: pain level controlled Vital Signs Assessment: post-procedure vital signs reviewed and stable Respiratory status: spontaneous breathing, nonlabored ventilation and respiratory function stable Cardiovascular status: stable and blood pressure returned to baseline Anesthetic complications: no   No notable events documented.  Last Vitals:  Vitals:   12/25/23 1600 12/25/23 1608  BP: 126/86 (!) 139/95  Pulse:    Resp: 12 14  Temp:    SpO2:      Last Pain:  Vitals:   12/25/23 1530  TempSrc:   PainSc: Asleep                 Debby FORBES Like

## 2023-12-25 NOTE — Progress Notes (Signed)
 Patient's son is present to ride home with the patient via [place]. Patient son's name is Chameka Mcmullen [phone].

## 2023-12-25 NOTE — Transfer of Care (Signed)
 Immediate Anesthesia Transfer of Care Note  Patient: Vanessa Morton  Procedure(s) Performed: DENTAL RESTORATION/EXTRACTIONS (Mouth)  Patient Location: PACU  Anesthesia Type:General  Level of Consciousness: drowsy  Airway & Oxygen Therapy: Patient Spontanous Breathing and Patient connected to face mask oxygen  Post-op Assessment: Report given to RN and Post -op Vital signs reviewed and stable  Post vital signs: Reviewed and stable  Last Vitals:  Vitals Value Taken Time  BP 136/93 12/25/23 15:15  Temp    Pulse 73 12/25/23 15:22  Resp 13 12/25/23 15:25  SpO2 94 % 12/25/23 15:22  Vitals shown include unfiled device data.  Last Pain:  Vitals:   12/25/23 1500  TempSrc:   PainSc: Asleep      Patients Stated Pain Goal: 1 (12/25/23 1208)  Complications: No notable events documented.

## 2023-12-25 NOTE — Op Note (Signed)
 12/25/2023  2:43 PM  PATIENT:  Vanessa Morton  38 y.o. female  PRE-OPERATIVE DIAGNOSIS:  non restorable teeth # 3, 29  POST-OPERATIVE DIAGNOSIS:  SAME  PROCEDURE:  Procedure(s): EXTRACTION  teeth # 3, 29  SURGEON:  Surgeon(s): Sheryle Hamilton, DMD  ANESTHESIA:   local and general  EBL:  minimal  DRAINS: none   SPECIMEN:  No Specimen  COUNTS:  YES  PLAN OF CARE: Discharge to home after PACU  PATIENT DISPOSITION:  PACU - hemodynamically stable.   PROCEDURE DETAILS: Dictation # [id number]  Hamilton EMERSON Sheryle, DMD 12/25/2023 2:43 PM

## 2023-12-25 NOTE — Anesthesia Procedure Notes (Signed)
 Procedure Name: Intubation Date/Time: 12/25/2023 2:29 PM  Performed by: Julien Manus, CRNAPre-anesthesia Checklist: Patient identified, Emergency Drugs available, Suction available and Patient being monitored Patient Re-evaluated:Patient Re-evaluated prior to induction Oxygen Delivery Method: Circle System Utilized Preoxygenation: Pre-oxygenation with 100% oxygen Induction Type: IV induction Ventilation: Mask ventilation without difficulty Laryngoscope Size: Glidescope and 3 Grade View: Grade II Nasal Tubes: Nasal Rae, Nasal prep performed and Right Tube size: 7.0 mm Number of attempts: 1 Placement Confirmation: ETT inserted through vocal cords under direct vision, positive ETCO2 and breath sounds checked- equal and bilateral Secured at: 27 cm Tube secured with: Tape Dental Injury: Teeth and Oropharynx as per pre-operative assessment

## 2023-12-25 NOTE — Op Note (Unsigned)
 NAME: [name], Vanessa Morton MEDICAL RECORD NO: [id number] ACCOUNT NO: [id number] DATE OF BIRTH: 1985/10/02 FACILITY[facility] LOCATION: [facility] PHYSICIAN: Glendia EMERSON Primrose, DDS  Operative Report   DATE OF PROCEDURE: 12/25/2023  PREOPERATIVE DIAGNOSES:  Nonrestorable teeth numbers 3 and 29 secondary to dental caries.  POSTOPERATIVE DIAGNOSES:  Nonrestorable teeth numbers 3 and 29 secondary to dental caries.  PROCEDURE:  Extraction of teeth numbers 3 and 29.  SURGEON:  Glendia EMERSON Primrose, DDS.  ANESTHESIA:  General with nasal intubation.  Dr. Lucious, attending.  DESCRIPTION OF PROCEDURE:  The patient was taken to the operating room and placed on the table in supine position.  General anesthesia was administered.  A nasal endotracheal tube was placed and secured.  The eyes were protected.  The patient was draped  for surgery.  Timeout was performed.  The posterior pharynx was suctioned and a throat pack was placed.  2% lidocaine  1:100,000 epinephrine  was infiltrated in a right inferior alveolar block and then buccal and palatal infiltration in the right maxilla  around tooth #3.  The 15 blade was used to make an incision in the sulcus of tooth #29 and tooth #3.  The teeth were elevated with a periosteal elevator and dental 301 elevator and then removed from the mouth with the dental forceps.  The sockets were  curetted, irrigated, and closed with 3-0 chromic.  The oral cavity was irrigated and suctioned.  The throat pack was removed.  The patient was left in the care of anesthesia for extubation and transport to recovery with plans for discharge home through  day surgery.  ESTIMATED BLOOD LOSS:  Minimal.  COMPLICATIONS:  None.  SPECIMENS:  None.  COUNTS:  Counts were correct.   PUS D: 12/25/2023 2:46:08 pm T: 12/25/2023 4:38:00 pm  JOB: [id number]/ [id number]

## 2023-12-26 ENCOUNTER — Encounter (HOSPITAL_COMMUNITY): Payer: Self-pay | Admitting: Oral Surgery

## 2023-12-30 ENCOUNTER — Encounter: Payer: Self-pay | Admitting: Physician Assistant

## 2023-12-30 ENCOUNTER — Ambulatory Visit: Payer: MEDICAID | Admitting: Physician Assistant

## 2023-12-30 ENCOUNTER — Other Ambulatory Visit: Payer: MEDICAID

## 2023-12-30 ENCOUNTER — Encounter: Payer: Self-pay | Admitting: Radiology

## 2023-12-30 DIAGNOSIS — M25542 Pain in joints of left hand: Secondary | ICD-10-CM

## 2023-12-30 NOTE — Progress Notes (Signed)
 Office Visit Note   Patient: Vanessa Morton           Date of Birth: Jul 10, 1985           MRN: [id number] Visit Date: 12/30/2023              Requested by: Lenon Nell SAILOR, FNP [address] [place],  [place] [address] PCP: Lenon Nell SAILOR, FNP   Assessment & Plan: Visit Diagnoses:  1. Pain in thumb joint with movement of left hand     Plan: Patient is 37 months since injuring her left thumb.  She thinks it was describes a hyperextension injury.  She did not think much of it but continued to have difficulty especially when flexing the thumb at the distal joint.  She works at [employer].  She was given a splint but did not find it immobilized her very well.  Here for further treatment findings consistent with an injury to the flexor tendon at the DIP joint.  It is now 37 months old.  Could get an MRI.  Not sure it being chronic and there is much more that could be done.  Will review her MRI with Dr. Walt when it is available  Follow-Up Instructions: Return if symptoms worsen or fail to improve.   Orders:  Orders Placed This Encounter  Procedures   XR Finger Thumb Left   MR HAND LEFT WO CONTRAST   No orders of the defined types were placed in this encounter.     Procedures: No procedures performed   Clinical Data: No additional findings.   Subjective: Chief Complaint  Patient presents with   Left Thumb - Fracture    HPI pleasant right-hand-dominant 38 year old woman comes in today with a 37-month history of left thumb pain at the end of the thumb she thinks this was from an injury where she jammed her thumb.  She is not quite sure of the mechanism.  Went to the ER was put in a splint which she finds not very helpful.  Her biggest complaint is the swelling and that she cannot bend her thumb  Review of Systems  All other systems reviewed and are negative.    Objective: Vital Signs: LMP  (LMP Unknown)   Physical Exam Constitutional:      Appearance: Normal  appearance.  Pulmonary:     Effort: Pulmonary effort is normal.  Neurological:     General: No focal deficit present.     Mental Status: She is alert and oriented to person, place, and time.  Psychiatric:        Mood and Affect: Mood normal.        Behavior: Behavior normal.     Ortho Exam Examination of her hand she has mild soft tissue swelling of the left thumb no tenderness to the proximal middle or distal phalanx.  She does have inability to flex at the DIP joint is able to do this easily on her other hand.  Brisk capillary refill Specialty Comments:  No specialty comments available.  Imaging: XR Finger Thumb Left Result Date: 12/30/2023 X-rays of her left thumb demonstrate subacute impacted fracture at the base of the distal phalanx also small avulsion    PMFS History: Patient Active Problem List   Diagnosis Date Noted   Drug allergy 11/16/2020   Abscess of back 10/20/2018   Axillary abscess 10/20/2018   Morbid obesity (HCC) 10/20/2018   Rash/skin eruption 10/20/2018   Past Medical History:  Diagnosis  Date   Abscess    Anxiety    Chest pain    Diabetes mellitus without complication (HCC)    no meds, does not check blood sugar   GERD (gastroesophageal reflux disease)    Gonorrhea    Hypertension    pt denies this dx - on HCTZ   IDA (iron deficiency anemia) 01/20/2023   hx iron infusions   Morbid obesity (HCC)    Obesity    Urinary tract infection     Family History  Problem Relation Age of Onset   Hypertension Mother    Diabetes Mother    Diabetes Other    Hypertension Other     Past Surgical History:  Procedure Laterality Date   CESAREAN SECTION     x 1   DIAGNOSTIC LAPAROSCOPY     hx laparoscopic sleeve gastrectomy   GASTRIC BYPASS  02/2022   [facility]   HYDRADENITIS EXCISION N/A 03/04/2019   Procedure: EXCISION HIDRADENITIS RIGHT AXILLA X2 AND BACK X2;  Surgeon: Curvin Deward MOULD, MD;  Location: [facility];  Service: General;  Laterality: N/A;    TONSILLECTOMY     TOOTH EXTRACTION N/A 04/03/2021   Procedure: DENTAL RESTORATION/EXTRACTIONS;  Surgeon: Sheryle Hamilton, DMD;  Location: [facility];  Service: Oral Surgery;  Laterality: N/A;   TOOTH EXTRACTION N/A 12/25/2023   Procedure: DENTAL RESTORATION/EXTRACTIONS;  Surgeon: Sheryle Hamilton, DMD;  Location[facility];  Service: Oral Surgery;  Laterality: N/A;   Social History   Occupational History   Not on file  Tobacco Use   Smoking status: Never   Smokeless tobacco: Never  Vaping Use   Vaping status: Never Used  Substance and Sexual Activity   Alcohol use: No   Drug use: No   Sexual activity: Yes    Birth control/protection: None

## 2024-01-15 ENCOUNTER — Encounter: Payer: Self-pay | Admitting: Physician Assistant

## 2024-01-17 ENCOUNTER — Emergency Department (HOSPITAL_COMMUNITY)
Admission: EM | Admit: 2024-01-17 | Discharge: 2024-01-17 | Disposition: A | Discharge status: Home / Self Care | Payer: MEDICAID

## 2024-01-17 ENCOUNTER — Other Ambulatory Visit: Payer: Self-pay

## 2024-01-17 ENCOUNTER — Encounter (HOSPITAL_COMMUNITY): Payer: Self-pay | Admitting: Emergency Medicine

## 2024-01-17 DIAGNOSIS — M79604 Pain in right leg: Secondary | ICD-10-CM | POA: Diagnosis not present

## 2024-01-17 DIAGNOSIS — M545 Low back pain, unspecified: Secondary | ICD-10-CM | POA: Insufficient documentation

## 2024-01-17 DIAGNOSIS — Y92410 Unspecified street and highway as the place of occurrence of the external cause: Secondary | ICD-10-CM | POA: Diagnosis not present

## 2024-01-17 DIAGNOSIS — M546 Pain in thoracic spine: Secondary | ICD-10-CM | POA: Diagnosis present

## 2024-01-17 DIAGNOSIS — M7918 Myalgia, other site: Secondary | ICD-10-CM

## 2024-01-17 MED ORDER — CYCLOBENZAPRINE HCL 10 MG PO TABS: 10.0000 mg | ORAL_TABLET | Freq: Two times a day (BID) | ORAL | 0 refills | Status: AC | PRN
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
## 2024-01-19 ENCOUNTER — Encounter: Payer: Self-pay | Admitting: Radiology

## 2024-01-20 ENCOUNTER — Encounter: Payer: Self-pay | Admitting: Physician Assistant

## 2024-01-24 ENCOUNTER — Ambulatory Visit
Admission: RE | Admit: 2024-01-24 | Discharge: 2024-01-24 | Disposition: A | Payer: MEDICAID | Source: Ambulatory Visit | Attending: Physician Assistant

## 2024-01-24 DIAGNOSIS — M25542 Pain in joints of left hand: Secondary | ICD-10-CM

## 2024-01-27 ENCOUNTER — Other Ambulatory Visit: Payer: Self-pay | Admitting: Nurse Practitioner

## 2024-01-27 DIAGNOSIS — R1030 Lower abdominal pain, unspecified: Secondary | ICD-10-CM

## 2024-01-28 ENCOUNTER — Encounter: Payer: Self-pay | Admitting: Nurse Practitioner

## 2024-02-02 ENCOUNTER — Other Ambulatory Visit: Payer: MEDICAID

## 2024-02-04 ENCOUNTER — Ambulatory Visit: Payer: MEDICAID | Admitting: Physician Assistant

## 2024-02-10 ENCOUNTER — Ambulatory Visit: Payer: MEDICAID | Admitting: Physician Assistant

## 2024-03-17 ENCOUNTER — Other Ambulatory Visit: Payer: Self-pay | Admitting: Surgery

## 2024-03-17 DIAGNOSIS — S29012A Strain of muscle and tendon of back wall of thorax, initial encounter: Secondary | ICD-10-CM

## 2024-03-17 DIAGNOSIS — S39012A Strain of muscle, fascia and tendon of lower back, initial encounter: Secondary | ICD-10-CM

## 2024-03-30 ENCOUNTER — Other Ambulatory Visit: Payer: Self-pay

## 2024-03-30 ENCOUNTER — Emergency Department (HOSPITAL_BASED_OUTPATIENT_CLINIC_OR_DEPARTMENT_OTHER)
Admission: EM | Admit: 2024-03-30 | Discharge: 2024-03-30 | Disposition: A | Discharge status: Home / Self Care | Payer: MEDICAID | Attending: Emergency Medicine | Admitting: Emergency Medicine

## 2024-03-30 ENCOUNTER — Encounter (HOSPITAL_BASED_OUTPATIENT_CLINIC_OR_DEPARTMENT_OTHER): Payer: Self-pay | Admitting: Emergency Medicine

## 2024-03-30 DIAGNOSIS — K029 Dental caries, unspecified: Secondary | ICD-10-CM | POA: Diagnosis not present

## 2024-03-30 DIAGNOSIS — K0889 Other specified disorders of teeth and supporting structures: Secondary | ICD-10-CM | POA: Diagnosis present

## 2024-03-30 NOTE — ED Provider Notes (Signed)
 " [place] [facility]ENC[facility] DEPARTMENT AT [facility] Provider Note   CSN: [id number] Arrival date & time: 03/30/24  1708     Patient presents with: Dental Pain   Vanessa Morton is a 39 y.o. female who presents to the ED today with primary concern of right-sided maxillary pain.  Previous maxillofacial surgery to remove multiple carious teeth in November 2025, current pain episode started within the last 2 to 3 days and she is describes it as persistent aching in the right upper jaw radiating up to the right ear.  Denies any systemic symptoms, no nausea, no vomiting, has not had any fevers, body aches, or chills.  She endorses that the right side of her face is mildly tender to palpation but she denies have any facial swelling.    Dental Pain      Prior to Admission medications  Medication Sig Start Date End Date Taking? Authorizing Provider  Buprenorphine  HCl-Naloxone  HCl (SUBOXONE ) 12-3 MG FILM Place 1.5 Film under the tongue every evening.    [provider]  clindamycin (CLEOCIN T) 1 % external solution Apply 1 application  topically 2 (two) times daily as needed (skin irritation). 02/22/21   [provider]  cyclobenzaprine  (FLEXERIL ) 10 MG tablet Take 1 tablet (10 mg total) by mouth 2 (two) times daily as needed for muscle spasms. 01/17/24   Geiple, Joshua, PA-C  furosemide  (LASIX ) 40 MG tablet Take 40 mg by mouth daily as needed (fluid retention).    [provider]  gabapentin  (NEURONTIN ) 400 MG capsule Take 400 mg by mouth 3 (three) times daily. 09/29/20   [provider]  ibuprofen  (ADVIL ) 600 MG tablet Take 1 tablet (600 mg total) by mouth every 6 (six) hours as needed. 10/03/23   Horton, Charmaine FALCON, MD  Multiple Vitamins-Minerals (MULTIVITAMIN WOMEN) TABS Take 1 tablet by mouth every evening.    [provider]  naloxone  (NARCAN ) nasal spray 4 mg/0.1 mL Place 1 spray into the nose as needed (opioid reversal). Patient not taking:  Reported on 12/25/2023    [provider]  nystatin  cream (MYCOSTATIN ) Apply 1 Application topically 2 (two) times daily as needed (skin irritation).    [provider]  oxybutynin (DITROPAN-XL) 10 MG 24 hr tablet Take 10 mg by mouth every evening.    [provider]  phentermine 15 MG capsule Take 15 mg by mouth every evening.    [provider]  polyethylene glycol powder (GLYCOLAX/MIRALAX) 17 GM/SCOOP powder Take 17 g by mouth daily as needed (constipation).    [provider]  zolpidem (AMBIEN) 10 MG tablet Take 10 mg by mouth daily. Take 1 tablet (10mg ) by mouth every morning (works night).    [provider]    Allergies: Amoxicillin , Cleocin [clindamycin], Nsaids, Ozempic (0.25 or 0.5 mg-dose) [semaglutide(0.25 or 0.5mg -dos)], and Trulicity [dulaglutide]    Review of Systems  HENT:  Positive for dental problem.   All other systems reviewed and are negative.   Updated Vital Signs BP (!) 132/100 (BP Location: Right Wrist)   Pulse (!) 101   Temp 97.9 F (36.6 C)   Resp 18   SpO2 98%   Physical Exam Vitals and nursing note reviewed.  Constitutional:      General: She is not in acute distress.    Appearance: Normal appearance.  HENT:     Head: Normocephalic and atraumatic.     Mouth/Throat:     Lips: Pink.     Mouth: Mucous membranes are  moist.     Dentition: Abnormal dentition. Dental caries present. No dental tenderness or dental abscesses.     Pharynx: Oropharynx is clear. Uvula midline.      Comments: A remaining molar on the right side of the maxilla, there is noted erosion of the crown with a visible dentin.  No appreciable facial swelling or abscess noted. Eyes:     Extraocular Movements: Extraocular movements intact.     Conjunctiva/sclera: Conjunctivae normal.     Pupils: Pupils are equal, round, and reactive to light.  Cardiovascular:     Rate and Rhythm: Normal rate and regular rhythm.     Pulses: Normal  pulses.     Heart sounds: Normal heart sounds. No murmur heard.    No friction rub. No gallop.  Pulmonary:     Effort: Pulmonary effort is normal.     Breath sounds: Normal breath sounds.  Abdominal:     General: Abdomen is flat. Bowel sounds are normal.     Palpations: Abdomen is soft.  Musculoskeletal:        General: Normal range of motion.     Cervical back: Normal range of motion and neck supple.     Right lower leg: No edema.     Left lower leg: No edema.  Skin:    General: Skin is warm and dry.     Capillary Refill: Capillary refill takes less than 2 seconds.  Neurological:     General: No focal deficit present.     Mental Status: She is alert and oriented to person, place, and time. Mental status is at baseline.     GCS: GCS eye subscore is 4. GCS verbal subscore is 5. GCS motor subscore is 6.  Psychiatric:        Mood and Affect: Mood normal.     (all labs ordered are listed, but only abnormal results are displayed) Labs Reviewed - No data to display  EKG: None  Radiology: No results found.   Procedures   Medications Ordered in the ED - No data to display                                  Medical Decision Making  Based on the patient's presenting signs and symptoms as well as the physical examination, differential includes potential dental infection/abscess, periodontitis, acute sinusitis.  Physical examination does not demonstrate any dental infection or abscess presently, and the gingiva appear normal.  There is no tenderness to palpation of the sinuses.  There is appreciable erosion of the crown of the remaining right upper molar making possible dental origin of pain likely.  She has been using naproxen  intermittently with moderate pain relief.  Plan at this time is to have her visit dentist for repair of carious right maxillary molar, and continue to use acetaminophen  and naproxen  for pain management.  Careful return precautions given which she verbalized  understanding and agreement and has no further concerns at this time.  Patient did request refill of previously prescribed Suboxone , it appears that the last time that this was filled was in July 2025, she has not had a follow-up visit with pain management since.  Thus we will not provide refill her prescription of this medication at this time and refer to pain management for further prescriptions of same.     Final diagnoses:  Dental caries    ED Discharge Orders  None          Myriam Dorn BROCKS, [place] 03/30/24 1940    Jerrol Agent, MD 03/30/24 1953  "

## 2024-03-30 NOTE — ED Triage Notes (Signed)
 Reports right sided dental pain since end of December. Dental surgery in November. Denies fevers.

## 2024-03-30 NOTE — Discharge Instructions (Signed)
 As discussed, it is importantly follow-up with a dentist for continued evaluation of your affected tooth, continue pain medication as discussed, using Tylenol  and Aleve  in alternation.

## 2024-03-31 ENCOUNTER — Emergency Department (HOSPITAL_COMMUNITY)
Admission: EM | Admit: 2024-03-31 | Discharge: 2024-04-01 | Disposition: A | Discharge status: Home / Self Care | Payer: MEDICAID | Attending: Emergency Medicine | Admitting: Emergency Medicine

## 2024-03-31 DIAGNOSIS — F1123 Opioid dependence with withdrawal: Secondary | ICD-10-CM | POA: Insufficient documentation

## 2024-03-31 DIAGNOSIS — F1193 Opioid use, unspecified with withdrawal: Secondary | ICD-10-CM

## 2024-03-31 DIAGNOSIS — F419 Anxiety disorder, unspecified: Secondary | ICD-10-CM | POA: Diagnosis not present

## 2024-03-31 DIAGNOSIS — Z760 Encounter for issue of repeat prescription: Secondary | ICD-10-CM | POA: Diagnosis not present

## 2024-03-31 NOTE — ED Triage Notes (Signed)
 Complaining of having withdrawal symptoms from suboxone  that she has not had in 4 days. She is having hot and cold flashes, not wanting to eat. Ect.
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
## 2024-04-01 ENCOUNTER — Ambulatory Visit (HOSPITAL_COMMUNITY)
Admission: EM | Admit: 2024-04-01 | Discharge: 2024-04-01 | Disposition: A | Discharge status: Home / Self Care | Payer: MEDICAID | Source: Home / Self Care

## 2024-04-01 DIAGNOSIS — F1123 Opioid dependence with withdrawal: Secondary | ICD-10-CM | POA: Insufficient documentation

## 2024-04-01 DIAGNOSIS — Z760 Encounter for issue of repeat prescription: Secondary | ICD-10-CM | POA: Insufficient documentation

## 2024-04-01 DIAGNOSIS — F419 Anxiety disorder, unspecified: Secondary | ICD-10-CM | POA: Insufficient documentation

## 2024-04-01 DIAGNOSIS — F1193 Opioid use, unspecified with withdrawal: Secondary | ICD-10-CM

## 2024-04-01 MED ORDER — ONDANSETRON HCL 4 MG PO TABS: 4.0000 mg | ORAL_TABLET | Freq: Four times a day (QID) | ORAL | 0 refills | Status: DC

## 2024-04-01 MED ORDER — CLONIDINE HCL 0.2 MG PO TABS: 0.2000 mg | ORAL_TABLET | Freq: Two times a day (BID) | ORAL | 0 refills | Status: DC

## 2024-04-01 MED ORDER — CLONIDINE HCL 0.1 MG PO TABS: 0.1000 mg | ORAL_TABLET | Freq: Once | ORAL | Status: DC

## 2024-04-01 MED ORDER — CLONIDINE HCL 0.2 MG PO TABS: 0.2000 mg | ORAL_TABLET | Freq: Two times a day (BID) | ORAL | 0 refills | Status: AC

## 2024-04-01 MED ORDER — ONDANSETRON 4 MG PO TBDP: 4.0000 mg | ORAL_TABLET | Freq: Once | ORAL | Status: DC

## 2024-04-01 MED ORDER — ONDANSETRON HCL 4 MG PO TABS: 4.0000 mg | ORAL_TABLET | Freq: Four times a day (QID) | ORAL | 0 refills | Status: AC

## 2024-04-01 NOTE — Progress Notes (Signed)
" °   04/01/24 0055  [facility] Triage Screening (Walk-ins at [facility] only)  How Did You Hear About Us ? Other (Comment)  What Is the Reason for Your Visit/Call Today? Vanessa Morton is a 39 year old female presenting as a voluntary walk-in to [facility] requesting a prescription for suboxone . Patient denied SI, HI, psychosis and alcohol/drug usage. Patient reports I am not feeling well. Patient reports she has been off her suboxone  strips since Saturday. Patient reports her mother has them in [place], [place] and told her that it is against the law to mail the suboxone  strips therefore she needs another prescription.  How Long Has This Been Causing You Problems? 1 wk - 1 month  Have You Recently Had Any Thoughts About Hurting Yourself? No  Are You Planning to Commit Suicide/Harm Yourself At This time? No  Have you Recently Had Thoughts About Hurting Someone [place]? No  Are You Planning To Harm Someone At This Time? No  Physical Abuse Denies  Verbal Abuse Denies  Sexual Abuse Denies  Exploitation of patient/patient's resources Denies  Self-Neglect Denies  Possible abuse reported to:  (n/a)  Are you currently experiencing any auditory, visual or other hallucinations? No  Have You Used Any Alcohol or Drugs in the Past 24 Hours? No  Do you have any current medical co-morbidities that require immediate attention? No  Clinician description of patient physical appearance/behavior: neat / cooperative  What Do You Feel Would Help You the Most Today? Alcohol or Drug Use Treatment  If access to [facility] Urgent Care was not available, would you have sought care in the Emergency Department? Yes (patient was seen at [facility] ED prior to coming to [facility])  Determination of Need Routine (7 days)  Options For Referral Other: Comment    Flowsheet Row ED from 04/01/2024 in [facility] ED from 03/31/2024 in [facility] Emergency Department at [facility] ED from 03/30/2024 in [facility] Emergency  Department at [facility]  C-SSRS RISK CATEGORY No Risk No Risk No Risk    "

## 2024-04-01 NOTE — ED Notes (Signed)
 Pt left before getting discharge paperwork and meds. This RN attempted to ask pt to wait while papers are printed and pt states I am leaving because theres no points. Charge RN made aware

## 2024-04-01 NOTE — Discharge Instructions (Addendum)
  Discharge recommendations:  Patient is to take medications as prescribed. Please follow up with your primary care provider for all medical related needs.   Therapy: We recommend that patient participate in individual therapy to address mental health concerns.  Medications: The patient or guardian is to contact a medical professional and/or outpatient provider to address any new side effects that develop. The patient or guardian should update outpatient providers of any new medications and/or medication changes.   Safety:  The patient should abstain from use of illicit substances/drugs and abuse of any medications. If symptoms worsen or do not continue to improve or if the patient becomes actively suicidal or homicidal then it is recommended that the patient return to the closest hospital emergency department, the [facility], or call 911 for further evaluation and treatment. National Suicide Prevention Lifeline 1-800-SUICIDE or 614-408-7651.  About 988 988 offers 24/7 access to trained crisis counselors who can help people experiencing mental health-related distress. People can call or text 988 or chat 988lifeline.org for themselves or if they are worried about a loved one who may need crisis support.  Crisis Mobile: [employer]:                     7700405147 (for crisis response 24 hours a day) [facility] Hotline:                                            941-311-7749

## 2024-04-01 NOTE — ED Provider Notes (Signed)
 [facility]al [facility] Medical Screening Exam  Patient Name: Vanessa Morton MRN: [id number] Date of Evaluation: 04/01/24 Chief Complaint:   I need to refill my suboxone .  Diagnosis:  Final diagnoses:  Encounter for medication refill  Acute narcotic withdrawal without complication (HCC)    History of Present illness: Vanessa Morton is a 39 y.o. female.  With psychiatric history of opioid use disorder, on medication assisted therapy, and anxiety, who presented voluntarily as a walk-in to [facility] requesting a prescription refill for Suboxone .  Patient was seen face-to-face by this provider and chart reviewed. Per chart review, patient just left [facility] where she presented with a chief complaint of withdrawal symptoms from Suboxone  after leaving it at her mother's house when she traveled out of town. Last use 6 days ago. She reports her mother is unable to send her medications back, but unable to explain why she is not able to go get her medications. Per PDMP review, patient filled 45 tabs of Suboxone  on 03/16/2024. Patient was evaluated at the ED and her COWS score was a 5.  Supportive care medications including clonidine  and Zofran 's was recommended with follow-up with primary care doctor within 72 hours.   At the [facility] tonight, Patient reports her withdrawal symptoms as anxiety and some mild diffuse discomfort. She is unable to remember the name of her prescriber.   Current COWS assessment score is a 4.  Vital signs stable.   Discussed with patient inability to restart her Suboxone  or give her a prescription at this time and recommend follow up with her outpatient prescriber following guidelines of the [place] Stop Act.[place] (prohibition of a secondary prescription for this medication).   At this time, patient can be safely discharged with recommendation for follow-up with her outpatient psychiatric Suboxone  provider or at [facility].  Patient is provided with opportunity for questions.  She  verbalized understanding and is in agreement.  On evaluation, patient is alert, oriented x 3, and cooperative. Speech is clear and coherent. Pt appears casually dressed. Eye contact is good. Mood is anxious, affect is congruent with mood. Thought process is coherent and thought content is WDL. Pt denies SI/HI/AVH. There is no objective indication that the patient is responding to internal stimuli. No delusions elicited during this assessment.     Flowsheet Row ED from 04/01/2024 in [facility] ED from 03/31/2024 in [facility] Emergency Department at [facility] ED from 03/30/2024 in [facility] Emergency Department at [facility]  C-SSRS RISK CATEGORY No Risk No Risk No Risk    Psychiatric Specialty Exam  Presentation  General Appearance:Casual  Eye Contact:Good  Speech:Clear and Coherent  Speech Volume:Normal  Handedness:Right   Mood and Affect  Mood: Anxious  Affect: Congruent   Thought Process  Thought Processes: Coherent  Descriptions of Associations:Intact  Orientation:Full (Time, Place and Person)  Thought Content:WDL    Hallucinations:None  Ideas of Reference:None  Suicidal Thoughts:No  Homicidal Thoughts:No   Sensorium  Memory: Immediate Good  Judgment: Intact  Insight: Present   Executive Functions  Concentration: Good  Attention Span: Good  Recall: Good  Fund of Knowledge: Good  Language: Good   Psychomotor Activity  Psychomotor Activity: Normal   Assets  Assets: Communication Skills; Desire for Improvement   Sleep  Sleep: Fair  Number of hours: No data recorded  Physical Exam: Physical Exam Constitutional:      General: She is not in acute distress.    Appearance: She is not diaphoretic.  HENT:     Nose: No congestion.  Cardiovascular:     Rate and Rhythm: Normal rate.  Pulmonary:     Effort: No respiratory distress.  Chest:     Chest wall: No tenderness.   Neurological:     Mental Status: She is alert and oriented to person, place, and time.  Psychiatric:        Attention and Perception: Attention and perception normal.        Mood and Affect: Mood is anxious. Affect is blunt.        Speech: Speech normal.        Behavior: Behavior is cooperative.        Thought Content: Thought content normal.    Review of Systems  Constitutional: Negative.   HENT:  Negative for congestion.   Eyes:  Negative for discharge.  Respiratory:  Negative for cough, shortness of breath and wheezing.   Cardiovascular:  Negative for chest pain and palpitations.  Gastrointestinal:  Negative for diarrhea, nausea and vomiting.  Neurological:  Negative for dizziness, seizures and headaches.  Psychiatric/Behavioral:  The patient is nervous/anxious.    Blood pressure 104/80, pulse 99, temperature 97.8 F (36.6 C), temperature source Oral, resp. rate 19, SpO2 100%. There is no height or weight on file to calculate BMI.  Musculoskeletal: Strength & Muscle Tone: within normal limits Gait & Station: normal Patient leans: N/A   [facility] MSE Discharge Disposition for Follow up and Recommendations: Based on my evaluation the patient does not appear to have an emergency medical condition and can be discharged with resources and follow up care in outpatient services for Medication Management  Patient denies SI/HI/AVH or paranoia.  Patient does not meet inpatient psychiatric admission criteria or IVC criteria at this time.  There is no evidence of imminent risk of harm to self or others.  COWS score is at 4. No distress noted. VSS.   Discussed recommendation for supportive care and follow-up with her outpatient prescriber or [facility] in the morning. Resources provided.   Discharge recommendations:  Patient is to take medications as prescribed. Please follow up with your primary care provider for all medical related needs.   Therapy: We recommend that patient participate  in individual therapy to address mental health concerns.  Medications: The patient or guardian is to contact a medical professional and/or outpatient provider to address any new side effects that develop. The patient or guardian should update outpatient providers of any new medications and/or medication changes.   Safety:  The patient should abstain from use of illicit substances/drugs and abuse of any medications. If symptoms worsen or do not continue to improve or if the patient becomes actively suicidal or homicidal then it is recommended that the patient return to the closest hospital emergency department, the [facility], or call 911 for further evaluation and treatment. National Suicide Prevention Lifeline 1-800-SUICIDE or 314-292-7020.  About 988 988 offers 24/7 access to trained crisis counselors who can help people experiencing mental health-related distress. People can call or text 988 or chat 988lifeline.org for themselves or if they are worried about a loved one who may need crisis support.  Crisis Mobile: [facility]:                     334 512 4140 (for crisis response 24 hours a day) [facility] Hotline:  613-152-2054   Patient discharged in stable condition.  Thurman LULLA Ivans, NP 04/01/2024, 2:07 AM

## 2024-04-01 NOTE — ED Provider Notes (Signed)
 " [facility] Provider Note   CSN: [id number] Arrival date & time: 03/31/24  1940     History Chief Complaint  Patient presents with   Withdrawal    HPI Vanessa Morton is a 39 y.o. female presenting for chief complaint of withdrawal symptoms. States that she is withdrawing from suboxone . Follows with pain medicine clinic for suboxone . Patient's recorded medical, surgical, social, medication list and allergies were reviewed in the Snapshot window as part of the initial history.   Review of Systems   Review of Systems  Constitutional:  Positive for diaphoresis and fatigue. Negative for chills and fever.  HENT:  Negative for ear pain and sore throat.   Eyes:  Negative for pain and visual disturbance.  Respiratory:  Negative for cough and shortness of breath.   Cardiovascular:  Negative for chest pain and palpitations.  Gastrointestinal:  Positive for nausea. Negative for abdominal pain and vomiting.  Genitourinary:  Negative for dysuria and hematuria.  Musculoskeletal:  Positive for myalgias. Negative for arthralgias and back pain.  Skin:  Negative for color change and rash.  Neurological:  Negative for seizures and syncope.  All other systems reviewed and are negative.   Physical Exam Updated Vital Signs BP (!) 135/94   Pulse 71   Temp 98.5 F (36.9 C) (Oral)   Resp 14   SpO2 100%  Physical Exam Vitals and nursing note reviewed.  Constitutional:      General: She is not in acute distress.    Appearance: She is well-developed.  HENT:     Head: Normocephalic and atraumatic.  Eyes:     Conjunctiva/sclera: Conjunctivae normal.  Cardiovascular:     Rate and Rhythm: Normal rate and regular rhythm.     Heart sounds: No murmur heard. Pulmonary:     Effort: Pulmonary effort is normal. No respiratory distress.     Breath sounds: Normal breath sounds.  Abdominal:     General: There is no distension.     Palpations: Abdomen is  soft.     Tenderness: There is no abdominal tenderness. There is no right CVA tenderness or left CVA tenderness.  Musculoskeletal:        General: No swelling or tenderness. Normal range of motion.     Cervical back: Neck supple.  Skin:    General: Skin is warm and dry.  Neurological:     General: No focal deficit present.     Mental Status: She is alert and oriented to person, place, and time. Mental status is at baseline.     Cranial Nerves: No cranial nerve deficit.      ED Course/ Medical Decision Making/ A&P    Procedures Procedures   Medications Ordered in ED Medications  cloNIDine  (CATAPRES ) tablet 0.1 mg (has no administration in time range)  ondansetron  (ZOFRAN -ODT) disintegrating tablet 4 mg (has no administration in time range)    Medical Decision Making:     This is a 39 year old female reporting withdrawal from Suboxone  after leaving it with her family member out of town traveling. Currently her COWS score is only 5. She is in no acute distress at this time.  She is already been 6 days without the medication.  Discussed that she is likely on the tail end of her withdrawal.  Based on her COWS score and presentation.  I believe the patient can be safely managed with more conservative options.  Patient is insistent on having her Suboxone  restarted immediately.  Discussed with patient that pharmacy policy prohibits dispensation of Suboxone  here in the emergency department for chronic users.  (Have had this conversation with pharmacy multiple times in the last year) In addition, patient already has a prescriber for this medicine and [place] stop act would prohibit a secondary prescription for this medication.  However she is already through the worst of the withdrawal symptoms it appears based on her current presentation.  Will start patient on supportive care medication with clonidine  and Zofran  as needed for symptoms and recommend follow-up with a primary care doctor within 72  hours.  Clinical Impression:  1. Narcotic withdrawal [facility])      Discharge   Final Clinical Impression(s) / ED Diagnoses Final diagnoses:  Narcotic withdrawal (HCC)    Rx / DC Orders ED Discharge Orders          Ordered    cloNIDine  (CATAPRES ) 0.2 MG tablet  2 times daily,   Status:  Discontinued        04/01/24 0006    ondansetron  (ZOFRAN ) 4 MG tablet  Every 6 hours,   Status:  Discontinued        04/01/24 0006    cloNIDine  (CATAPRES ) 0.2 MG tablet  2 times daily,   Status:  Discontinued        04/01/24 0007    ondansetron  (ZOFRAN ) 4 MG tablet  Every 6 hours        04/01/24 0007    cloNIDine  (CATAPRES ) 0.2 MG tablet  2 times daily        04/01/24 0014              Jerral Meth, MD 04/01/24 0014  "

## 2024-04-02 ENCOUNTER — Other Ambulatory Visit: Payer: MEDICAID
